# Patient Record
Sex: Male | Born: 1975
Health system: Southern US, Community
[De-identification: ages and names within clinical notes are randomized; demographics above are authoritative.]

## PROBLEM LIST (undated history)

## (undated) DIAGNOSIS — F1910 Other psychoactive substance abuse, uncomplicated: Secondary | ICD-10-CM

## (undated) DIAGNOSIS — K649 Unspecified hemorrhoids: Secondary | ICD-10-CM

## (undated) DIAGNOSIS — M1612 Unilateral primary osteoarthritis, left hip: Secondary | ICD-10-CM

## (undated) DIAGNOSIS — F4310 Post-traumatic stress disorder, unspecified: Secondary | ICD-10-CM

## (undated) DIAGNOSIS — F419 Anxiety disorder, unspecified: Secondary | ICD-10-CM

## (undated) DIAGNOSIS — F1110 Opioid abuse, uncomplicated: Secondary | ICD-10-CM

## (undated) DIAGNOSIS — G8929 Other chronic pain: Secondary | ICD-10-CM

## (undated) DIAGNOSIS — Z87898 Personal history of other specified conditions: Secondary | ICD-10-CM

## (undated) DIAGNOSIS — L749 Eccrine sweat disorder, unspecified: Secondary | ICD-10-CM

## (undated) DIAGNOSIS — M25559 Pain in unspecified hip: Secondary | ICD-10-CM

## (undated) DIAGNOSIS — M1990 Unspecified osteoarthritis, unspecified site: Secondary | ICD-10-CM

## (undated) DIAGNOSIS — I10 Essential (primary) hypertension: Secondary | ICD-10-CM

---

## 2004-08-29 ENCOUNTER — Emergency Department (HOSPITAL_COMMUNITY): Admission: AC | Admit: 2004-08-29 | Discharge: 2004-08-29 | Payer: Self-pay | Admitting: *Deleted

## 2009-12-26 ENCOUNTER — Emergency Department (HOSPITAL_BASED_OUTPATIENT_CLINIC_OR_DEPARTMENT_OTHER): Admission: EM | Admit: 2009-12-26 | Discharge: 2009-12-26 | Payer: Self-pay | Admitting: Emergency Medicine

## 2013-12-19 ENCOUNTER — Encounter (HOSPITAL_BASED_OUTPATIENT_CLINIC_OR_DEPARTMENT_OTHER): Payer: Self-pay | Admitting: Emergency Medicine

## 2013-12-19 ENCOUNTER — Emergency Department (HOSPITAL_BASED_OUTPATIENT_CLINIC_OR_DEPARTMENT_OTHER)
Admission: EM | Admit: 2013-12-19 | Discharge: 2013-12-19 | Disposition: A | Discharge status: Home / Self Care | Payer: Self-pay | Attending: Emergency Medicine | Admitting: Emergency Medicine

## 2013-12-19 DIAGNOSIS — K645 Perianal venous thrombosis: Secondary | ICD-10-CM | POA: Insufficient documentation

## 2013-12-19 DIAGNOSIS — F17200 Nicotine dependence, unspecified, uncomplicated: Secondary | ICD-10-CM | POA: Insufficient documentation

## 2013-12-19 DIAGNOSIS — Z880 Allergy status to penicillin: Secondary | ICD-10-CM | POA: Insufficient documentation

## 2013-12-19 MED ORDER — LIDOCAINE-EPINEPHRINE (PF) 2 %-1:200000 IJ SOLN
10.0000 mL | Freq: Once | INTRAMUSCULAR | Status: AC
  Administered 2013-12-19: 10 mL
  Filled 2013-12-19: qty 10

## 2013-12-19 MED ORDER — HYDROCODONE-ACETAMINOPHEN 5-325 MG PO TABS: 1.0000 | ORAL_TABLET | Freq: Four times a day (QID) | ORAL | Status: DC | PRN

## 2013-12-19 MED ORDER — LIDOCAINE (ANORECTAL) 5 % EX CREA: TOPICAL_CREAM | CUTANEOUS | Status: DC

## 2013-12-19 MED ORDER — LIDOCAINE HCL 4 % EX SOLN
CUTANEOUS | Status: DC | PRN
  Administered 2013-12-19: 04:00:00 via TOPICAL
  Filled 2013-12-19: qty 50

## 2013-12-19 NOTE — ED Notes (Signed)
Abscess on rectum x 4 days,  Half dollar side

## 2013-12-19 NOTE — ED Provider Notes (Signed)
CSN: [id number]     Arrival date & time 12/19/13  0225 History   First MD Initiated Contact with Patient 12/19/13 0256     Chief Complaint  Patient presents with  . Abscess   (Consider location/radiation/quality/duration/timing/severity/associated sxs/prior Treatment) HPI This is a 38 year old male with a four-day history of a tender perianal mass. It is worsening in size and is very tender. The pain is moderate to severe, worse with sitting or moving his bowels. The pain is severe enough this morning that made him vomit. He denies history of hemorrhoids in the past. He has had some chills but no fever.  History reviewed. No pertinent past medical history. History reviewed. No pertinent past surgical history. History reviewed. No pertinent family history. History  Substance Use Topics  . Smoking status: Current Every Day Smoker  . Smokeless tobacco: Not on file  . Alcohol Use: No    Review of Systems  All other systems reviewed and are negative.    Allergies  Penicillins  Home Medications  No current outpatient prescriptions on file. BP 183/79  Pulse 112  Temp(Src) 98.2 F (36.8 C) (Oral)  Resp 18  Ht 5\' 6"  (1.676 m)  Wt 280 lb (127.007 kg)  BMI 45.21 kg/m2  SpO2 100%  Physical Exam General: Well-developed, well-nourished male in no acute distress; appearance consistent with age of record HENT: normocephalic; atraumatic Eyes: pupils equal, round and reactive to light; extraocular muscles intact Neck: supple Heart: regular rate and rhythm Lungs: clear to auscultation bilaterally Abdomen: soft; nondistended; nontender; bowel sounds present Rectal: Thrombosed hemorrhoid at the 9:00 position, about 1.5 x 2 cm Extremities: No deformity; full range of motion Neurologic: Awake, alert and oriented; motor function intact in all extremities and symmetric; no facial droop Skin: Warm and dry Psychiatric: Anxious    ED Course  Procedures (including critical care  time)  INCISION AND DRAINAGE Performed by: Hanley SeamenMOLPUS,[employer] Null L Consent: Verbal consent obtained. Risks and benefits: risks, benefits and alternatives were discussed Type: Thrombosed hemorrhoid   Body area: Anus  Anesthesia: local infiltration  Local anesthetic: lidocaine 2 % with epinephrine  Anesthetic total: 3 ml  Incision was made with a scalpel, followed by removal of ovoid section of hemorrhoids roof with scissors  Complexity: simple Blunt dissection to break up loculations  Drainage: Clot   Drainage amount: Moderate   Packing material: None   Patient tolerance: Patient tolerated the procedure well with no immediate complications.     MDM      Hanley SeamenJohn L Rachael Ferrie, MD 12/19/13 [id number]

## 2013-12-19 NOTE — ED Notes (Signed)
Abscess on rectum x 4 days

## 2014-09-17 ENCOUNTER — Emergency Department (HOSPITAL_BASED_OUTPATIENT_CLINIC_OR_DEPARTMENT_OTHER): Payer: Self-pay

## 2014-09-17 ENCOUNTER — Encounter (HOSPITAL_BASED_OUTPATIENT_CLINIC_OR_DEPARTMENT_OTHER): Payer: Self-pay

## 2014-09-17 ENCOUNTER — Emergency Department (HOSPITAL_BASED_OUTPATIENT_CLINIC_OR_DEPARTMENT_OTHER)
Admission: EM | Admit: 2014-09-17 | Discharge: 2014-09-17 | Disposition: A | Discharge status: Home / Self Care | Payer: Self-pay | Attending: Emergency Medicine | Admitting: Emergency Medicine

## 2014-09-17 DIAGNOSIS — S99911A Unspecified injury of right ankle, initial encounter: Secondary | ICD-10-CM

## 2014-09-17 DIAGNOSIS — Y990 Civilian activity done for income or pay: Secondary | ICD-10-CM | POA: Insufficient documentation

## 2014-09-17 DIAGNOSIS — X58XXXA Exposure to other specified factors, initial encounter: Secondary | ICD-10-CM | POA: Insufficient documentation

## 2014-09-17 DIAGNOSIS — S93601A Unspecified sprain of right foot, initial encounter: Secondary | ICD-10-CM | POA: Insufficient documentation

## 2014-09-17 DIAGNOSIS — Z880 Allergy status to penicillin: Secondary | ICD-10-CM | POA: Insufficient documentation

## 2014-09-17 DIAGNOSIS — Y92321 Football field as the place of occurrence of the external cause: Secondary | ICD-10-CM | POA: Insufficient documentation

## 2014-09-17 DIAGNOSIS — Z720 Tobacco use: Secondary | ICD-10-CM | POA: Insufficient documentation

## 2014-09-17 DIAGNOSIS — Y9361 Activity, american tackle football: Secondary | ICD-10-CM | POA: Insufficient documentation

## 2014-09-17 DIAGNOSIS — S93401A Sprain of unspecified ligament of right ankle, initial encounter: Secondary | ICD-10-CM | POA: Insufficient documentation

## 2014-09-17 MED ORDER — HYDROCODONE-ACETAMINOPHEN 5-325 MG PO TABS: 1.0000 | ORAL_TABLET | ORAL | Status: DC | PRN

## 2014-09-17 NOTE — ED Notes (Signed)
Pt presented to the ED with a right ankle injury. Pt stated that he works for [employer] and was playing football with the kids, he went to catch a ball and came down on his foot wrong and stated that he heard a pop about a 3 times in his right ankle.

## 2014-09-17 NOTE — Discharge Instructions (Signed)
Rest.  Ice for 20 minutes every 2 hours while awake for the next 2 days.  Wear Ace bandage for comfort and support.  Follow-up with your primary Dr. If not improving in the next week.   Ankle Sprain An ankle sprain is an injury to the strong, fibrous tissues (ligaments) that hold the bones of your ankle joint together.  CAUSES An ankle sprain is usually caused by a fall or by twisting your ankle. Ankle sprains most commonly occur when you step on the outer edge of your foot, and your ankle turns inward. People who participate in sports are more prone to these types of injuries.  SYMPTOMS   Pain in your ankle. The pain may be present at rest or only when you are trying to stand or walk.  Swelling.  Bruising. Bruising may develop immediately or within 1 to 2 days after your injury.  Difficulty standing or walking, particularly when turning corners or changing directions. DIAGNOSIS  Your caregiver will ask you details about your injury and perform a physical exam of your ankle to determine if you have an ankle sprain. During the physical exam, your caregiver will press on and apply pressure to specific areas of your foot and ankle. Your caregiver will try to move your ankle in certain ways. An X-ray exam may be done to be sure a bone was not broken or a ligament did not separate from one of the bones in your ankle (avulsion fracture).  TREATMENT  Certain types of braces can help stabilize your ankle. Your caregiver can make a recommendation for this. Your caregiver may recommend the use of medicine for pain. If your sprain is severe, your caregiver may refer you to a surgeon who helps to restore function to parts of your skeletal system (orthopedist) or a physical therapist. HOME CARE INSTRUCTIONS   Apply ice to your injury for 1-2 days or as directed by your caregiver. Applying ice helps to reduce inflammation and pain.  Put ice in a plastic bag.  Place a towel between your skin and the  bag.  Leave the ice on for 15-20 minutes at a time, every 2 hours while you are awake.  Only take over-the-counter or prescription medicines for pain, discomfort, or fever as directed by your caregiver.  Elevate your injured ankle above the level of your heart as much as possible for 2-3 days.  If your caregiver recommends crutches, use them as instructed. Gradually put weight on the affected ankle. Continue to use crutches or a cane until you can walk without feeling pain in your ankle.  If you have a plaster splint, wear the splint as directed by your caregiver. Do not rest it on anything harder than a pillow for the first 24 hours. Do not put weight on it. Do not get it wet. You may take it off to take a shower or bath.  You may have been given an elastic bandage to wear around your ankle to provide support. If the elastic bandage is too tight (you have numbness or tingling in your foot or your foot becomes cold and blue), adjust the bandage to make it comfortable.  If you have an air splint, you may blow more air into it or let air out to make it more comfortable. You may take your splint off at night and before taking a shower or bath. Wiggle your toes in the splint several times per day to decrease swelling. SEEK MEDICAL CARE IF:   You  have rapidly increasing bruising or swelling.  Your toes feel extremely cold or you lose feeling in your foot.  Your pain is not relieved with medicine. SEEK IMMEDIATE MEDICAL CARE IF:  Your toes are numb or blue.  You have severe pain that is increasing. MAKE SURE YOU:   Understand these instructions.  Will watch your condition.  Will get help right away if you are not doing well or get worse. Document Released: 11/01/2005 Document Revised: 07/26/2012 Document Reviewed: 11/13/2011 [facility] Patient Information 2015 [place], [place]. This information is not intended to replace advice given to you by your health care provider. Make sure you  discuss any questions you have with your health care provider.  Foot Sprain The muscles and cord like structures which attach muscle to bone (tendons) that surround the feet are made up of units. A foot sprain can occur at the weakest spot in any of these units. This condition is most often caused by injury to or overuse of the foot, as from playing contact sports, or aggravating a previous injury, or from poor conditioning, or obesity. SYMPTOMS  Pain with movement of the foot.  Tenderness and swelling at the injury site.  Loss of strength is present in moderate or severe sprains. THE THREE GRADES OR SEVERITY OF FOOT SPRAIN ARE:  Mild (Grade I): Slightly pulled muscle without tearing of muscle or tendon fibers or loss of strength.  Moderate (Grade II): Tearing of fibers in a muscle, tendon, or at the attachment to bone, with small decrease in strength.  Severe (Grade III): Rupture of the muscle-tendon-bone attachment, with separation of fibers. Severe sprain requires surgical repair. Often repeating (chronic) sprains are caused by overuse. Sudden (acute) sprains are caused by direct injury or over-use. DIAGNOSIS  Diagnosis of this condition is usually by your own observation. If problems continue, a caregiver may be required for further evaluation and treatment. X-rays may be required to make sure there are not breaks in the bones (fractures) present. Continued problems may require physical therapy for treatment. PREVENTION  Use strength and conditioning exercises appropriate for your sport.  Warm up properly prior to working out.  Use athletic shoes that are made for the sport you are participating in.  Allow adequate time for healing. Early return to activities makes repeat injury more likely, and can lead to an unstable arthritic foot that can result in prolonged disability. Mild sprains generally heal in 3 to 10 days, with moderate and severe sprains taking 2 to 10 weeks. Your  caregiver can help you determine the proper time required for healing. HOME CARE INSTRUCTIONS   Apply ice to the injury for 15-20 minutes, 03-04 times per day. Put the ice in a plastic bag and place a towel between the bag of ice and your skin.  An elastic wrap (like an Ace bandage) may be used to keep swelling down.  Keep foot above the level of the heart, or at least raised on a footstool, when swelling and pain are present.  Try to avoid use other than gentle range of motion while the foot is painful. Do not resume use until instructed by your caregiver. Then begin use gradually, not increasing use to the point of pain. If pain does develop, decrease use and continue the above measures, gradually increasing activities that do not cause discomfort, until you gradually achieve normal use.  Use crutches if and as instructed, and for the length of time instructed.  Keep injured foot and ankle wrapped between  treatments.  Massage foot and ankle for comfort and to keep swelling down. Massage from the toes up towards the knee.  Only take over-the-counter or prescription medicines for pain, discomfort, or fever as directed by your caregiver. SEEK IMMEDIATE MEDICAL CARE IF:   Your pain and swelling increase, or pain is not controlled with medications.  You have loss of feeling in your foot or your foot turns cold or blue.  You develop new, unexplained symptoms, or an increase of the symptoms that brought you to your caregiver. MAKE SURE YOU:   Understand these instructions.  Will watch your condition.  Will get help right away if you are not doing well or get worse. Document Released: 04/23/2002 Document Revised: 01/24/2012 Document Reviewed: 06/20/2008 [facility] Patient Information 2015 [place], [place]. This information is not intended to replace advice given to you by your health care provider. Make sure you discuss any questions you have with your health care provider.

## 2014-09-17 NOTE — ED Provider Notes (Signed)
CSN: [id number]     Arrival date & time 09/17/14  1024 History   First MD Initiated Contact with Patient 09/17/14 1147     Chief Complaint  Patient presents with  . Ankle Injury     (Consider location/radiation/quality/duration/timing/severity/associated sxs/prior Treatment) HPI Comments: Patient is a 38 year old male who presents with complaints of right foot pain. He was playing football with children when he twisted his foot awkwardly. He states he heard a pop and has had difficulty ambulating since this time.  Patient is a 38 y.o. male presenting with lower extremity injury. The history is provided by the patient.  Ankle Injury This is a new problem. The current episode started 1 to 2 hours ago. The problem occurs constantly. The problem has not changed since onset.The symptoms are aggravated by walking. The symptoms are relieved by rest. He has tried nothing for the symptoms. The treatment provided no relief.    History reviewed. No pertinent past medical history. History reviewed. No pertinent past surgical history. History reviewed. No pertinent family history. History  Substance Use Topics  . Smoking status: Current Every Day Smoker -- 0.50 packs/day  . Smokeless tobacco: Never Used  . Alcohol Use: No    Review of Systems  All other systems reviewed and are negative.     Allergies  Penicillins  Home Medications   Prior to Admission medications   Not on File   BP 151/95 mmHg  Pulse 80  Temp(Src) 97.9 F (36.6 C) (Oral)  Resp 20  Ht 5\' 6"  (1.676 m)  Wt 280 lb (127.007 kg)  BMI 45.21 kg/m2  SpO2 97% Physical Exam  Constitutional: He is oriented to person, place, and time. He appears well-developed and well-nourished. No distress.  HENT:  Head: Normocephalic and atraumatic.  Mouth/Throat: Oropharynx is clear and moist.  Neck: Normal range of motion. Neck supple.  Musculoskeletal: Normal range of motion.  The right ankle and foot appear grossly normal.  There is no significant swelling or ecchymosis. He is tender to palpation on the soft tissues of the bottom of the foot. There is no tenderness to palpation of the medial or lateral malleolus and the ankle joint has good range of motion and stability.  Neurological: He is alert and oriented to person, place, and time.  Skin: Skin is warm and dry. He is not diaphoretic.  Nursing note and vitals reviewed.   ED Course  Procedures (including critical care time) Labs Review Labs Reviewed - No data to display  Imaging Review Dg Ankle Complete Right  09/17/2014   CLINICAL DATA:  Right ankle pain after playing football. Initial encounter  EXAM: RIGHT ANKLE - COMPLETE 3+ VIEW  COMPARISON:  None.  FINDINGS: Bone fragment at the medial malleolus appears corticated and chronic. No acute fracture suspected. No malalignment of the ankle. Negative hindfoot.  IMPRESSION: 1. No acute osseous findings. 2. Remote appearing medial malleolus avulsion fracture.   Electronically Signed   By: Tiburcio PeaJonathan  Watts M.D.   On: 09/17/2014 11:04     EKG Interpretation None      MDM   Final diagnoses:  Right ankle injury    X-rays are negative for fracture. Will treat with rest, ice, Ace, and when necessary follow-up.    Geoffery Lyonsouglas Vale Mousseau, MD 09/17/14 1236

## 2014-12-31 ENCOUNTER — Encounter (HOSPITAL_BASED_OUTPATIENT_CLINIC_OR_DEPARTMENT_OTHER): Payer: Self-pay | Admitting: Emergency Medicine

## 2014-12-31 ENCOUNTER — Emergency Department (HOSPITAL_BASED_OUTPATIENT_CLINIC_OR_DEPARTMENT_OTHER)
Admission: EM | Admit: 2014-12-31 | Discharge: 2014-12-31 | Discharge status: Against Medical Advice | Payer: 59 | Attending: Emergency Medicine | Admitting: Emergency Medicine

## 2014-12-31 DIAGNOSIS — Z720 Tobacco use: Secondary | ICD-10-CM | POA: Diagnosis not present

## 2014-12-31 DIAGNOSIS — K649 Unspecified hemorrhoids: Secondary | ICD-10-CM | POA: Diagnosis present

## 2014-12-31 HISTORY — DX: Unspecified hemorrhoids: K64.9
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
## 2014-12-31 NOTE — ED Notes (Signed)
Patient leaving, service recovery attempted.  Charge RN made aware.

## 2015-01-02 ENCOUNTER — Emergency Department (HOSPITAL_BASED_OUTPATIENT_CLINIC_OR_DEPARTMENT_OTHER)
Admission: EM | Admit: 2015-01-02 | Discharge: 2015-01-02 | Disposition: A | Discharge status: Home / Self Care | Payer: 59 | Attending: Emergency Medicine | Admitting: Emergency Medicine

## 2015-01-02 ENCOUNTER — Encounter (HOSPITAL_BASED_OUTPATIENT_CLINIC_OR_DEPARTMENT_OTHER): Payer: Self-pay

## 2015-01-02 DIAGNOSIS — Z880 Allergy status to penicillin: Secondary | ICD-10-CM | POA: Diagnosis not present

## 2015-01-02 DIAGNOSIS — K645 Perianal venous thrombosis: Secondary | ICD-10-CM | POA: Diagnosis not present

## 2015-01-02 DIAGNOSIS — Z720 Tobacco use: Secondary | ICD-10-CM | POA: Diagnosis not present

## 2015-01-02 DIAGNOSIS — K625 Hemorrhage of anus and rectum: Secondary | ICD-10-CM | POA: Diagnosis present

## 2015-01-02 DIAGNOSIS — R000 Tachycardia, unspecified: Secondary | ICD-10-CM | POA: Diagnosis not present

## 2015-01-02 LAB — CBC WITH DIFFERENTIAL/PLATELET
Basophils Absolute: 0 10*3/uL (ref 0.0–0.1)
Basophils Relative: 1 % (ref 0–1)
Eosinophils Absolute: 0.3 10*3/uL (ref 0.0–0.7)
Eosinophils Relative: 4 % (ref 0–5)
HCT: 40.4 % (ref 39.0–52.0)
Hemoglobin: 13.8 g/dL (ref 13.0–17.0)
Lymphocytes Relative: 42 % (ref 12–46)
Lymphs Abs: 3.4 10*3/uL (ref 0.7–4.0)
MCH: 29.2 pg (ref 26.0–34.0)
MCHC: 34.2 g/dL (ref 30.0–36.0)
MCV: 85.6 fL (ref 78.0–100.0)
Monocytes Absolute: 0.5 10*3/uL (ref 0.1–1.0)
Monocytes Relative: 6 % (ref 3–12)
Neutro Abs: 3.9 10*3/uL (ref 1.7–7.7)
Neutrophils Relative %: 47 % (ref 43–77)
Platelets: 272 10*3/uL (ref 150–400)
RBC: 4.72 MIL/uL (ref 4.22–5.81)
RDW: 12.9 % (ref 11.5–15.5)
WBC: 8.2 10*3/uL (ref 4.0–10.5)

## 2015-01-02 MED ORDER — HYDROCORTISONE 2.5 % RE CREA: TOPICAL_CREAM | RECTAL | Status: DC

## 2015-01-02 MED ORDER — LIDOCAINE-EPINEPHRINE 2 %-1:100000 IJ SOLN
20.0000 mL | Freq: Once | INTRAMUSCULAR | Status: DC
  Filled 2015-01-02: qty 1

## 2015-01-02 MED ORDER — DOCUSATE SODIUM 100 MG PO CAPS: 100.0000 mg | ORAL_CAPSULE | Freq: Two times a day (BID) | ORAL | Status: DC

## 2015-01-02 MED ORDER — OXYCODONE-ACETAMINOPHEN 5-325 MG PO TABS: 2.0000 | ORAL_TABLET | ORAL | Status: DC | PRN

## 2015-01-02 MED ORDER — HYDROMORPHONE HCL 1 MG/ML IJ SOLN
1.0000 mg | Freq: Once | INTRAMUSCULAR | Status: AC
  Administered 2015-01-02: 1 mg via INTRAMUSCULAR
  Filled 2015-01-02: qty 1

## 2015-01-02 NOTE — ED Notes (Signed)
Pt has returned to ED for treatment.

## 2015-01-02 NOTE — ED Provider Notes (Signed)
CSN: [id number]     Arrival date & time 01/02/15  1747 History  This chart was scribe for No att. providers found by Angelene GiovanniEmmanuella Mensah, ED Scribe. The patient was seen in room MH01/MH01 and the patient's care was started at 6:22PM.    Chief Complaint  Patient presents with  . Rectal Bleeding   The history is provided by the patient. No language interpreter was used.   HPI Comments: Joshua Jacobs is a 39 y.o. male with a hx of hemorrhoids who presents to the Emergency Department complaining of rectal bleeding onset 2 days ago. He reports associated hemorrhoids and states that he was using salt to soak it and it burst. He reports a gradually worsening pain and bleeding as well as blood clots from the area. He denies constipation and straining. He denies dizziness and lightheadeness. He reports putting on a hemorrhoid cream and Lidocaine to numb it. He denies going to see a surgeon about his hemorrhoids. He reports an allergy to Penicillin.    Past Medical History  Diagnosis Date  . Hemorrhoids    History reviewed. No pertinent past surgical history. No family history on file. History  Substance Use Topics  . Smoking status: Current Every Day Smoker -- 0.50 packs/day  . Smokeless tobacco: Never Used  . Alcohol Use: No    Review of Systems  Gastrointestinal: Positive for anal bleeding. Negative for constipation.  Neurological: Negative for light-headedness.  All other systems reviewed and are negative.     Allergies  Penicillins  Home Medications   Prior to Admission medications   Medication Sig Start Date End Date Taking? Authorizing Provider  docusate sodium (COLACE) 100 MG capsule Take 1 capsule (100 mg total) by mouth every 12 (twelve) hours. 01/02/15   Glynn OctaveStephen Tomasita Beevers, MD  hydrocortisone (ANUSOL-HC) 2.5 % rectal cream Apply rectally 2 times daily 01/02/15   Glynn OctaveStephen Kielee Care, MD  oxyCODONE-acetaminophen (PERCOCET/ROXICET) 5-325 MG per tablet Take 2 tablets by mouth every 4  (four) hours as needed for severe pain. 01/02/15   Glynn OctaveStephen Shakeia Krus, MD   BP 129/90 mmHg  Pulse 85  Temp(Src) 98.4 F (36.9 C) (Oral)  Resp 18  Ht 5\' 6"  (1.676 m)  Wt 275 lb (124.739 kg)  BMI 44.41 kg/m2  SpO2 100% Physical Exam  Constitutional: He is oriented to person, place, and time. He appears well-developed and well-nourished. No distress.  HENT:  Head: Normocephalic and atraumatic.  Mouth/Throat: Oropharynx is clear and moist. No oropharyngeal exudate.  Eyes: Conjunctivae and EOM are normal. Pupils are equal, round, and reactive to light.  Neck: Normal range of motion. Neck supple.  No meningismus.  Cardiovascular: Intact distal pulses.   No murmur heard. Tachycardiac  Pulmonary/Chest: Effort normal and breath sounds normal. No respiratory distress.  Abdominal: Soft. There is no tenderness. There is no rebound and no guarding.  Genitourinary:  Thrombosis hemorrhopids about 1'oclock, 3'oclock about 1.5 cm each. The one at [facility]'oclock has dark blood from pt's manipulation of it.   Musculoskeletal: Normal range of motion. He exhibits no edema or tenderness.  Neurological: He is alert and oriented to person, place, and time. No cranial nerve deficit. He exhibits normal muscle tone. Coordination normal.  No ataxia on finger to nose bilaterally. No pronator drift. 5/5 strength throughout. CN 2-12 intact. Negative Romberg. Equal grip strength. Sensation intact. Gait is normal.   Skin: Skin is warm.  Psychiatric: He has a normal mood and affect. His behavior is normal.  Nursing note and vitals reviewed.  ED Course  INCISION AND DRAINAGE Date/Time: 01/02/2015 7:30 PM Performed by: Glynn Octave Authorized by: Glynn Octave Consent: Verbal consent obtained. Risks and benefits: risks, benefits and alternatives were discussed Consent given by: patient Patient understanding: patient states understanding of the procedure being performed Patient consent: the patient's  understanding of the procedure matches consent given Procedure consent: procedure consent matches procedure scheduled Relevant documents: relevant documents present and verified Test results: test results available and properly labeled Site marked: the operative site was marked Imaging studies: imaging studies available Patient identity confirmed: verbally with patient and provided demographic data Type: hematoma Body area: anogenital Location details: perianal Anesthesia: local infiltration Local anesthetic: lidocaine 1% with epinephrine Anesthetic total: 4 ml Patient sedated: no Scalpel size: 11 Needle gauge: 18 Incision type: elliptical Complexity: complex Drainage: bloody Drainage amount: moderate Wound treatment: wound left open Patient tolerance: Patient tolerated the procedure well with no immediate complications  INCISION AND DRAINAGE Date/Time: 01/02/2015 7:31 PM Performed by: Glynn Octave Authorized by: Glynn Octave Consent: Verbal consent obtained. Risks and benefits: risks, benefits and alternatives were discussed Consent given by: patient Patient understanding: patient states understanding of the procedure being performed Patient consent: the patient's understanding of the procedure matches consent given Procedure consent: procedure consent matches procedure scheduled Relevant documents: relevant documents present and verified Test results: test results available and properly labeled Site marked: the operative site was marked Imaging studies: imaging studies available Patient identity confirmed: verbally with patient and provided demographic data Time out: Immediately prior to procedure a "time out" was called to verify the correct patient, procedure, equipment, support staff and site/side marked as required. Type: hematoma Body area: anogenital Location details: perianal Anesthesia: local infiltration Local anesthetic: lidocaine 1% with  epinephrine Anesthetic total: 4 ml Patient sedated: no Scalpel size: 11 Incision type: elliptical Complexity: complex Drainage: bloody Drainage amount: moderate Wound treatment: wound left open Patient tolerance: Patient tolerated the procedure well with no immediate complications Comments: Incision and drainage of 2 thrombosed external hemorrhoids with the deroofing of skin   (including critical care time) DIAGNOSTIC STUDIES: Oxygen Saturation is 96% on RA, adequate by my interpretation.    COORDINATION OF CARE: 6:29 PM- Pt advised of plan for treatment and pt agrees.    Labs Review Labs Reviewed  CBC WITH DIFFERENTIAL/PLATELET    Imaging Review No results found.   EKG Interpretation None      MDM   Final diagnoses:  Thrombosed external hemorrhoids   Thrombosed external hemorrhoids. Abdomen soft and nontender.  Hemorrhoids in size as above. Patient given pain medication, stool softeners, steroid cream. Referral to surgery given.   I personally performed the services described in this documentation, which was scribed in my presence. The recorded information has been reviewed and is accurate.     Glynn Octave, MD 01/03/15 0010
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
## 2015-01-02 NOTE — ED Notes (Signed)
Pt states he had hemorrhoid bleeding x 2 days-presented to the ED 2 days ago and left due to wait-pt NAD

## 2015-01-02 NOTE — Discharge Instructions (Signed)
Hemorrhoids Use the stool softeners and pain medication as prescribed. Follow up with the surgery clinic. Return to the ED if you develop new or worsening symptoms. Hemorrhoids are swollen veins around the rectum or anus. There are two types of hemorrhoids:   Internal hemorrhoids. These occur in the veins just inside the rectum. They may poke through to the outside and become irritated and painful.  External hemorrhoids. These occur in the veins outside the anus and can be felt as a painful swelling or hard lump near the anus. CAUSES  Pregnancy.   Obesity.   Constipation or diarrhea.   Straining to have a bowel movement.   Sitting for long periods on the toilet.  Heavy lifting or other activity that caused you to strain.  Anal intercourse. SYMPTOMS   Pain.   Anal itching or irritation.   Rectal bleeding.   Fecal leakage.   Anal swelling.   One or more lumps around the anus.  DIAGNOSIS  Your caregiver may be able to diagnose hemorrhoids by visual examination. Other examinations or tests that may be performed include:   Examination of the rectal area with a gloved hand (digital rectal exam).   Examination of anal canal using a small tube (scope).   A blood test if you have lost a significant amount of blood.  A test to look inside the colon (sigmoidoscopy or colonoscopy). TREATMENT Most hemorrhoids can be treated at home. However, if symptoms do not seem to be getting better or if you have a lot of rectal bleeding, your caregiver may perform a procedure to help make the hemorrhoids get smaller or remove them completely. Possible treatments include:   Placing a rubber band at the base of the hemorrhoid to cut off the circulation (rubber band ligation).   Injecting a chemical to shrink the hemorrhoid (sclerotherapy).   Using a tool to burn the hemorrhoid (infrared light therapy).   Surgically removing the hemorrhoid (hemorrhoidectomy).   Stapling  the hemorrhoid to block blood flow to the tissue (hemorrhoid stapling).  HOME CARE INSTRUCTIONS   Eat foods with fiber, such as whole grains, beans, nuts, fruits, and vegetables. Ask your doctor about taking products with added fiber in them (fibersupplements).  Increase fluid intake. Drink enough water and fluids to keep your urine clear or pale yellow.   Exercise regularly.   Go to the bathroom when you have the urge to have a bowel movement. Do not wait.   Avoid straining to have bowel movements.   Keep the anal area dry and clean. Use wet toilet paper or moist towelettes after a bowel movement.   Medicated creams and suppositories may be used or applied as directed.   Only take over-the-counter or prescription medicines as directed by your caregiver.   Take warm sitz baths for 15-20 minutes, 3-4 times a day to ease pain and discomfort.   Place ice packs on the hemorrhoids if they are tender and swollen. Using ice packs between sitz baths may be helpful.   Put ice in a plastic bag.   Place a towel between your skin and the bag.   Leave the ice on for 15-20 minutes, 3-4 times a day.   Do not use a donut-shaped pillow or sit on the toilet for long periods. This increases blood pooling and pain.  SEEK MEDICAL CARE IF:  You have increasing pain and swelling that is not controlled by treatment or medicine.  You have uncontrolled bleeding.  You have difficulty or you  are unable to have a bowel movement.  You have pain or inflammation outside the area of the hemorrhoids. MAKE SURE YOU:  Understand these instructions.  Will watch your condition.  Will get help right away if you are not doing well or get worse. Document Released: 10/29/2000 Document Revised: 10/18/2012 Document Reviewed: 09/05/2012 [facility] Patient Information 2015 [place], [place]. This information is not intended to replace advice given to you by your health care provider. Make sure you  discuss any questions you have with your health care provider.

## 2015-01-04 ENCOUNTER — Encounter (HOSPITAL_BASED_OUTPATIENT_CLINIC_OR_DEPARTMENT_OTHER): Payer: Self-pay | Admitting: *Deleted

## 2015-01-04 ENCOUNTER — Emergency Department (HOSPITAL_BASED_OUTPATIENT_CLINIC_OR_DEPARTMENT_OTHER)
Admission: EM | Admit: 2015-01-04 | Discharge: 2015-01-04 | Disposition: A | Discharge status: Home / Self Care | Payer: 59 | Attending: Emergency Medicine | Admitting: Emergency Medicine

## 2015-01-04 DIAGNOSIS — K6289 Other specified diseases of anus and rectum: Secondary | ICD-10-CM

## 2015-01-04 DIAGNOSIS — Z880 Allergy status to penicillin: Secondary | ICD-10-CM | POA: Diagnosis not present

## 2015-01-04 DIAGNOSIS — Z7952 Long term (current) use of systemic steroids: Secondary | ICD-10-CM | POA: Diagnosis not present

## 2015-01-04 DIAGNOSIS — Z720 Tobacco use: Secondary | ICD-10-CM | POA: Insufficient documentation

## 2015-01-04 DIAGNOSIS — K644 Residual hemorrhoidal skin tags: Secondary | ICD-10-CM | POA: Diagnosis not present

## 2015-01-04 DIAGNOSIS — Z760 Encounter for issue of repeat prescription: Secondary | ICD-10-CM | POA: Diagnosis present

## 2015-01-04 MED ORDER — OXYCODONE-ACETAMINOPHEN 5-325 MG PO TABS: 1.0000 | ORAL_TABLET | Freq: Four times a day (QID) | ORAL | Status: DC | PRN

## 2015-01-04 NOTE — Discharge Instructions (Signed)

## 2015-01-04 NOTE — ED Provider Notes (Signed)
CSN: [id number]     Arrival date & time 01/04/15  4098 History  This chart was scribed for Toy Cookey, MD by Jarvis Morgan, ED Scribe. This patient was seen in room MHT13/MHT13 and the patient's care was started at 10:20 PM.    Chief Complaint  Patient presents with  . Medication Refill    The history is provided by the patient. No language interpreter was used.    HPI Comments: Joshua Jacobs is a 39 y.o. male who presents to the Emergency Department for a refill of his oxycodone pain medication. Pt was in the ED on 01/02/15 for a thrombosed hemorrhoid and had it incised and drained. He was given pain medication but states he has ran out. Besides the pain medication he has also been using stool softener, Epson salt baths and hydrocortisone rectal cream. He states he is still having pain despite other treatments. Pt has an upcoming appt next week with general surgery for f/u of the hemorrhoid and would like some pain meds to hold him over until he can see his doctor. He denies any fevers or chills.    Past Medical History  Diagnosis Date  . Hemorrhoids    History reviewed. No pertinent past surgical history. No family history on file. History  Substance Use Topics  . Smoking status: Current Every Day Smoker -- 0.50 packs/day  . Smokeless tobacco: Never Used  . Alcohol Use: No    Review of Systems  Constitutional: Negative for fever, chills, activity change, appetite change and fatigue.  HENT: Negative for congestion, facial swelling, rhinorrhea and trouble swallowing.   Eyes: Negative for photophobia and pain.  Respiratory: Negative for cough, chest tightness and shortness of breath.   Cardiovascular: Negative for chest pain and leg swelling.  Gastrointestinal: Positive for rectal pain (due to hemorrhoids). Negative for nausea, vomiting, abdominal pain, diarrhea and constipation.  Endocrine: Negative for polydipsia and polyuria.  Genitourinary: Negative for dysuria, urgency,  decreased urine volume and difficulty urinating.  Musculoskeletal: Negative for back pain and gait problem.  Skin: Negative for color change, rash and wound.  Allergic/Immunologic: Negative for immunocompromised state.  Neurological: Negative for dizziness, facial asymmetry, speech difficulty, weakness, numbness and headaches.  Psychiatric/Behavioral: Negative for confusion, decreased concentration and agitation.      Allergies  Penicillins  Home Medications   Prior to Admission medications   Medication Sig Start Date End Date Taking? Authorizing Provider  docusate sodium (COLACE) 100 MG capsule Take 1 capsule (100 mg total) by mouth every 12 (twelve) hours. 01/02/15   Glynn Octave, MD  hydrocortisone (ANUSOL-HC) 2.5 % rectal cream Apply rectally 2 times daily 01/02/15   Glynn Octave, MD  oxyCODONE-acetaminophen (PERCOCET) 5-325 MG per tablet Take 1-2 tablets by mouth every 6 (six) hours as needed. 01/04/15   Toy Cookey, MD   Triage Vitals: BP 140/88 mmHg  Pulse 83  Temp(Src) 98.4 F (36.9 C) (Oral)  Resp 20  Ht  (1.676 m)  Wt 275 lb (124.739 kg)  BMI 44.41 kg/m2  SpO2 95%  Physical Exam  Constitutional: He is oriented to person, place, and time. He appears well-developed and well-nourished. No distress.  HENT:  Head: Normocephalic and atraumatic.  Mouth/Throat: No oropharyngeal exudate.  Eyes: Pupils are equal, round, and reactive to light.  Neck: Normal range of motion. Neck supple.  Cardiovascular: Normal rate, regular rhythm and normal heart sounds.  Exam reveals no gallop and no friction rub.   No murmur heard. Pulmonary/Chest: Effort normal and breath  sounds normal. No respiratory distress. He has no wheezes. He has no rales.  Abdominal: Soft. Bowel sounds are normal. He exhibits no distension and no mass. There is no tenderness. There is no rebound and no guarding.  Genitourinary:  Incised thrombosed hemorrhoid is well appearing. He has 2nd small non  thrombosed hemorrhoid.   Musculoskeletal: Normal range of motion. He exhibits no edema or tenderness.  Neurological: He is alert and oriented to person, place, and time.  Skin: Skin is warm and dry.  Psychiatric: He has a normal mood and affect.    ED Course  Procedures (including critical care time)  DIAGNOSTIC STUDIES: Oxygen Saturation is 95% on RA, normal by my interpretation.    COORDINATION OF CARE:    Labs Review Labs Reviewed - No data to display  Imaging Review No results found.   EKG Interpretation None      MDM   Final diagnoses:  Rectal pain  External hemorrhoids    Pt is a 39 y.o. male with Pmhx as above who presents with continued rectal pain after I&D of thrombosed hemorrhoid.  Several days ago.  Patient has follow-up appointment on this coming Thursday with general surgery in [facility] of his run out of his pain meds.  He has continued to use his hemorrhoid cream and sits baths.  On physical exam he has a new external hemorrhoid that is not thrombosed.  Thrombosed hemorrhoid appears to be healing well.  We'll refill Norco.     Janetta HoraSteven Levels evaluation in the Emergency Department is complete. It has been determined that no acute conditions requiring further emergency intervention are present at this time. The patient/guardian have been advised of the diagnosis and plan. We have discussed signs and symptoms that warrant return to the ED, such as changes or worsening in symptoms, fevers, worsening pain, redness or swelling   I personally performed the services described in this documentation, which was scribed in my presence. The recorded information has been reviewed and is accurate.      Toy CookeyMegan Docherty, MD 01/06/15 [phone]

## 2015-01-04 NOTE — ED Notes (Signed)
Pt had a thrombosed hemorrhoid cut on 2/18 and has ran out of pain medication.  Pt continues to have pain despite epsom salt soaks.  Pt would like refill of pain medication

## 2015-06-10 ENCOUNTER — Encounter (HOSPITAL_BASED_OUTPATIENT_CLINIC_OR_DEPARTMENT_OTHER): Payer: Self-pay

## 2015-06-10 ENCOUNTER — Emergency Department (HOSPITAL_BASED_OUTPATIENT_CLINIC_OR_DEPARTMENT_OTHER)
Admission: EM | Admit: 2015-06-10 | Discharge: 2015-06-10 | Disposition: A | Discharge status: Home / Self Care | Payer: 59 | Attending: Emergency Medicine | Admitting: Emergency Medicine

## 2015-06-10 DIAGNOSIS — Z720 Tobacco use: Secondary | ICD-10-CM | POA: Insufficient documentation

## 2015-06-10 DIAGNOSIS — Z880 Allergy status to penicillin: Secondary | ICD-10-CM | POA: Insufficient documentation

## 2015-06-10 DIAGNOSIS — L02413 Cutaneous abscess of right upper limb: Secondary | ICD-10-CM | POA: Insufficient documentation

## 2015-06-10 DIAGNOSIS — Z8709 Personal history of other diseases of the respiratory system: Secondary | ICD-10-CM | POA: Insufficient documentation

## 2015-06-10 MED ORDER — LIDOCAINE-EPINEPHRINE 2 %-1:100000 IJ SOLN
1.7000 mL | Freq: Once | INTRAMUSCULAR | Status: AC
  Administered 2015-06-10: 1.7 mL via INTRADERMAL
  Filled 2015-06-10: qty 1

## 2015-06-10 MED ORDER — SULFAMETHOXAZOLE-TRIMETHOPRIM 800-160 MG PO TABS: 1.0000 | ORAL_TABLET | Freq: Two times a day (BID) | ORAL | Status: AC

## 2015-06-10 NOTE — Discharge Instructions (Signed)
Abscess °Care After °An abscess (also called a boil or furuncle) is an infected area that contains a collection of pus. Signs and symptoms of an abscess include pain, tenderness, redness, or hardness, or you may feel a moveable soft area under your skin. An abscess can occur anywhere in the body. The infection may spread to surrounding tissues causing cellulitis. A cut (incision) by the surgeon was made over your abscess and the pus was drained out. Gauze may have been packed into the space to provide a drain that will allow the cavity to heal from the inside outwards. The boil may be painful for 5 to 7 days. Most people with a boil do not have high fevers. Your abscess, if seen early, may not have localized, and may not have been lanced. If not, another appointment may be required for this if it does not get better on its own or with medications. °HOME CARE INSTRUCTIONS  °· Only take over-the-counter or prescription medicines for pain, discomfort, or fever as directed by your caregiver. °· When you bathe, soak and then remove gauze or iodoform packs at least daily or as directed by your caregiver. You may then wash the wound gently with mild soapy water. Repack with gauze or do as your caregiver directs. °SEEK IMMEDIATE MEDICAL CARE IF:  °· You develop increased pain, swelling, redness, drainage, or bleeding in the wound site. °· You develop signs of generalized infection including muscle aches, chills, fever, or a general ill feeling. °· An oral temperature above 102° F (38.9° C) develops, not controlled by medication. °See your caregiver for a recheck if you develop any of the symptoms described above. If medications (antibiotics) were prescribed, take them as directed. °Document Released: 05/20/2005 Document Revised: 01/24/2012 Document Reviewed: 01/15/2008 °Exit[employer]® Patient Information ©2015 [employer]. This information is not intended to replace advice given to you by your health care provider. Make sure  you discuss any questions you have with your health care provider. ° °Abscess °An abscess is an infected area that contains a collection of pus and debris. It can occur in almost any part of the body. An abscess is also known as a furuncle or boil. °CAUSES  °An abscess occurs when tissue gets infected. This can occur from blockage of oil or sweat glands, infection of hair follicles, or a minor injury to the skin. As the body tries to fight the infection, pus collects in the area and creates pressure under the skin. This pressure causes pain. People with weakened immune systems have difficulty fighting infections and get certain abscesses more often.  °SYMPTOMS °Usually an abscess develops on the skin and becomes a painful mass that is red, warm, and tender. If the abscess forms under the skin, you may feel a moveable soft area under the skin. Some abscesses break open (rupture) on their own, but most will continue to get worse without care. The infection can spread deeper into the body and eventually into the bloodstream, causing you to feel ill.  °DIAGNOSIS  °Your caregiver will take your medical history and perform a physical exam. A sample of fluid may also be taken from the abscess to determine what is causing your infection. °TREATMENT  °Your caregiver may prescribe antibiotic medicines to fight the infection. However, taking antibiotics alone usually does not cure an abscess. Your caregiver may need to make a small cut (incision) in the abscess to drain the pus. In some cases, gauze is packed into the abscess to reduce pain and to   continue draining the area. °HOME CARE INSTRUCTIONS  °· Only take over-the-counter or prescription medicines for pain, discomfort, or fever as directed by your caregiver. °· If you were prescribed antibiotics, take them as directed. Finish them even if you start to feel better. °· If gauze is used, follow your caregiver's directions for changing the gauze. °· To avoid spreading the  infection: °¨ Keep your draining abscess covered with a bandage. °¨ Wash your hands well. °¨ Do not share personal care items, towels, or whirlpools with others. °¨ Avoid skin contact with others. °· Keep your skin and clothes clean around the abscess. °· Keep all follow-up appointments as directed by your caregiver. °SEEK MEDICAL CARE IF:  °· You have increased pain, swelling, redness, fluid drainage, or bleeding. °· You have muscle aches, chills, or a general ill feeling. °· You have a fever. °MAKE SURE YOU:  °· Understand these instructions. °· Will watch your condition. °· Will get help right away if you are not doing well or get worse. °Document Released: 08/11/2005 Document Revised: 05/02/2012 Document Reviewed: 01/14/2012 °ExitCare® Patient Information ©2015 [employer]. This information is not intended to replace advice given to you by your health care provider. Make sure you discuss any questions you have with your health care provider. ° °

## 2015-06-10 NOTE — ED Notes (Signed)
Pt reports he gave plasma 7/22-left AC site was painful-was d/c'ed and IV restarted right forearm-c/o pain/swelling to both sites-pt states he attempted to drain right forearm by sticking a needle at swelling site

## 2015-06-10 NOTE — ED Notes (Signed)
MD at bedside. 

## 2015-06-10 NOTE — ED Provider Notes (Signed)
CSN: [id number]     Arrival date & time 06/10/15  1332 History   First MD Initiated Contact with Patient 06/10/15 1339     Chief Complaint  Patient presents with  . IV site issues      (Consider location/radiation/quality/duration/timing/severity/associated sxs/prior Treatment) HPI Comments: Patient 4 days ago was getting plasma when his IV stopped working in the left arm. They remove the IV and started an IV in his right forearm and gave him saline. When he got home he noticed the site on the right arm started to swell and become tender. 2 days ago it started to become red. He attempted to drain it with putting a needle in at home but only got some blood. He has had some chills but denies fever, nausea, vomiting. Also noticed a hard bump in the left arm where they remove the IV but no redness or swelling.  The history is provided by the patient.    Past Medical History  Diagnosis Date  . Hemorrhoids    History reviewed. No pertinent past surgical history. No family history on file. History  Substance Use Topics  . Smoking status: Current Every Day Smoker -- 0.50 packs/day  . Smokeless tobacco: Never Used  . Alcohol Use: No    Review of Systems  Constitutional: Positive for chills.  All other systems reviewed and are negative.     Allergies  Penicillins  Home Medications   Prior to Admission medications   Medication Sig Start Date End Date Taking? Authorizing Provider  sulfamethoxazole-trimethoprim (BACTRIM DS,SEPTRA DS) 800-160 MG per tablet Take 1 tablet by mouth 2 (two) times daily. 06/10/15 06/17/15  Gwyneth Sprout, MD   BP 146/81 mmHg  Pulse 94  Temp(Src) 98.2 F (36.8 C) (Oral)  Resp 16  Ht  (1.676 m)  Wt 260 lb (117.935 kg)  BMI 41.99 kg/m2  SpO2 99% Physical Exam  Constitutional: He is oriented to person, place, and time. He appears well-developed and well-nourished. No distress.  HENT:  Head: Normocephalic and atraumatic.  Cardiovascular: Normal  rate.   Pulmonary/Chest: Effort normal.  Neurological: He is alert and oriented to person, place, and time.  Skin: Skin is warm and dry.     Psychiatric: He has a normal mood and affect. His behavior is normal.  Nursing note and vitals reviewed.   ED Course  Procedures (including critical care time) Labs Review Labs Reviewed - No data to display  Imaging Review No results found.   EKG Interpretation None      INCISION AND DRAINAGE Performed by: Gwyneth Sprout Consent: Verbal consent obtained. Risks and benefits: risks, benefits and alternatives were discussed Type: abscess  Body area: Right ventral forearm  Anesthesia: local infiltration  Incision was made with a scalpel.  Local anesthetic: lidocaine 2 % with epinephrine  Anesthetic total: 3 ml  Complexity: complex Blunt dissection to break up loculations  Drainage: purulent  Drainage amount: 5 mL   Packing material: none  Patient tolerance: Patient tolerated the procedure well with no immediate complications.     MDM   Final diagnoses:  Abscess of forearm, right   patient with an abscess of the right forearm after getting an IV stick at the plasma center and saline infusion. Appears to be an infected hematoma. Placed on Bactrim for surrounding cellulitis.    Gwyneth Sprout, MD 06/10/15 [phone]

## 2016-05-31 DIAGNOSIS — M25551 Pain in right hip: Secondary | ICD-10-CM | POA: Insufficient documentation

## 2016-05-31 DIAGNOSIS — M25552 Pain in left hip: Secondary | ICD-10-CM | POA: Insufficient documentation

## 2016-09-17 ENCOUNTER — Encounter (HOSPITAL_COMMUNITY): Payer: Self-pay | Admitting: Emergency Medicine

## 2016-09-17 ENCOUNTER — Emergency Department (HOSPITAL_COMMUNITY)
Admission: EM | Admit: 2016-09-17 | Discharge: 2016-09-17 | Discharge status: Against Medical Advice | Payer: 59 | Attending: Emergency Medicine | Admitting: Emergency Medicine

## 2016-09-17 DIAGNOSIS — F17200 Nicotine dependence, unspecified, uncomplicated: Secondary | ICD-10-CM | POA: Insufficient documentation

## 2016-09-17 DIAGNOSIS — M545 Low back pain, unspecified: Secondary | ICD-10-CM

## 2016-09-17 LAB — CBC WITH DIFFERENTIAL/PLATELET
Basophils Absolute: 0 10*3/uL (ref 0.0–0.1)
Basophils Relative: 0 %
EOS ABS: 0.2 10*3/uL (ref 0.0–0.7)
EOS PCT: 3 %
HCT: 39.5 % (ref 39.0–52.0)
Hemoglobin: 13.2 g/dL (ref 13.0–17.0)
LYMPHS ABS: 2.5 10*3/uL (ref 0.7–4.0)
LYMPHS PCT: 33 %
MCH: 27.6 pg (ref 26.0–34.0)
MCHC: 33.4 g/dL (ref 30.0–36.0)
MCV: 82.6 fL (ref 78.0–100.0)
MONOS PCT: 7 %
Monocytes Absolute: 0.5 10*3/uL (ref 0.1–1.0)
Neutro Abs: 4.2 10*3/uL (ref 1.7–7.7)
Neutrophils Relative %: 57 %
PLATELETS: 288 10*3/uL (ref 150–400)
RBC: 4.78 MIL/uL (ref 4.22–5.81)
RDW: 13.4 % (ref 11.5–15.5)
WBC: 7.5 10*3/uL (ref 4.0–10.5)

## 2016-09-17 LAB — BASIC METABOLIC PANEL
Anion gap: 6 (ref 5–15)
BUN: 8 mg/dL (ref 6–20)
CO2: 26 mmol/L (ref 22–32)
Calcium: 9.3 mg/dL (ref 8.9–10.3)
Chloride: 108 mmol/L (ref 101–111)
Creatinine, Ser: 0.89 mg/dL (ref 0.61–1.24)
GFR calc Af Amer: >60 mL/min (ref >60)
GFR calc non Af Amer: >60 mL/min (ref >60)
Glucose, Bld: 106 mg/dL — ABNORMAL HIGH (ref 65–99)
Potassium: 3.8 mmol/L (ref 3.5–5.1)
Sodium: 140 mmol/L (ref 135–145)

## 2016-09-17 MED ORDER — OXYCODONE-ACETAMINOPHEN 5-325 MG PO TABS
1.0000 | ORAL_TABLET | Freq: Once | ORAL | Status: AC
  Administered 2016-09-17: 1 via ORAL
  Filled 2016-09-17: qty 1

## 2016-09-17 MED ORDER — METHOCARBAMOL 500 MG PO TABS
1000.0000 mg | ORAL_TABLET | Freq: Once | ORAL | Status: AC
  Administered 2016-09-17: 1000 mg via ORAL
  Filled 2016-09-17: qty 2

## 2016-09-17 NOTE — ED Triage Notes (Signed)
Pt states that he has had chronic rt hip pain x several months.  States that 3 days ago, pt started having low back pain, more on the right side.  Pt states that he has not had any injuries and pain is worse with movement.  No difficulty urinating but states that the pain has been so bad that he has vomited.  Also reports rt shoulder pain that started at the same time.

## 2016-09-17 NOTE — Progress Notes (Addendum)
Pt confirms with ED CM that he does not have a pcp Pt listed with united health care insurance that was rejected therefore indicating no coverage  Pt confirms he has no coverage and confirms no longer with  [place] CM spoke with [place] who [place].  CM discussed and provided written information to assist pt with determining choice for uninsured accepting pcps, discussed the importance of pcp vs EDP services for f/u care, www.needymeds.org, www.goodrx.com, discounted pharmacies and other [place] resources such as [facility] , [facility], affordable care act, financial assistance, uninsured dental services, [place] med assist, DSS and  health department  Reviewed resources for [place] uninsured accepting pcps like [facility], [facility] [address], [facility], [facility], local urgent care centers, [facility], [facility], general medical clinics, [facility] services of the [place], [facility] plus others, medication resources, [facility] out patient pharmacies and housing Pt voiced understanding and appreciation of resources provided   Provided [facility] contact information Pt agreed to a referral Cm completed referral Pt to be contact by [facility] clinical liaison Left resources in pt belonging bag on bedside chair in ED rm #[phone]

## 2016-09-17 NOTE — ED Notes (Signed)
Unable to get MRI done till around 2000-PA notified

## 2016-09-17 NOTE — ED Notes (Signed)
Pt reports he needed to pick up his child immediately. Pt left AMA.  PA informed.

## 2016-09-17 NOTE — ED Provider Notes (Signed)
[facility]-EMERGENCY DEPT Provider Note   CSN: [id number] Arrival date & time: 09/17/16  1400     History   Chief Complaint Chief Complaint  Patient presents with  . Back Pain  . Hip Pain  . Shoulder Pain    HPI Joshua Jacobs is a 40 y.o. male.  The history is provided by the patient and medical records. No language interpreter was used.   Joshua Jacobs is a 40 y.o. male  who presents to the Emergency Department complaining of acute onset of midline and right-sided low back pain x 3 days. No injury or increase in activity. He has tried heating pad and Epson salt  with little relief. He takes Meloxicam daily as well. He tried one of his friend's Percocet which provided relief for approximately 10 minutes but then pain returned. He endorses associated subjective fever and night sweats for the last 2-3 days. He states that when he walks, it is so painful that he becomes nauseous and will throw up. He denies abdominal pain, bowel or bladder incontinence, saddle anesthesia, numbness or tingling. Legs. He has a history of IV drug use, but states that he has not had any injection for the last 5 months. He states he was working very hard with a counselor to continue to stay clean from IV drug use. He has tried to seen an orthopedic physician for his chronic left hip pain but has been unable to do so secondary to financial reasons. He was diagnosed by his primary care provider for arthritis of the left hip. He has had no recent change to his chronic left hip pain.  Past Medical History:  Diagnosis Date  . Hemorrhoids     There are no active problems to display for this patient.   History reviewed. No pertinent surgical history.     Home Medications    Prior to Admission medications   Medication Sig Start Date End Date Taking? Authorizing Provider  acetaminophen (TYLENOL) 500 MG tablet Take 2,000 mg by mouth every 6 (six) hours as needed for mild pain, moderate pain, fever or headache.    Yes Historical Provider, MD  Cyanocobalamin (VITAMIN B-12 PO) Take 1 tablet by mouth daily.   Yes Historical Provider, MD  LORazepam (ATIVAN) 1 MG tablet Take 1 mg by mouth 3 (three) times daily.   Yes Historical Provider, MD  meloxicam (MOBIC) 7.5 MG tablet Take 7.5 mg by mouth daily.   Yes Historical Provider, MD  Multiple Vitamin (MULTIVITAMIN WITH MINERALS) TABS tablet Take 1 tablet by mouth daily.   Yes Historical Provider, MD    Family History History reviewed. No pertinent family history.  Social History Social History  Substance Use Topics  . Smoking status: Current Every Day Smoker    Packs/day: 0.50  . Smokeless tobacco: Never Used  . Alcohol use No     Allergies   Penicillins   Review of Systems Review of Systems  Constitutional: Positive for chills and fever.  HENT: Negative for congestion.   Eyes: Negative for visual disturbance.  Respiratory: Negative for cough and shortness of breath.   Cardiovascular: Negative.   Gastrointestinal: Positive for nausea and vomiting. Negative for abdominal pain.  Genitourinary: Negative for dysuria.  Musculoskeletal: Positive for back pain. Negative for neck pain.  Skin: Negative for wound.  Neurological: Negative for headaches.     Physical Exam Updated Vital Signs BP 147/95 (BP Location: Left Arm)   Pulse 61   Temp 97.8 F (36.6 C) (Oral)  Resp 18   Wt 118.8 kg   SpO2 99%   BMI 42.29 kg/m   Physical Exam  Constitutional: He is oriented to person, place, and time. He appears well-developed and well-nourished.  Appears in pain but NAD  Neck:   Full ROM without pain  No midline tenderness No tenderness of paraspinal musculature  Cardiovascular: Normal rate, regular rhythm, normal heart sounds and intact distal pulses.  Exam reveals no gallop and no friction rub.   No murmur heard. Pulmonary/Chest: Effort normal and breath sounds normal. No respiratory distress. He has no wheezes. He has no rales.  Abdominal:  Soft. Bowel sounds are normal. He exhibits no distension. There is no tenderness.  Musculoskeletal:       Arms: Tenderness to palpation as depicted in image. No noted deformities or signs of inflammation. No overlying skin changes. Pain with straight leg raises bilaterally, but no radicular symptoms. 5/5 muscle strength of bilateral lower extremities. Difficulty ambulating, unable to fully straighten L spine into extension.   Neurological: He is alert and oriented to person, place, and time. He has normal reflexes.  Bilateral lower extremities neurovascularly intact.  Skin: Skin is warm and dry. No rash noted. No erythema.  Nursing note and vitals reviewed.    ED Treatments / Results  Labs (all labs ordered are listed, but only abnormal results are displayed) Labs Reviewed  BASIC METABOLIC PANEL - Abnormal; Notable for the following:       Result Value   Glucose, Bld 106 (*)    All other components within normal limits  CBC WITH DIFFERENTIAL/PLATELET    EKG  EKG Interpretation None       Radiology No results found.  Procedures Procedures (including critical care time)  Medications Ordered in ED Medications  methocarbamol (ROBAXIN) tablet 1,000 mg (1,000 mg Oral Given 09/17/16 1538)  oxyCODONE-acetaminophen (PERCOCET/ROXICET) 5-325 MG per tablet 1 tablet (1 tablet Oral Given 09/17/16 1538)     Initial Impression / Assessment and Plan / ED Course  I have reviewed the triage vital signs and the nursing notes.  Pertinent labs & imaging results that were available during my care of the patient were reviewed by me and considered in my medical decision making (see chart for details).  Clinical Course   Joshua Jacobs is a 40 y.o. male who presents to ED for acute onset of midline and right lower back pain x 2-3 days. On exam, patient is afebrile and hemodynamically stable. He does exhibit midline L-spine tenderness. He has a hx of IVDU and endorses associated night sweats and  subjective fever since back pain has started. He has no saddle anesthesia, incontinence, lower extremity weakness or numbness. However, patient is unable to stand straight with decreased range of motion secondary to pain. Given his history of IV drug use and associated subjective fever/sweats, will order MR lumbar spine. Labs reviewed and reassuring. Normal white count.  6:51 PM - Nurse informed me patient has left AMA. He told nursing staff something came up at home and if he did not leave immediately CPS would be called. He stated he will try to come back to ER and nurse encouraged him to come back as well.  Final Clinical Impressions(s) / ED Diagnoses   Final diagnoses:  Midline low back pain    New Prescriptions Discharge Medication List as of 09/17/2016  6:57 PM       Chase PicketJaime Pilcher Race Latour, PA-C 09/17/16 1902    Arby BarretteMarcy Pfeiffer, MD 09/28/16 1431

## 2016-09-17 NOTE — Progress Notes (Signed)
Entered in d/c instructions Please use the resources provided to you in emergency room by case manager to assist you're your choice of doctor for follow up  These [place] uninsured resources provide possible primary care providers, resources for discounted medications, housing, dental resources, affordable care act information, plus other resources for [place]   A referral for you has been sent to [facility] if you have not received a call in 3 days you may contact them Call Karen Andrianos at [phone] Tuesday-Friday [email or web address] 

## 2016-09-18 ENCOUNTER — Emergency Department (HOSPITAL_BASED_OUTPATIENT_CLINIC_OR_DEPARTMENT_OTHER)
Admission: EM | Admit: 2016-09-18 | Discharge: 2016-09-18 | Disposition: A | Discharge status: Home / Self Care | Payer: 59 | Attending: Dermatology | Admitting: Dermatology

## 2016-09-18 ENCOUNTER — Encounter (HOSPITAL_BASED_OUTPATIENT_CLINIC_OR_DEPARTMENT_OTHER): Payer: Self-pay | Admitting: Emergency Medicine

## 2016-09-18 DIAGNOSIS — F17200 Nicotine dependence, unspecified, uncomplicated: Secondary | ICD-10-CM | POA: Insufficient documentation

## 2016-09-18 DIAGNOSIS — Z5321 Procedure and treatment not carried out due to patient leaving prior to being seen by health care provider: Secondary | ICD-10-CM | POA: Insufficient documentation

## 2016-09-18 DIAGNOSIS — M545 Low back pain: Secondary | ICD-10-CM | POA: Insufficient documentation

## 2016-09-18 NOTE — ED Triage Notes (Signed)
Pt in c/o lower back pain onset 3-4 days ago without injury. Tried home meds with no relief. Denies chronic pain. Pt alert, interactive, in NAD.

## 2016-10-02 ENCOUNTER — Other Ambulatory Visit: Payer: Self-pay | Admitting: Oncology

## 2017-08-09 ENCOUNTER — Emergency Department (HOSPITAL_BASED_OUTPATIENT_CLINIC_OR_DEPARTMENT_OTHER)
Admission: EM | Admit: 2017-08-09 | Discharge: 2017-08-10 | Disposition: A | Discharge status: Home / Self Care | Payer: Self-pay | Source: Home / Self Care | Attending: Emergency Medicine | Admitting: Emergency Medicine

## 2017-08-09 ENCOUNTER — Encounter (HOSPITAL_BASED_OUTPATIENT_CLINIC_OR_DEPARTMENT_OTHER): Payer: Self-pay

## 2017-08-09 DIAGNOSIS — S301XXA Contusion of abdominal wall, initial encounter: Secondary | ICD-10-CM | POA: Insufficient documentation

## 2017-08-09 DIAGNOSIS — S3991XA Unspecified injury of abdomen, initial encounter: Secondary | ICD-10-CM | POA: Insufficient documentation

## 2017-08-09 DIAGNOSIS — R0602 Shortness of breath: Secondary | ICD-10-CM | POA: Insufficient documentation

## 2017-08-09 DIAGNOSIS — Y939 Activity, unspecified: Secondary | ICD-10-CM | POA: Insufficient documentation

## 2017-08-09 DIAGNOSIS — S2231XA Fracture of one rib, right side, initial encounter for closed fracture: Secondary | ICD-10-CM | POA: Insufficient documentation

## 2017-08-09 DIAGNOSIS — Y9289 Other specified places as the place of occurrence of the external cause: Secondary | ICD-10-CM | POA: Insufficient documentation

## 2017-08-09 DIAGNOSIS — R042 Hemoptysis: Secondary | ICD-10-CM

## 2017-08-09 DIAGNOSIS — Z79899 Other long term (current) drug therapy: Secondary | ICD-10-CM | POA: Insufficient documentation

## 2017-08-09 DIAGNOSIS — Y999 Unspecified external cause status: Secondary | ICD-10-CM | POA: Insufficient documentation

## 2017-08-09 DIAGNOSIS — T148XXA Other injury of unspecified body region, initial encounter: Secondary | ICD-10-CM

## 2017-08-09 DIAGNOSIS — F17210 Nicotine dependence, cigarettes, uncomplicated: Secondary | ICD-10-CM

## 2017-08-09 DIAGNOSIS — T07XXXA Unspecified multiple injuries, initial encounter: Secondary | ICD-10-CM

## 2017-08-09 HISTORY — DX: Anxiety disorder, unspecified: F41.9

## 2017-08-09 HISTORY — DX: Unilateral primary osteoarthritis, left hip: M16.12

## 2017-08-09 HISTORY — DX: Opioid abuse, uncomplicated: F11.10

## 2017-08-09 HISTORY — DX: Eccrine sweat disorder, unspecified: L74.9

## 2017-08-09 HISTORY — DX: Post-traumatic stress disorder, unspecified: F43.10
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
## 2017-08-10 ENCOUNTER — Emergency Department (HOSPITAL_BASED_OUTPATIENT_CLINIC_OR_DEPARTMENT_OTHER): Payer: Self-pay

## 2017-08-10 ENCOUNTER — Encounter (HOSPITAL_BASED_OUTPATIENT_CLINIC_OR_DEPARTMENT_OTHER): Payer: Self-pay | Admitting: Emergency Medicine

## 2017-08-10 LAB — CBC WITH DIFFERENTIAL/PLATELET
BASOS ABS: 0 10*3/uL (ref 0.0–0.1)
BASOS PCT: 0 %
EOS ABS: 0.2 10*3/uL (ref 0.0–0.7)
Eosinophils Relative: 1 %
HEMATOCRIT: 43 % (ref 39.0–52.0)
HEMOGLOBIN: 14.3 g/dL (ref 13.0–17.0)
Lymphocytes Relative: 30 %
Lymphs Abs: 3.7 10*3/uL (ref 0.7–4.0)
MCH: 26.1 pg (ref 26.0–34.0)
MCHC: 33.3 g/dL (ref 30.0–36.0)
MCV: 78.5 fL (ref 78.0–100.0)
Monocytes Absolute: 1.2 10*3/uL — ABNORMAL HIGH (ref 0.1–1.0)
Monocytes Relative: 10 %
NEUTROS ABS: 7 10*3/uL (ref 1.7–7.7)
NEUTROS PCT: 59 %
Platelets: 323 10*3/uL (ref 150–400)
RBC: 5.48 MIL/uL (ref 4.22–5.81)
RDW: 15.7 % — ABNORMAL HIGH (ref 11.5–15.5)
WBC: 12.1 10*3/uL — AB (ref 4.0–10.5)

## 2017-08-10 LAB — BASIC METABOLIC PANEL
ANION GAP: 11 (ref 5–15)
BUN: 15 mg/dL (ref 6–20)
CHLORIDE: 101 mmol/L (ref 101–111)
CO2: 24 mmol/L (ref 22–32)
Calcium: 9.6 mg/dL (ref 8.9–10.3)
Creatinine, Ser: 2 mg/dL — ABNORMAL HIGH (ref 0.61–1.24)
GFR calc non Af Amer: 41 mL/min — ABNORMAL LOW (ref >60)
GFR, EST AFRICAN AMERICAN: 47 mL/min — AB (ref >60)
Glucose, Bld: 88 mg/dL (ref 65–99)
Potassium: 3.2 mmol/L — ABNORMAL LOW (ref 3.5–5.1)
SODIUM: 136 mmol/L (ref 135–145)

## 2017-08-10 MED ORDER — IOPAMIDOL (ISOVUE-300) INJECTION 61%
100.0000 mL | Freq: Once | INTRAVENOUS | Status: AC | PRN
  Administered 2017-08-10: 100 mL via INTRAVENOUS
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
## 2017-08-12 ENCOUNTER — Encounter (HOSPITAL_BASED_OUTPATIENT_CLINIC_OR_DEPARTMENT_OTHER): Payer: Self-pay | Admitting: *Deleted

## 2017-08-12 ENCOUNTER — Other Ambulatory Visit: Payer: Self-pay

## 2017-08-12 ENCOUNTER — Emergency Department (HOSPITAL_BASED_OUTPATIENT_CLINIC_OR_DEPARTMENT_OTHER): Payer: Self-pay

## 2017-08-12 ENCOUNTER — Inpatient Hospital Stay (HOSPITAL_BASED_OUTPATIENT_CLINIC_OR_DEPARTMENT_OTHER)
Admission: EM | Admit: 2017-08-12 | Discharge: 2017-08-19 | DRG: 872 | Disposition: A | Discharge status: Home / Self Care | Payer: Self-pay | Attending: Internal Medicine | Admitting: Internal Medicine

## 2017-08-12 DIAGNOSIS — F17210 Nicotine dependence, cigarettes, uncomplicated: Secondary | ICD-10-CM | POA: Diagnosis present

## 2017-08-12 DIAGNOSIS — L0390 Cellulitis, unspecified: Secondary | ICD-10-CM

## 2017-08-12 DIAGNOSIS — G894 Chronic pain syndrome: Secondary | ICD-10-CM | POA: Diagnosis present

## 2017-08-12 DIAGNOSIS — Z23 Encounter for immunization: Secondary | ICD-10-CM

## 2017-08-12 DIAGNOSIS — F17200 Nicotine dependence, unspecified, uncomplicated: Secondary | ICD-10-CM | POA: Diagnosis present

## 2017-08-12 DIAGNOSIS — E876 Hypokalemia: Secondary | ICD-10-CM | POA: Diagnosis present

## 2017-08-12 DIAGNOSIS — S50851A Superficial foreign body of right forearm, initial encounter: Secondary | ICD-10-CM

## 2017-08-12 DIAGNOSIS — Y92410 Unspecified street and highway as the place of occurrence of the external cause: Secondary | ICD-10-CM

## 2017-08-12 DIAGNOSIS — B9561 Methicillin susceptible Staphylococcus aureus infection as the cause of diseases classified elsewhere: Secondary | ICD-10-CM

## 2017-08-12 DIAGNOSIS — Z880 Allergy status to penicillin: Secondary | ICD-10-CM

## 2017-08-12 DIAGNOSIS — F4310 Post-traumatic stress disorder, unspecified: Secondary | ICD-10-CM | POA: Diagnosis present

## 2017-08-12 DIAGNOSIS — Z66 Do not resuscitate: Secondary | ICD-10-CM | POA: Diagnosis present

## 2017-08-12 DIAGNOSIS — L02413 Cutaneous abscess of right upper limb: Secondary | ICD-10-CM | POA: Diagnosis present

## 2017-08-12 DIAGNOSIS — A419 Sepsis, unspecified organism: Secondary | ICD-10-CM | POA: Diagnosis present

## 2017-08-12 DIAGNOSIS — M1612 Unilateral primary osteoarthritis, left hip: Secondary | ICD-10-CM | POA: Diagnosis present

## 2017-08-12 DIAGNOSIS — N179 Acute kidney failure, unspecified: Secondary | ICD-10-CM | POA: Diagnosis present

## 2017-08-12 DIAGNOSIS — S2231XA Fracture of one rib, right side, initial encounter for closed fracture: Secondary | ICD-10-CM

## 2017-08-12 DIAGNOSIS — F1910 Other psychoactive substance abuse, uncomplicated: Secondary | ICD-10-CM | POA: Diagnosis present

## 2017-08-12 DIAGNOSIS — F419 Anxiety disorder, unspecified: Secondary | ICD-10-CM | POA: Diagnosis present

## 2017-08-12 DIAGNOSIS — E669 Obesity, unspecified: Secondary | ICD-10-CM | POA: Diagnosis present

## 2017-08-12 DIAGNOSIS — S301XXA Contusion of abdominal wall, initial encounter: Secondary | ICD-10-CM

## 2017-08-12 DIAGNOSIS — R7881 Bacteremia: Secondary | ICD-10-CM

## 2017-08-12 DIAGNOSIS — A4101 Sepsis due to Methicillin susceptible Staphylococcus aureus: Principal | ICD-10-CM | POA: Diagnosis present

## 2017-08-12 DIAGNOSIS — L03113 Cellulitis of right upper limb: Secondary | ICD-10-CM | POA: Diagnosis present

## 2017-08-12 DIAGNOSIS — Z6838 Body mass index (BMI) 38.0-38.9, adult: Secondary | ICD-10-CM

## 2017-08-12 LAB — I-STAT CG4 LACTIC ACID, ED: Lactic Acid, Venous: 0.85 mmol/L (ref 0.5–1.9)

## 2017-08-12 MED ORDER — AZTREONAM 1 G IJ SOLR
INTRAMUSCULAR | Status: AC
  Filled 2017-08-12: qty 2

## 2017-08-12 MED ORDER — DEXTROSE 5 % IV SOLN
2.0000 g | Freq: Once | INTRAVENOUS | Status: AC
  Administered 2017-08-13: 2 g via INTRAVENOUS
  Filled 2017-08-12: qty 2

## 2017-08-12 MED ORDER — ONDANSETRON 8 MG PO TBDP
8.0000 mg | ORAL_TABLET | Freq: Once | ORAL | Status: AC
  Administered 2017-08-12: 8 mg via ORAL
  Filled 2017-08-12: qty 1

## 2017-08-12 MED ORDER — SODIUM CHLORIDE 0.9 % IV BOLUS (SEPSIS)
500.0000 mL | Freq: Once | INTRAVENOUS | Status: AC
  Administered 2017-08-13: 500 mL via INTRAVENOUS

## 2017-08-12 MED ORDER — LEVOFLOXACIN IN D5W 750 MG/150ML IV SOLN
750.0000 mg | Freq: Once | INTRAVENOUS | Status: AC
  Administered 2017-08-13: 750 mg via INTRAVENOUS
  Filled 2017-08-12: qty 150

## 2017-08-12 MED ORDER — SODIUM CHLORIDE 0.9 % IV BOLUS (SEPSIS)
1000.0000 mL | Freq: Once | INTRAVENOUS | Status: AC
  Administered 2017-08-13: 1000 mL via INTRAVENOUS

## 2017-08-12 MED ORDER — IBUPROFEN 800 MG PO TABS
800.0000 mg | ORAL_TABLET | Freq: Once | ORAL | Status: AC
  Administered 2017-08-12: 800 mg via ORAL
  Filled 2017-08-12: qty 1

## 2017-08-12 MED ORDER — VANCOMYCIN HCL IN DEXTROSE 1-5 GM/200ML-% IV SOLN
1000.0000 mg | Freq: Once | INTRAVENOUS | Status: AC
  Administered 2017-08-13: 1000 mg via INTRAVENOUS
  Filled 2017-08-12: qty 200
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
## 2017-08-13 ENCOUNTER — Inpatient Hospital Stay (HOSPITAL_BASED_OUTPATIENT_CLINIC_OR_DEPARTMENT_OTHER): Payer: Self-pay

## 2017-08-13 ENCOUNTER — Emergency Department (HOSPITAL_BASED_OUTPATIENT_CLINIC_OR_DEPARTMENT_OTHER): Payer: Self-pay

## 2017-08-13 ENCOUNTER — Encounter (HOSPITAL_COMMUNITY): Payer: Self-pay | Admitting: Internal Medicine

## 2017-08-13 DIAGNOSIS — N179 Acute kidney failure, unspecified: Secondary | ICD-10-CM | POA: Diagnosis present

## 2017-08-13 DIAGNOSIS — F419 Anxiety disorder, unspecified: Secondary | ICD-10-CM | POA: Diagnosis present

## 2017-08-13 DIAGNOSIS — L0390 Cellulitis, unspecified: Secondary | ICD-10-CM

## 2017-08-13 DIAGNOSIS — A419 Sepsis, unspecified organism: Secondary | ICD-10-CM | POA: Diagnosis present

## 2017-08-13 DIAGNOSIS — G894 Chronic pain syndrome: Secondary | ICD-10-CM | POA: Diagnosis present

## 2017-08-13 DIAGNOSIS — F17200 Nicotine dependence, unspecified, uncomplicated: Secondary | ICD-10-CM | POA: Diagnosis present

## 2017-08-13 DIAGNOSIS — F1910 Other psychoactive substance abuse, uncomplicated: Secondary | ICD-10-CM | POA: Diagnosis present

## 2017-08-13 LAB — URINALYSIS, ROUTINE W REFLEX MICROSCOPIC
Bilirubin Urine: NEGATIVE
Glucose, UA: NEGATIVE mg/dL
Hgb urine dipstick: NEGATIVE
Ketones, ur: NEGATIVE mg/dL
LEUKOCYTES UA: NEGATIVE
NITRITE: NEGATIVE
PH: 6 (ref 5.0–8.0)
Protein, ur: NEGATIVE mg/dL
SPECIFIC GRAVITY, URINE: 1.01 (ref 1.005–1.030)

## 2017-08-13 LAB — COMPREHENSIVE METABOLIC PANEL
ALT: 15 U/L — ABNORMAL LOW (ref 17–63)
AST: 26 U/L (ref 15–41)
Albumin: 4.3 g/dL (ref 3.5–5.0)
Alkaline Phosphatase: 88 U/L (ref 38–126)
Anion gap: 9 (ref 5–15)
BILIRUBIN TOTAL: 1 mg/dL (ref 0.3–1.2)
BUN: 20 mg/dL (ref 6–20)
CO2: 25 mmol/L (ref 22–32)
Calcium: 9 mg/dL (ref 8.9–10.3)
Chloride: 97 mmol/L — ABNORMAL LOW (ref 101–111)
Creatinine, Ser: 1.25 mg/dL — ABNORMAL HIGH (ref 0.61–1.24)
Glucose, Bld: 103 mg/dL — ABNORMAL HIGH (ref 65–99)
POTASSIUM: 3.9 mmol/L (ref 3.5–5.1)
Sodium: 131 mmol/L — ABNORMAL LOW (ref 135–145)
TOTAL PROTEIN: 8.4 g/dL — AB (ref 6.5–8.1)

## 2017-08-13 LAB — RAPID URINE DRUG SCREEN, HOSP PERFORMED
AMPHETAMINES: POSITIVE — AB
BARBITURATES: NOT DETECTED
BENZODIAZEPINES: NOT DETECTED
COCAINE: POSITIVE — AB
OPIATES: POSITIVE — AB
Tetrahydrocannabinol: NOT DETECTED

## 2017-08-13 LAB — CBC WITH DIFFERENTIAL/PLATELET
BASOS ABS: 0 10*3/uL (ref 0.0–0.1)
Basophils Relative: 0 %
Eosinophils Absolute: 0.2 10*3/uL (ref 0.0–0.7)
Eosinophils Relative: 1 %
HCT: 39.5 % (ref 39.0–52.0)
Hemoglobin: 13 g/dL (ref 13.0–17.0)
LYMPHS ABS: 3.2 10*3/uL (ref 0.7–4.0)
Lymphocytes Relative: 19 %
MCH: 26.2 pg (ref 26.0–34.0)
MCHC: 32.9 g/dL (ref 30.0–36.0)
MCV: 79.6 fL (ref 78.0–100.0)
MONO ABS: 2 10*3/uL — AB (ref 0.1–1.0)
MONOS PCT: 12 %
NEUTROS PCT: 68 %
Neutro Abs: 11.2 10*3/uL — ABNORMAL HIGH (ref 1.7–7.7)
PLATELETS: 329 10*3/uL (ref 150–400)
RBC: 4.96 MIL/uL (ref 4.22–5.81)
RDW: 15.6 % — AB (ref 11.5–15.5)
WBC MORPHOLOGY: INCREASED
WBC: 16.6 10*3/uL — AB (ref 4.0–10.5)

## 2017-08-13 LAB — APTT: APTT: 35 s (ref 24–36)

## 2017-08-13 LAB — ACETAMINOPHEN LEVEL: Acetaminophen (Tylenol), Serum: <10 ug/mL — ABNORMAL LOW (ref 10–30)

## 2017-08-13 LAB — PROTIME-INR
INR: 1.2
PROTHROMBIN TIME: 15.1 s (ref 11.4–15.2)

## 2017-08-13 LAB — LACTIC ACID, PLASMA: Lactic Acid, Venous: 1 mmol/L (ref 0.5–1.9)

## 2017-08-13 LAB — SALICYLATE LEVEL

## 2017-08-13 LAB — PROCALCITONIN: PROCALCITONIN: 0.1 ng/mL

## 2017-08-13 MED ORDER — IOPAMIDOL (ISOVUE-300) INJECTION 61%
100.0000 mL | Freq: Once | INTRAVENOUS | Status: AC | PRN
  Administered 2017-08-13: 100 mL via INTRAVENOUS

## 2017-08-13 MED ORDER — DOCUSATE SODIUM 100 MG PO CAPS
100.0000 mg | ORAL_CAPSULE | Freq: Two times a day (BID) | ORAL | Status: DC
  Administered 2017-08-13 – 2017-08-19 (×11): 100 mg via ORAL
  Filled 2017-08-13 (×12): qty 1

## 2017-08-13 MED ORDER — OXYCODONE HCL 5 MG PO TABS
15.0000 mg | ORAL_TABLET | Freq: Four times a day (QID) | ORAL | Status: DC | PRN
  Administered 2017-08-13 – 2017-08-14 (×3): 15 mg via ORAL
  Filled 2017-08-13 (×3): qty 3

## 2017-08-13 MED ORDER — KETOROLAC TROMETHAMINE 30 MG/ML IJ SOLN
15.0000 mg | Freq: Once | INTRAMUSCULAR | Status: AC
  Administered 2017-08-13: 15 mg via INTRAVENOUS
  Filled 2017-08-13: qty 1

## 2017-08-13 MED ORDER — AZTREONAM 1 G IJ SOLR
INTRAMUSCULAR | Status: AC
  Filled 2017-08-13: qty 1

## 2017-08-13 MED ORDER — ACETAMINOPHEN 500 MG PO TABS
1000.0000 mg | ORAL_TABLET | Freq: Once | ORAL | Status: AC
  Administered 2017-08-13: 1000 mg via ORAL
  Filled 2017-08-13: qty 2

## 2017-08-13 MED ORDER — ACETAMINOPHEN 325 MG PO TABS: 650.0000 mg | ORAL_TABLET | Freq: Four times a day (QID) | ORAL | Status: DC | PRN

## 2017-08-13 MED ORDER — AZTREONAM IN DEXTROSE 1 GM/50ML IV SOLN
1.0000 g | Freq: Three times a day (TID) | INTRAVENOUS | Status: DC
  Administered 2017-08-13 – 2017-08-14 (×2): 1 g via INTRAVENOUS
  Filled 2017-08-13 (×3): qty 50

## 2017-08-13 MED ORDER — ONDANSETRON HCL 4 MG PO TABS: 4.0000 mg | ORAL_TABLET | Freq: Four times a day (QID) | ORAL | Status: DC | PRN

## 2017-08-13 MED ORDER — ENOXAPARIN SODIUM 40 MG/0.4ML ~~LOC~~ SOLN
40.0000 mg | SUBCUTANEOUS | Status: DC
  Administered 2017-08-13 – 2017-08-18 (×6): 40 mg via SUBCUTANEOUS
  Filled 2017-08-13 (×6): qty 0.4

## 2017-08-13 MED ORDER — METHOCARBAMOL 500 MG PO TABS
500.0000 mg | ORAL_TABLET | Freq: Four times a day (QID) | ORAL | Status: DC | PRN
Start: 2017-08-13 — End: 2017-08-14
  Administered 2017-08-13 – 2017-08-14 (×3): 500 mg via ORAL
  Filled 2017-08-13 (×3): qty 1

## 2017-08-13 MED ORDER — KETOROLAC TROMETHAMINE 30 MG/ML IJ SOLN
30.0000 mg | Freq: Once | INTRAMUSCULAR | Status: AC
  Administered 2017-08-13: 30 mg via INTRAVENOUS
  Filled 2017-08-13: qty 1

## 2017-08-13 MED ORDER — VANCOMYCIN HCL IN DEXTROSE 1-5 GM/200ML-% IV SOLN
1000.0000 mg | Freq: Two times a day (BID) | INTRAVENOUS | Status: DC
  Administered 2017-08-13 (×2): 1000 mg via INTRAVENOUS
  Filled 2017-08-13 (×2): qty 200

## 2017-08-13 MED ORDER — LACTATED RINGERS IV SOLN
INTRAVENOUS | Status: DC
  Administered 2017-08-13: 23:00:00 via INTRAVENOUS

## 2017-08-13 MED ORDER — LORAZEPAM 2 MG/ML IJ SOLN
1.0000 mg | Freq: Once | INTRAMUSCULAR | Status: AC
  Administered 2017-08-13: 1 mg via INTRAVENOUS
  Filled 2017-08-13: qty 1

## 2017-08-13 MED ORDER — ACETAMINOPHEN 650 MG RE SUPP: 650.0000 mg | Freq: Four times a day (QID) | RECTAL | Status: DC | PRN

## 2017-08-13 MED ORDER — LORAZEPAM 1 MG PO TABS
1.0000 mg | ORAL_TABLET | Freq: Three times a day (TID) | ORAL | Status: DC
  Administered 2017-08-13 – 2017-08-19 (×17): 1 mg via ORAL
  Filled 2017-08-13 (×17): qty 1

## 2017-08-13 MED ORDER — DEXTROSE 5 % IV SOLN
1.0000 g | Freq: Three times a day (TID) | INTRAVENOUS | Status: DC
  Administered 2017-08-13 (×2): 1 g via INTRAVENOUS
  Filled 2017-08-13 (×2): qty 1

## 2017-08-13 MED ORDER — LEVOFLOXACIN IN D5W 750 MG/150ML IV SOLN
750.0000 mg | INTRAVENOUS | Status: DC
  Administered 2017-08-13: 750 mg via INTRAVENOUS
  Filled 2017-08-13: qty 150

## 2017-08-13 MED ORDER — ONDANSETRON HCL 4 MG/2ML IJ SOLN: 4.0000 mg | Freq: Four times a day (QID) | INTRAMUSCULAR | Status: DC | PRN
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
## 2017-08-13 NOTE — ED Notes (Signed)
Pt offered food and drink. Only requested water.

## 2017-08-13 NOTE — ED Notes (Signed)
Pt asking for muscle relaxer. Pt states he doesn't know if he can sit here any longer.

## 2017-08-13 NOTE — ED Notes (Signed)
Pt requesting home dose of ativan. EDP made aware.
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
## 2017-08-13 NOTE — Progress Notes (Signed)
Pt refused to lay flat on CT table.  He stated that it hurt too bad and he needed me to get him something to help with the pain in order to lay flat.
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
## 2017-08-13 NOTE — ED Notes (Signed)
Pt unable to lie flat for ct of chest and abd due to pain

## 2017-08-13 NOTE — ED Notes (Signed)
ED Provider at bedside. 
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
## 2017-08-13 NOTE — ED Notes (Signed)
Patient transported to CT 

## 2017-08-14 ENCOUNTER — Inpatient Hospital Stay (HOSPITAL_COMMUNITY): Payer: Self-pay

## 2017-08-14 DIAGNOSIS — R7881 Bacteremia: Secondary | ICD-10-CM

## 2017-08-14 DIAGNOSIS — S2231XD Fracture of one rib, right side, subsequent encounter for fracture with routine healing: Secondary | ICD-10-CM

## 2017-08-14 DIAGNOSIS — F1910 Other psychoactive substance abuse, uncomplicated: Secondary | ICD-10-CM

## 2017-08-14 DIAGNOSIS — K5900 Constipation, unspecified: Secondary | ICD-10-CM

## 2017-08-14 DIAGNOSIS — S301XXD Contusion of abdominal wall, subsequent encounter: Secondary | ICD-10-CM

## 2017-08-14 DIAGNOSIS — F17210 Nicotine dependence, cigarettes, uncomplicated: Secondary | ICD-10-CM

## 2017-08-14 DIAGNOSIS — L03113 Cellulitis of right upper limb: Secondary | ICD-10-CM

## 2017-08-14 DIAGNOSIS — S50851A Superficial foreign body of right forearm, initial encounter: Secondary | ICD-10-CM

## 2017-08-14 DIAGNOSIS — F1110 Opioid abuse, uncomplicated: Secondary | ICD-10-CM

## 2017-08-14 DIAGNOSIS — R51 Headache: Secondary | ICD-10-CM

## 2017-08-14 DIAGNOSIS — S301XXA Contusion of abdominal wall, initial encounter: Secondary | ICD-10-CM

## 2017-08-14 DIAGNOSIS — S2231XA Fracture of one rib, right side, initial encounter for closed fracture: Secondary | ICD-10-CM

## 2017-08-14 DIAGNOSIS — S3011XA Contusion of abdominal wall, initial encounter: Secondary | ICD-10-CM

## 2017-08-14 LAB — BASIC METABOLIC PANEL
Anion gap: 10 (ref 5–15)
BUN: 8 mg/dL (ref 6–20)
CALCIUM: 8.8 mg/dL — AB (ref 8.9–10.3)
CHLORIDE: 106 mmol/L (ref 101–111)
CO2: 21 mmol/L — ABNORMAL LOW (ref 22–32)
CREATININE: 0.77 mg/dL (ref 0.61–1.24)
Glucose, Bld: 110 mg/dL — ABNORMAL HIGH (ref 65–99)
POTASSIUM: 3.4 mmol/L — AB (ref 3.5–5.1)
Sodium: 137 mmol/L (ref 135–145)

## 2017-08-14 LAB — CBC
HCT: 34.5 % — ABNORMAL LOW (ref 39.0–52.0)
Hemoglobin: 11.4 g/dL — ABNORMAL LOW (ref 13.0–17.0)
MCH: 26 pg (ref 26.0–34.0)
MCHC: 33 g/dL (ref 30.0–36.0)
MCV: 78.6 fL (ref 78.0–100.0)
PLATELETS: 251 10*3/uL (ref 150–400)
RBC: 4.39 MIL/uL (ref 4.22–5.81)
RDW: 15.2 % (ref 11.5–15.5)
WBC: 11.4 10*3/uL — AB (ref 4.0–10.5)

## 2017-08-14 LAB — BLOOD CULTURE ID PANEL (REFLEXED)
ACINETOBACTER BAUMANNII: NOT DETECTED
CANDIDA GLABRATA: NOT DETECTED
CANDIDA KRUSEI: NOT DETECTED
Candida albicans: NOT DETECTED
Candida parapsilosis: NOT DETECTED
Candida tropicalis: NOT DETECTED
ENTEROBACTERIACEAE SPECIES: NOT DETECTED
ENTEROCOCCUS SPECIES: NOT DETECTED
Enterobacter cloacae complex: NOT DETECTED
Escherichia coli: NOT DETECTED
Haemophilus influenzae: NOT DETECTED
Klebsiella oxytoca: NOT DETECTED
Klebsiella pneumoniae: NOT DETECTED
LISTERIA MONOCYTOGENES: NOT DETECTED
Methicillin resistance: NOT DETECTED
NEISSERIA MENINGITIDIS: NOT DETECTED
PSEUDOMONAS AERUGINOSA: NOT DETECTED
Proteus species: NOT DETECTED
SERRATIA MARCESCENS: NOT DETECTED
STAPHYLOCOCCUS AUREUS BCID: DETECTED — AB
STREPTOCOCCUS AGALACTIAE: NOT DETECTED
STREPTOCOCCUS PNEUMONIAE: NOT DETECTED
STREPTOCOCCUS SPECIES: NOT DETECTED
Staphylococcus species: DETECTED — AB
Streptococcus pyogenes: NOT DETECTED

## 2017-08-14 LAB — ECHOCARDIOGRAM COMPLETE
Height: 66 in
WEIGHTICAEL: 3911.84 [oz_av]

## 2017-08-14 LAB — HIV ANTIBODY (ROUTINE TESTING W REFLEX): HIV Screen 4th Generation wRfx: NONREACTIVE

## 2017-08-14 LAB — LACTIC ACID, PLASMA: LACTIC ACID, VENOUS: 0.8 mmol/L (ref 0.5–1.9)

## 2017-08-14 MED ORDER — TETANUS-DIPHTH-ACELL PERTUSSIS 5-2.5-18.5 LF-MCG/0.5 IM SUSP
0.5000 mL | Freq: Once | INTRAMUSCULAR | Status: AC
  Administered 2017-08-14: 0.5 mL via INTRAMUSCULAR
  Filled 2017-08-14: qty 0.5

## 2017-08-14 MED ORDER — IOPAMIDOL (ISOVUE-300) INJECTION 61%
INTRAVENOUS | Status: AC
  Filled 2017-08-14: qty 100

## 2017-08-14 MED ORDER — METHOCARBAMOL 500 MG PO TABS: 1000.0000 mg | ORAL_TABLET | Freq: Four times a day (QID) | ORAL | Status: DC | PRN

## 2017-08-14 MED ORDER — OXYCODONE HCL 5 MG PO TABS
15.0000 mg | ORAL_TABLET | ORAL | Status: DC | PRN
  Administered 2017-08-14 – 2017-08-16 (×11): 15 mg via ORAL
  Filled 2017-08-14 (×11): qty 3

## 2017-08-14 MED ORDER — ACETAMINOPHEN 325 MG PO TABS
650.0000 mg | ORAL_TABLET | Freq: Four times a day (QID) | ORAL | Status: DC
  Administered 2017-08-14 – 2017-08-19 (×19): 650 mg via ORAL
  Filled 2017-08-14 (×19): qty 2

## 2017-08-14 MED ORDER — SODIUM CHLORIDE 0.9 % IV SOLN
700.0000 mg | INTRAVENOUS | Status: DC
  Administered 2017-08-14 – 2017-08-19 (×6): 700 mg via INTRAVENOUS
  Filled 2017-08-14 (×6): qty 14

## 2017-08-14 MED ORDER — KETOROLAC TROMETHAMINE 30 MG/ML IJ SOLN
INTRAMUSCULAR | Status: AC
  Filled 2017-08-14: qty 1

## 2017-08-14 MED ORDER — KETOROLAC TROMETHAMINE 30 MG/ML IJ SOLN
30.0000 mg | Freq: Four times a day (QID) | INTRAMUSCULAR | Status: AC
  Administered 2017-08-14 – 2017-08-15 (×5): 30 mg via INTRAVENOUS
  Filled 2017-08-14 (×5): qty 1

## 2017-08-14 MED ORDER — NAPROXEN 250 MG PO TABS
500.0000 mg | ORAL_TABLET | Freq: Two times a day (BID) | ORAL | Status: DC
  Filled 2017-08-14: qty 2

## 2017-08-14 MED ORDER — POTASSIUM CHLORIDE CRYS ER 20 MEQ PO TBCR
40.0000 meq | EXTENDED_RELEASE_TABLET | Freq: Once | ORAL | Status: AC
  Administered 2017-08-14: 40 meq via ORAL
  Filled 2017-08-14: qty 2

## 2017-08-14 MED ORDER — KETOROLAC TROMETHAMINE 30 MG/ML IJ SOLN
30.0000 mg | Freq: Four times a day (QID) | INTRAMUSCULAR | Status: DC | PRN
  Administered 2017-08-14: 30 mg via INTRAVENOUS

## 2017-08-14 MED ORDER — METHOCARBAMOL 1000 MG/10ML IJ SOLN
1000.0000 mg | Freq: Four times a day (QID) | INTRAVENOUS | Status: DC | PRN
  Administered 2017-08-15: 1000 mg via INTRAVENOUS
  Filled 2017-08-14 (×3): qty 10

## 2017-08-14 MED ORDER — IOPAMIDOL (ISOVUE-300) INJECTION 61%
100.0000 mL | Freq: Once | INTRAVENOUS | Status: AC | PRN
  Administered 2017-08-14: 100 mL via INTRAVENOUS
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
## 2017-08-14 NOTE — Progress Notes (Signed)
PHARMACY - PHYSICIAN COMMUNICATION CRITICAL VALUE ALERT - BLOOD CULTURE IDENTIFICATION (BCID)  Results for orders placed or performed during the hospital encounter of 08/12/17  Blood Culture ID Panel (Reflexed) (Collected: 08/13/2017 12:20 AM)  Result Value Ref Range   Enterococcus species NOT DETECTED NOT DETECTED   Listeria monocytogenes NOT DETECTED NOT DETECTED   Staphylococcus species DETECTED (A) NOT DETECTED   Staphylococcus aureus DETECTED (A) NOT DETECTED   Methicillin resistance NOT DETECTED NOT DETECTED   Streptococcus species NOT DETECTED NOT DETECTED   Streptococcus agalactiae NOT DETECTED NOT DETECTED   Streptococcus pneumoniae NOT DETECTED NOT DETECTED   Streptococcus pyogenes NOT DETECTED NOT DETECTED   Acinetobacter baumannii NOT DETECTED NOT DETECTED   Enterobacteriaceae species NOT DETECTED NOT DETECTED   Enterobacter cloacae complex NOT DETECTED NOT DETECTED   Escherichia coli NOT DETECTED NOT DETECTED   Klebsiella oxytoca NOT DETECTED NOT DETECTED   Klebsiella pneumoniae NOT DETECTED NOT DETECTED   Proteus species NOT DETECTED NOT DETECTED   Serratia marcescens NOT DETECTED NOT DETECTED   Haemophilus influenzae NOT DETECTED NOT DETECTED   Neisseria meningitidis NOT DETECTED NOT DETECTED   Pseudomonas aeruginosa NOT DETECTED NOT DETECTED   Candida albicans NOT DETECTED NOT DETECTED   Candida glabrata NOT DETECTED NOT DETECTED   Candida krusei NOT DETECTED NOT DETECTED   Candida parapsilosis NOT DETECTED NOT DETECTED   Candida tropicalis NOT DETECTED NOT DETECTED    Name of physician (or Provider) Contacted: Dr. Ninetta Lights  Changes to prescribed antibiotics required: adjust vanco/levofloxacin/aztreonam to daptomycin  Dannielle Huh 08/14/2017  8:41 AM
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
## 2017-08-14 NOTE — Consult Note (Addendum)
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 AT 0720 08/14/17 BY L BENFIELD    Staphylococcus aureus DETECTED (A) NOT DETECTED Final    Comment: Methicillin (oxacillin) susceptible Staphylococcus aureus (MSSA). Preferred therapy is anti staphylococcal beta lactam antibiotic (Cefazolin or Nafcillin), unless clinically contraindicated. CRITICAL RESULT CALLED TO, READ BACK BY AND VERIFIED WITH: J LEGGE,PHARMD AT 0720 08/14/17 BY L BENFIELD    Methicillin resistance NOT DETECTED NOT DETECTED Final   Streptococcus species NOT DETECTED NOT DETECTED Final   Streptococcus agalactiae NOT DETECTED NOT DETECTED Final   Streptococcus pneumoniae NOT DETECTED NOT DETECTED Final   Streptococcus pyogenes NOT DETECTED  NOT DETECTED Final   Acinetobacter baumannii NOT DETECTED NOT DETECTED Final   Enterobacteriaceae species NOT DETECTED NOT DETECTED Final   Enterobacter cloacae complex NOT DETECTED NOT DETECTED Final   Escherichia coli NOT DETECTED NOT DETECTED Final   Klebsiella oxytoca NOT DETECTED NOT DETECTED Final   Klebsiella pneumoniae NOT DETECTED NOT DETECTED Final   Proteus species NOT DETECTED NOT DETECTED Final   Serratia marcescens NOT DETECTED NOT DETECTED Final   Haemophilus influenzae NOT DETECTED NOT DETECTED Final   Neisseria meningitidis NOT DETECTED NOT DETECTED Final   Pseudomonas aeruginosa NOT DETECTED NOT DETECTED Final   Candida albicans NOT DETECTED NOT DETECTED Final   Candida glabrata NOT DETECTED NOT DETECTED Final   Candida krusei NOT DETECTED NOT DETECTED Final   Candida parapsilosis NOT DETECTED NOT DETECTED Final   Candida tropicalis NOT DETECTED NOT DETECTED Final    Comment: Performed at [facility], [place]. [address], [place], [place] [address]         [facility] Staphylococcus aureus bacteremia   Staphylococcus aureus bacteremia (SAB) is associated with a high rate of complications and mortality.  Specific aspects of clinical management are critical to optimizing the outcome of patients with SAB.  Therefore, the [facility] Antimicrobial Management Team [facility]) has initiated an intervention aimed at improving the management of SAB at [facility].  To do so, Infectious Diseases physicians are providing an evidence-based consult for the management of all patients with SAB.     Yes No Comments  Perform follow-up blood cultures (even if the patient is afebrile) to ensure clearance of bacteremia '[x]'$  '[]'$    Remove vascular catheter and obtain follow-up blood cultures after the removal of the catheter '[]'$  '[]'$    Perform echocardiography to evaluate for endocarditis (transthoracic ECHO is 40-50% sensitive, TEE is > 90% sensitive) '[x]'$  '[]'$  Please  keep in mind, that neither test can definitively EXCLUDE endocarditis, and that should clinical suspicion remain high for endocarditis the patient should then still be treated  with an "endocarditis" duration of therapy = 6 weeks  Consult electrophysiologist to evaluate implanted cardiac device (pacemaker, ICD) '[]'$  '[]'$    Ensure source control '[x]'$  '[]'$  Have all abscesses been drained effectively? Have deep seeded infections (septic joints or osteomyelitis) had appropriate surgical debridement?  Investigate for "metastatic" sites of infection '[]'$  '[]'$  Does the patient have ANY symptom or physical exam finding that would suggest a deeper infection (back or neck pain that may be suggestive of vertebral osteomyelitis or epidural abscess, muscle pain that could be a symptom of pyomyositis)?  Keep in mind that for deep seeded infections MRI imaging with contrast is preferred rather than other often insensitive tests such as plain x-rays, especially early in a patient's presentation.  Change antibiotic therapy to __________________ '[x]'$  '[]'$  Beta-lactam antibiotics are preferred for MSSA due to higher cure rates.   If on Vancomycin, goal trough should be 15 - 20 mcg/mL  Estimated duration of IV antibiotic therapy:   '[]'$  '[]'$  Consult case management for probably prolonged outpatient IV antibiotic therapy       Bobby Rumpf, MD [facility] [phone] 08/14/2017, 4:30 PM

## 2017-08-14 NOTE — Progress Notes (Signed)
  Echocardiogram 2D Echocardiogram has been performed.   Leta Jungling M 08/14/2017, 10:36 AM

## 2017-08-15 DIAGNOSIS — L0291 Cutaneous abscess, unspecified: Secondary | ICD-10-CM

## 2017-08-15 DIAGNOSIS — Z87898 Personal history of other specified conditions: Secondary | ICD-10-CM

## 2017-08-15 DIAGNOSIS — F1190 Opioid use, unspecified, uncomplicated: Secondary | ICD-10-CM

## 2017-08-15 DIAGNOSIS — Z880 Allergy status to penicillin: Secondary | ICD-10-CM

## 2017-08-15 HISTORY — PX: TEE WITHOUT CARDIOVERSION: SHX5443

## 2017-08-15 HISTORY — DX: Personal history of other specified conditions: Z87.898

## 2017-08-15 LAB — CBC WITH DIFFERENTIAL/PLATELET
BASOS ABS: 0 10*3/uL (ref 0.0–0.1)
Basophils Relative: 0 %
EOS PCT: 2 %
Eosinophils Absolute: 0.2 10*3/uL (ref 0.0–0.7)
HEMATOCRIT: 32.2 % — AB (ref 39.0–52.0)
Hemoglobin: 10.8 g/dL — ABNORMAL LOW (ref 13.0–17.0)
LYMPHS ABS: 2.1 10*3/uL (ref 0.7–4.0)
Lymphocytes Relative: 27 %
MCH: 25.8 pg — AB (ref 26.0–34.0)
MCHC: 33.5 g/dL (ref 30.0–36.0)
MCV: 77 fL — AB (ref 78.0–100.0)
MONO ABS: 0.7 10*3/uL (ref 0.1–1.0)
Monocytes Relative: 10 %
NEUTROS ABS: 4.6 10*3/uL (ref 1.7–7.7)
Neutrophils Relative %: 61 %
Platelets: 255 10*3/uL (ref 150–400)
RBC: 4.18 MIL/uL — ABNORMAL LOW (ref 4.22–5.81)
RDW: 15.4 % (ref 11.5–15.5)
WBC: 7.5 10*3/uL (ref 4.0–10.5)

## 2017-08-15 LAB — BASIC METABOLIC PANEL
ANION GAP: 9 (ref 5–15)
BUN: 14 mg/dL (ref 6–20)
CALCIUM: 8.9 mg/dL (ref 8.9–10.3)
CO2: 24 mmol/L (ref 22–32)
Chloride: 108 mmol/L (ref 101–111)
Creatinine, Ser: 0.73 mg/dL (ref 0.61–1.24)
GFR calc Af Amer: >60 mL/min (ref >60)
Glucose, Bld: 102 mg/dL — ABNORMAL HIGH (ref 65–99)
POTASSIUM: 3.9 mmol/L (ref 3.5–5.1)
Sodium: 141 mmol/L (ref 135–145)

## 2017-08-15 LAB — URINE CULTURE: CULTURE: NO GROWTH

## 2017-08-15 LAB — CK: CK TOTAL: 50 U/L (ref 49–397)

## 2017-08-15 LAB — MAGNESIUM: Magnesium: 2 mg/dL (ref 1.7–2.4)

## 2017-08-15 MED ORDER — CHLORHEXIDINE GLUCONATE 0.12 % MT SOLN
15.0000 mL | Freq: Two times a day (BID) | OROMUCOSAL | Status: DC
  Administered 2017-08-15 – 2017-08-17 (×5): 15 mL via OROMUCOSAL
  Filled 2017-08-15 (×6): qty 15

## 2017-08-15 MED ORDER — ORAL CARE MOUTH RINSE
15.0000 mL | Freq: Two times a day (BID) | OROMUCOSAL | Status: DC
  Administered 2017-08-17 (×2): 15 mL via OROMUCOSAL

## 2017-08-15 MED ORDER — ZOLPIDEM TARTRATE 5 MG PO TABS
5.0000 mg | ORAL_TABLET | Freq: Every evening | ORAL | Status: DC | PRN
Start: 2017-08-15 — End: 2017-08-19
  Administered 2017-08-16 – 2017-08-19 (×3): 5 mg via ORAL
  Filled 2017-08-15 (×3): qty 1
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
## 2017-08-15 NOTE — Progress Notes (Signed)
Date:  August 15, 2017 Chart reviewed for concurrent status and case management needs.  Will continue to follow patient progress.  Discharge Planning: following for needs  Expected discharge date: [id number]  Rhonda Davis, BSN, RN3, CCM   [phone]  

## 2017-08-15 NOTE — Final Consult Note (Signed)
Consultant Final Sign-Off Note    Assessment/Final recommendations  Joshua Jacobs is a 41 y.o. male followed by me for right forearm cellulitis   Wound care (if applicable): warm compresses and dry gauze    Diet at discharge: per primary team   Activity at discharge: per primary team   Follow-up appointment:  2 weeks in our office for wound check   Pending results:  Unresulted Labs    Start     Ordered   08/20/17 0500  Creatinine, serum  (enoxaparin (LOVENOX)    CrCl >/= 30 ml/min)  Weekly,   R    Comments:  while on enoxaparin therapy    08/13/17 1938   08/15/17 0800  Culture, blood (Routine X 2) w Reflex to ID Panel  BLOOD CULTURE X 2,   R    Question:  Patient immune status  Answer:  Normal   08/14/17 1657   08/15/17 0500  CK  Weekly,   R     08/14/17 0859   08/15/17 0500  CBC with Differential/Platelet  Daily,   R     08/14/17 1402   08/15/17 0500  Basic metabolic panel  Daily,   R     08/14/17 1402   08/14/17 1656  Hepatitis panel, acute  Once,   R     08/14/17 1657       Medication recommendations: Per ID team   Other recommendations: No indication for I&D at this time as wound is actively draining and cellulitis is improving    Thank you for allowing [place] to participate in the care of your patient!  Please consult [place] again if you have further needs for your patient.  Jerre Simon 08/15/2017 8:50 AM    Subjective   Pt states his arm is feeling better and he is having less pain. No fever or chills overnight. Tolerating diet. No new complaints. Pt states he has had abscess in the past when he was using IV drugs and he would I&D them himself.   Objective  Vital signs in last 24 hours: Temp:  [97.6 F (36.4 C)-98.4 F (36.9 C)] 97.7 F (36.5 C) (10/01 0601) Pulse Rate:  [61-73] 61 (10/01 0601) Resp:  [18] 18 (10/01 0601) BP: (117-128)/(75-80) 117/80 (10/01 0601) SpO2:  [99 %-100 %] 99 % (10/01 0601)  PE: General: very pleasant white male, NAD, well  appearing Cardio: RRR, no murmur appreciated  Lungs: CTA b/l, no wheezes noted Extremities: right forearm with area of induration and small open wound, pt able to express scant purulent drainage with pressure, minimal TTP, very minimal surrounding erythema (see photo below)      Pertinent labs and Studies:  Recent Labs  08/13/17 0028 08/14/17 0030 08/15/17 0641  WBC 16.6* 11.4* 7.5  HGB 13.0 11.4* 10.8*  HCT 39.5 34.5* 32.2*   BMET  Recent Labs  08/14/17 0030 08/15/17 0641  NA 137 141  K 3.4* 3.9  CL 106 108  CO2 21* 24  GLUCOSE 110* 102*  BUN 8 14  CREATININE 0.77 0.73  CALCIUM 8.8* 8.9   No results for input(s): LABURIN in the last 72 hours. Results for orders placed or performed during the hospital encounter of 08/12/17  Blood Culture (routine x 2)     Status: None (Preliminary result)   Collection Time: 08/13/17 12:10 AM  Result Value Ref Range Status   Specimen Description BLOOD LEFT ARM  Final   Special Requests   Final    BOTTLES DRAWN  AEROBIC ONLY Blood Culture adequate volume   Culture  Setup Time   Final    GRAM POSITIVE COCCI IN CLUSTERS AEROBIC BOTTLE ONLY CRITICAL RESULT CALLED TO, READ BACK BY AND VERIFIED WITH: J LEGGE,PHARMD AT 0720 08/14/17 BY L BENFIELD Performed at [facility], [address], [place], [place] [address]    Culture GRAM POSITIVE COCCI  Final   Report Status PENDING  Incomplete  Blood Culture (routine x 2)     Status: None (Preliminary result)   Collection Time: 08/13/17 12:20 AM  Result Value Ref Range Status   Specimen Description BLOOD RIGHT ARM  Final   Special Requests   Final    BOTTLES DRAWN AEROBIC AND ANAEROBIC Blood Culture results may not be optimal due to an excessive volume of blood received in culture bottles   Culture  Setup Time   Final    IN BOTH AEROBIC AND ANAEROBIC BOTTLES GRAM POSITIVE COCCI IN CLUSTERS CRITICAL RESULT CALLED TO, READ BACK BY AND VERIFIED WITH: J LEGGE,PHARMD AT 0720 08/14/17 BY L  BENFIELD Performed at [facility], [address], [place], [place] [address]    Culture GRAM POSITIVE COCCI  Final   Report Status PENDING  Incomplete  Blood Culture ID Panel (Reflexed)     Status: Abnormal   Collection Time: 08/13/17 12:20 AM  Result Value Ref Range Status   Enterococcus species NOT DETECTED NOT DETECTED Final   Listeria monocytogenes NOT DETECTED NOT DETECTED Final   Staphylococcus species DETECTED (A) NOT DETECTED Final    Comment: CRITICAL RESULT CALLED TO, READ BACK BY AND VERIFIED WITH: J LEGGE,PHARMD AT 0720 08/14/17 BY L BENFIELD    Staphylococcus aureus DETECTED (A) NOT DETECTED Final    Comment: Methicillin (oxacillin) susceptible Staphylococcus aureus (MSSA). Preferred therapy is anti staphylococcal beta lactam antibiotic (Cefazolin or Nafcillin), unless clinically contraindicated. CRITICAL RESULT CALLED TO, READ BACK BY AND VERIFIED WITH: J LEGGE,PHARMD AT 0720 08/14/17 BY L BENFIELD    Methicillin resistance NOT DETECTED NOT DETECTED Final   Streptococcus species NOT DETECTED NOT DETECTED Final   Streptococcus agalactiae NOT DETECTED NOT DETECTED Final   Streptococcus pneumoniae NOT DETECTED NOT DETECTED Final   Streptococcus pyogenes NOT DETECTED NOT DETECTED Final   Acinetobacter baumannii NOT DETECTED NOT DETECTED Final   Enterobacteriaceae species NOT DETECTED NOT DETECTED Final   Enterobacter cloacae complex NOT DETECTED NOT DETECTED Final   Escherichia coli NOT DETECTED NOT DETECTED Final   Klebsiella oxytoca NOT DETECTED NOT DETECTED Final   Klebsiella pneumoniae NOT DETECTED NOT DETECTED Final   Proteus species NOT DETECTED NOT DETECTED Final   Serratia marcescens NOT DETECTED NOT DETECTED Final   Haemophilus influenzae NOT DETECTED NOT DETECTED Final   Neisseria meningitidis NOT DETECTED NOT DETECTED Final   Pseudomonas aeruginosa NOT DETECTED NOT DETECTED Final   Candida albicans NOT DETECTED NOT DETECTED Final   Candida glabrata NOT  DETECTED NOT DETECTED Final   Candida krusei NOT DETECTED NOT DETECTED Final   Candida parapsilosis NOT DETECTED NOT DETECTED Final   Candida tropicalis NOT DETECTED NOT DETECTED Final    Comment: Performed at [facility], [address] [place], [place] [address]  Culture, Urine     Status: None   Collection Time: 08/13/17  9:22 PM  Result Value Ref Range Status   Specimen Description URINE, RANDOM  Final   Special Requests NONE  Final   Culture   Final    NO GROWTH Performed at [facility], [address], [place], [place] [address]    Report Status 08/15/2017 FINAL  Final    Imaging: Ct Forearm Right W Contrast  Result Date: 08/14/2017 CLINICAL DATA:  Forearm pain and swelling. Clinical concern of infection. History of intravenous drug injections and attempted foreign body removal. EXAM: CT OF THE UPPER RIGHT EXTREMITY WITH CONTRAST TECHNIQUE: Multidetector CT imaging of the upper right extremity was performed according to the standard protocol following intravenous contrast administration. COMPARISON:  Radiographs 08/13/2017. CONTRAST:  ISOVUE-300 IOPAMIDOL (ISOVUE-300) INJECTION 61% FINDINGS: Bones/Joint/Cartilage No evidence of acute fracture or dislocation. There is mild fragmented spurring of the coronoid process. There is no bone destruction. There is no significant elbow or wrist joint effusion. Ligaments Suboptimally assessed by CT. Muscles and Tendons The forearm muscles and tendons appear normal. No intramuscular fluid collections are identified. Soft tissues There is skin thickening and subcutaneous edema diffusely in the proximal to mid forearm. This extends into the distal aspect of the upper arm as well. There is a focal subcutaneous fluid collection within the volar aspect of the proximal forearm, measuring 14 x 9 x 14 mm. This is best seen on axial image 78. No other focal fluid collections are demonstrated. There is no evidence of foreign body or soft  tissue emphysema. No superficial thrombophlebitis identified. IMPRESSION: 1. Subcutaneous edema in the proximal to mid forearm consistent with cellulitis. There is a small superficial fluid collection in the volar aspect of the proximal forearm which may reflect a small abscess. 2. No evidence of deep fluid collection, joint effusion or osteomyelitis. Electronically Signed   By: Carey Bullocks M.D.   On: 08/14/2017 13:23

## 2017-08-15 NOTE — Progress Notes (Signed)
[facility]   Reason for visit: Follow up on bacteremia  Interval History: repeat blood culture sent, WBC improved.  No associated n/v/d.  No rashes.  TTE without obvious vegetation.     Physical Exam: Constitutional:  Vitals:   08/14/17 2044 08/15/17 0601  BP: 128/76 117/80  Pulse: 70 61  Resp: 18 18  Temp: 98.4 F (36.9 C) 97.7 F (36.5 C)  SpO2: 100% 99%   patient appears in NAD Eyes: anicteric HENT: no thrush Respiratory: Normal respiratory effort; CTA B Cardiovascular: RRR GI: soft, nt, nd  Review of Systems: Constitutional: negative for fevers, chills and malaise Gastrointestinal: negative for diarrhea  Lab Results  Component Value Date   WBC 7.5 08/15/2017   HGB 10.8 (L) 08/15/2017   HCT 32.2 (L) 08/15/2017   MCV 77.0 (L) 08/15/2017   PLT 255 08/15/2017    Lab Results  Component Value Date   CREATININE 0.73 08/15/2017   BUN 14 08/15/2017   NA 141 08/15/2017   K 3.9 08/15/2017   CL 108 08/15/2017   CO2 24 08/15/2017    Lab Results  Component Value Date   ALT 15 (L) 08/13/2017   AST 26 08/13/2017   ALKPHOS 88 08/13/2017     Microbiology: Recent Results (from the past 240 hour(s))  Blood Culture (routine x 2)     Status: Abnormal (Preliminary result)   Collection Time: 08/13/17 12:10 AM  Result Value Ref Range Status   Specimen Description BLOOD LEFT ARM  Final   Special Requests   Final    BOTTLES DRAWN AEROBIC ONLY Blood Culture adequate volume   Culture  Setup Time   Final    GRAM POSITIVE COCCI IN CLUSTERS AEROBIC BOTTLE ONLY CRITICAL RESULT CALLED TO, READ BACK BY AND VERIFIED WITH: J LEGGE,PHARMD AT 0720 08/14/17 BY L BENFIELD Performed at [facility], [address], [place], [place] [address]    Culture STAPHYLOCOCCUS AUREUS (A)  Final   Report Status PENDING  Incomplete  Blood Culture (routine x 2)     Status: Abnormal (Preliminary result)   Collection Time: 08/13/17 12:20 AM  Result Value Ref  Range Status   Specimen Description BLOOD RIGHT ARM  Final   Special Requests   Final    BOTTLES DRAWN AEROBIC AND ANAEROBIC Blood Culture results may not be optimal due to an excessive volume of blood received in culture bottles   Culture  Setup Time   Final    IN BOTH AEROBIC AND ANAEROBIC BOTTLES GRAM POSITIVE COCCI IN CLUSTERS CRITICAL RESULT CALLED TO, READ BACK BY AND VERIFIED WITH: J LEGGE,PHARMD AT 0720 08/14/17 BY L BENFIELD    Culture (A)  Final    STAPHYLOCOCCUS AUREUS SUSCEPTIBILITIES TO FOLLOW Performed at [facility], [address] [place], [place] [address]    Report Status PENDING  Incomplete  Blood Culture ID Panel (Reflexed)     Status: Abnormal   Collection Time: 08/13/17 12:20 AM  Result Value Ref Range Status   Enterococcus species NOT DETECTED NOT DETECTED Final   Listeria monocytogenes NOT DETECTED NOT DETECTED Final   Staphylococcus species DETECTED (A) NOT DETECTED Final    Comment: CRITICAL RESULT CALLED TO, READ BACK BY AND VERIFIED WITH: J LEGGE,PHARMD AT 0720 08/14/17 BY L BENFIELD    Staphylococcus aureus DETECTED (A) NOT DETECTED Final    Comment: Methicillin (oxacillin) susceptible Staphylococcus aureus (MSSA). Preferred therapy is anti staphylococcal beta lactam antibiotic (Cefazolin or Nafcillin),  unless clinically contraindicated. CRITICAL RESULT CALLED TO, READ BACK BY AND VERIFIED WITH: J LEGGE,PHARMD AT 0720 08/14/17 BY L BENFIELD    Methicillin resistance NOT DETECTED NOT DETECTED Final   Streptococcus species NOT DETECTED NOT DETECTED Final   Streptococcus agalactiae NOT DETECTED NOT DETECTED Final   Streptococcus pneumoniae NOT DETECTED NOT DETECTED Final   Streptococcus pyogenes NOT DETECTED NOT DETECTED Final   Acinetobacter baumannii NOT DETECTED NOT DETECTED Final   Enterobacteriaceae species NOT DETECTED NOT DETECTED Final   Enterobacter cloacae complex NOT DETECTED NOT DETECTED Final   Escherichia coli NOT DETECTED NOT DETECTED  Final   Klebsiella oxytoca NOT DETECTED NOT DETECTED Final   Klebsiella pneumoniae NOT DETECTED NOT DETECTED Final   Proteus species NOT DETECTED NOT DETECTED Final   Serratia marcescens NOT DETECTED NOT DETECTED Final   Haemophilus influenzae NOT DETECTED NOT DETECTED Final   Neisseria meningitidis NOT DETECTED NOT DETECTED Final   Pseudomonas aeruginosa NOT DETECTED NOT DETECTED Final   Candida albicans NOT DETECTED NOT DETECTED Final   Candida glabrata NOT DETECTED NOT DETECTED Final   Candida krusei NOT DETECTED NOT DETECTED Final   Candida parapsilosis NOT DETECTED NOT DETECTED Final   Candida tropicalis NOT DETECTED NOT DETECTED Final    Comment: Performed at [facility], [address], [place], [place] [address]  Culture, Urine     Status: None   Collection Time: 08/13/17  9:22 PM  Result Value Ref Range Status   Specimen Description URINE, RANDOM  Final   Special Requests NONE  Final   Culture   Final    NO GROWTH Performed at [facility], [address], [place], [place] [address]    Report Status 08/15/2017 FINAL  Final    Impression/Plan:  1. MSSA bacteremia from cellulitis/abscess - on daptomycin due to penicillin allergy.   With bacteremia, need TEE If no vegetation, will use oral therapy, likely linezolid  2.  Allergy - anaphylaxis.  On daptomycin as above.   3.  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

## 2017-08-16 ENCOUNTER — Encounter (HOSPITAL_COMMUNITY): Payer: Self-pay

## 2017-08-16 LAB — BASIC METABOLIC PANEL
Anion gap: 8 (ref 5–15)
BUN: 15 mg/dL (ref 6–20)
CALCIUM: 8.9 mg/dL (ref 8.9–10.3)
CO2: 24 mmol/L (ref 22–32)
CREATININE: 0.84 mg/dL (ref 0.61–1.24)
Chloride: 109 mmol/L (ref 101–111)
Glucose, Bld: 99 mg/dL (ref 65–99)
Potassium: 3.9 mmol/L (ref 3.5–5.1)
SODIUM: 141 mmol/L (ref 135–145)

## 2017-08-16 LAB — CBC WITH DIFFERENTIAL/PLATELET
Basophils Absolute: 0 10*3/uL (ref 0.0–0.1)
Basophils Relative: 0 %
Eosinophils Absolute: 0.2 10*3/uL (ref 0.0–0.7)
Eosinophils Relative: 3 %
HCT: 33.2 % — ABNORMAL LOW (ref 39.0–52.0)
Hemoglobin: 10.8 g/dL — ABNORMAL LOW (ref 13.0–17.0)
Lymphocytes Relative: 36 %
Lymphs Abs: 2.8 10*3/uL (ref 0.7–4.0)
MCH: 25.8 pg — ABNORMAL LOW (ref 26.0–34.0)
MCHC: 32.5 g/dL (ref 30.0–36.0)
MCV: 79.2 fL (ref 78.0–100.0)
Monocytes Absolute: 0.6 10*3/uL (ref 0.1–1.0)
Monocytes Relative: 8 %
Neutro Abs: 4 10*3/uL (ref 1.7–7.7)
Neutrophils Relative %: 53 %
Platelets: 296 10*3/uL (ref 150–400)
RBC: 4.19 MIL/uL — ABNORMAL LOW (ref 4.22–5.81)
RDW: 15.5 % (ref 11.5–15.5)
WBC: 7.6 10*3/uL (ref 4.0–10.5)

## 2017-08-16 LAB — CULTURE, BLOOD (ROUTINE X 2): SPECIAL REQUESTS: ADEQUATE

## 2017-08-16 LAB — HEPATITIS PANEL, ACUTE
HCV Ab: >11 s/co ratio — ABNORMAL HIGH (ref 0.0–0.9)
Hep A IgM: NEGATIVE
Hep B C IgM: NEGATIVE
Hepatitis B Surface Ag: NEGATIVE

## 2017-08-16 MED ORDER — KETOROLAC TROMETHAMINE 30 MG/ML IJ SOLN
30.0000 mg | Freq: Four times a day (QID) | INTRAMUSCULAR | Status: AC
  Administered 2017-08-16 – 2017-08-17 (×5): 30 mg via INTRAVENOUS
  Filled 2017-08-16 (×5): qty 1

## 2017-08-16 MED ORDER — METHOCARBAMOL 500 MG PO TABS
1000.0000 mg | ORAL_TABLET | Freq: Four times a day (QID) | ORAL | Status: DC | PRN
  Administered 2017-08-16 – 2017-08-19 (×6): 1000 mg via ORAL
  Filled 2017-08-16 (×6): qty 2

## 2017-08-16 MED ORDER — OXYCODONE HCL 5 MG PO TABS
15.0000 mg | ORAL_TABLET | ORAL | Status: DC | PRN
  Administered 2017-08-16 – 2017-08-19 (×20): 15 mg via ORAL
  Filled 2017-08-16 (×20): qty 3
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
## 2017-08-17 LAB — BASIC METABOLIC PANEL
Anion gap: 11 (ref 5–15)
BUN: 12 mg/dL (ref 6–20)
CALCIUM: 9.2 mg/dL (ref 8.9–10.3)
CHLORIDE: 107 mmol/L (ref 101–111)
CO2: 22 mmol/L (ref 22–32)
CREATININE: 0.75 mg/dL (ref 0.61–1.24)
GFR calc Af Amer: >60 mL/min (ref >60)
GFR calc non Af Amer: >60 mL/min (ref >60)
Glucose, Bld: 94 mg/dL (ref 65–99)
Potassium: 4.1 mmol/L (ref 3.5–5.1)
Sodium: 140 mmol/L (ref 135–145)

## 2017-08-17 LAB — CBC WITH DIFFERENTIAL/PLATELET
BASOS PCT: 0 %
Basophils Absolute: 0 10*3/uL (ref 0.0–0.1)
EOS ABS: 0.2 10*3/uL (ref 0.0–0.7)
Eosinophils Relative: 3 %
HEMATOCRIT: 35 % — AB (ref 39.0–52.0)
HEMOGLOBIN: 11.4 g/dL — AB (ref 13.0–17.0)
LYMPHS ABS: 2.8 10*3/uL (ref 0.7–4.0)
Lymphocytes Relative: 37 %
MCH: 25.3 pg — AB (ref 26.0–34.0)
MCHC: 32.6 g/dL (ref 30.0–36.0)
MCV: 77.8 fL — ABNORMAL LOW (ref 78.0–100.0)
MONO ABS: 0.5 10*3/uL (ref 0.1–1.0)
MONOS PCT: 7 %
NEUTROS PCT: 53 %
Neutro Abs: 4.1 10*3/uL (ref 1.7–7.7)
Platelets: 317 10*3/uL (ref 150–400)
RBC: 4.5 MIL/uL (ref 4.22–5.81)
RDW: 15.2 % (ref 11.5–15.5)
WBC: 7.7 10*3/uL (ref 4.0–10.5)

## 2017-08-17 MED ORDER — KETOROLAC TROMETHAMINE 30 MG/ML IJ SOLN
30.0000 mg | Freq: Four times a day (QID) | INTRAMUSCULAR | Status: AC | PRN
  Filled 2017-08-17: qty 1

## 2017-08-17 MED ORDER — KETOROLAC TROMETHAMINE 30 MG/ML IJ SOLN
30.0000 mg | Freq: Four times a day (QID) | INTRAMUSCULAR | Status: AC | PRN
  Administered 2017-08-18: 30 mg via INTRAVENOUS
  Filled 2017-08-17: qty 1
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
## 2017-08-17 NOTE — Progress Notes (Signed)
[facility]   Reason for visit: Follow up on bacteremia  Interval History: repeat blood culture ngtd, WBC wnl.  No associated n/v/d.  No rashes.  TTE without obvious vegetation.  TEE scheduled for tomorrow   Physical Exam: Constitutional:  Vitals:   08/16/17 2016 08/17/17 0552  BP: 140/88 121/69  Pulse: 64 62  Resp: 18 18  Temp: 97.8 F (36.6 C) 97.8 F (36.6 C)  SpO2: 100% 100%   patient appears in NAD Respiratory: Normal respiratory effort; CTA B Cardiovascular: RRR GI: soft, nt, nd  Review of Systems: Constitutional: negative for fevers, chills and malaise Gastrointestinal: negative for diarrhea  Lab Results  Component Value Date   WBC 7.7 08/17/2017   HGB 11.4 (L) 08/17/2017   HCT 35.0 (L) 08/17/2017   MCV 77.8 (L) 08/17/2017   PLT 317 08/17/2017    Lab Results  Component Value Date   CREATININE 0.75 08/17/2017   BUN 12 08/17/2017   NA 140 08/17/2017   K 4.1 08/17/2017   CL 107 08/17/2017   CO2 22 08/17/2017    Lab Results  Component Value Date   ALT 15 (L) 08/13/2017   AST 26 08/13/2017   ALKPHOS 88 08/13/2017     Microbiology: Recent Results (from the past 240 hour(s))  Blood Culture (routine x 2)     Status: Abnormal   Collection Time: 08/13/17 12:10 AM  Result Value Ref Range Status   Specimen Description BLOOD LEFT ARM  Final   Special Requests   Final    BOTTLES DRAWN AEROBIC ONLY Blood Culture adequate volume   Culture  Setup Time   Final    GRAM POSITIVE COCCI IN CLUSTERS AEROBIC BOTTLE ONLY CRITICAL RESULT CALLED TO, READ BACK BY AND VERIFIED WITH: J LEGGE,PHARMD AT 0720 08/14/17 BY L BENFIELD    Culture (A)  Final    STAPHYLOCOCCUS AUREUS SUSCEPTIBILITIES PERFORMED ON PREVIOUS CULTURE WITHIN THE LAST 5 DAYS. Performed at [facility], [address], [place], [place] [address]    Report Status 08/16/2017 FINAL  Final  Blood Culture (routine x 2)     Status: Abnormal   Collection Time: 08/13/17  12:20 AM  Result Value Ref Range Status   Specimen Description BLOOD RIGHT ARM  Final   Special Requests   Final    BOTTLES DRAWN AEROBIC AND ANAEROBIC Blood Culture results may not be optimal due to an excessive volume of blood received in culture bottles   Culture  Setup Time   Final    IN BOTH AEROBIC AND ANAEROBIC BOTTLES GRAM POSITIVE COCCI IN CLUSTERS CRITICAL RESULT CALLED TO, READ BACK BY AND VERIFIED WITH: J LEGGE,PHARMD AT 0720 08/14/17 BY L BENFIELD Performed at [facility], [address] [place], [place] [address]    Culture STAPHYLOCOCCUS AUREUS (A)  Final   Report Status 08/16/2017 FINAL  Final   Organism ID, Bacteria STAPHYLOCOCCUS AUREUS  Final      Susceptibility   Staphylococcus aureus - MIC*    CIPROFLOXACIN <=0.5 SENSITIVE Sensitive     ERYTHROMYCIN <=0.25 SENSITIVE Sensitive     GENTAMICIN <=0.5 SENSITIVE Sensitive     OXACILLIN 0.5 SENSITIVE Sensitive     TETRACYCLINE <=1 SENSITIVE Sensitive     VANCOMYCIN <=0.5 SENSITIVE Sensitive     TRIMETH/SULFA <=10 SENSITIVE Sensitive     CLINDAMYCIN <=0.25 SENSITIVE Sensitive     RIFAMPIN <=0.5 SENSITIVE Sensitive     Inducible Clindamycin NEGATIVE Sensitive     *  STAPHYLOCOCCUS AUREUS  Blood Culture ID Panel (Reflexed)     Status: Abnormal   Collection Time: 08/13/17 12:20 AM  Result Value Ref Range Status   Enterococcus species NOT DETECTED NOT DETECTED Final   Listeria monocytogenes NOT DETECTED NOT DETECTED Final   Staphylococcus species DETECTED (A) NOT DETECTED Final    Comment: CRITICAL RESULT CALLED TO, READ BACK BY AND VERIFIED WITH: J LEGGE,PHARMD AT 0720 08/14/17 BY L BENFIELD    Staphylococcus aureus DETECTED (A) NOT DETECTED Final    Comment: Methicillin (oxacillin) susceptible Staphylococcus aureus (MSSA). Preferred therapy is anti staphylococcal beta lactam antibiotic (Cefazolin or Nafcillin), unless clinically contraindicated. CRITICAL RESULT CALLED TO, READ BACK BY AND VERIFIED WITH: J  LEGGE,PHARMD AT 0720 08/14/17 BY L BENFIELD    Methicillin resistance NOT DETECTED NOT DETECTED Final   Streptococcus species NOT DETECTED NOT DETECTED Final   Streptococcus agalactiae NOT DETECTED NOT DETECTED Final   Streptococcus pneumoniae NOT DETECTED NOT DETECTED Final   Streptococcus pyogenes NOT DETECTED NOT DETECTED Final   Acinetobacter baumannii NOT DETECTED NOT DETECTED Final   Enterobacteriaceae species NOT DETECTED NOT DETECTED Final   Enterobacter cloacae complex NOT DETECTED NOT DETECTED Final   Escherichia coli NOT DETECTED NOT DETECTED Final   Klebsiella oxytoca NOT DETECTED NOT DETECTED Final   Klebsiella pneumoniae NOT DETECTED NOT DETECTED Final   Proteus species NOT DETECTED NOT DETECTED Final   Serratia marcescens NOT DETECTED NOT DETECTED Final   Haemophilus influenzae NOT DETECTED NOT DETECTED Final   Neisseria meningitidis NOT DETECTED NOT DETECTED Final   Pseudomonas aeruginosa NOT DETECTED NOT DETECTED Final   Candida albicans NOT DETECTED NOT DETECTED Final   Candida glabrata NOT DETECTED NOT DETECTED Final   Candida krusei NOT DETECTED NOT DETECTED Final   Candida parapsilosis NOT DETECTED NOT DETECTED Final   Candida tropicalis NOT DETECTED NOT DETECTED Final    Comment: Performed at [facility], [address] [place], [place] [address]  Culture, Urine     Status: None   Collection Time: 08/13/17  9:22 PM  Result Value Ref Range Status   Specimen Description URINE, RANDOM  Final   Special Requests NONE  Final   Culture   Final    NO GROWTH Performed at [facility], [address] [place], [place] [address]    Report Status 08/15/2017 FINAL  Final  Culture, blood (Routine X 2) w Reflex to ID Panel     Status: None (Preliminary result)   Collection Time: 08/15/17  9:03 AM  Result Value Ref Range Status   Specimen Description BLOOD LEFT ARM  Final   Special Requests IN PEDIATRIC BOTTLE Blood Culture adequate volume  Final   Culture    Final    NO GROWTH 1 DAY Performed at [facility], [address] [place], [place] [address]    Report Status PENDING  Incomplete    Impression/Plan:  1. MSSA bacteremia from cellulitis/abscess - on daptomycin due to penicillin allergy.   TEE tomorrow.  If no vegetation, will use oral therapy, likely linezolid  2.  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     

## 2017-08-18 ENCOUNTER — Encounter (HOSPITAL_COMMUNITY): Payer: Self-pay | Admitting: *Deleted

## 2017-08-18 ENCOUNTER — Inpatient Hospital Stay (HOSPITAL_COMMUNITY): Payer: Self-pay | Admitting: Anesthesiology

## 2017-08-18 ENCOUNTER — Inpatient Hospital Stay (HOSPITAL_COMMUNITY): Payer: Self-pay

## 2017-08-18 ENCOUNTER — Encounter (HOSPITAL_COMMUNITY)
Admission: EM | Disposition: A | Discharge status: Home / Self Care | Payer: Self-pay | Source: Home / Self Care | Attending: Internal Medicine

## 2017-08-18 DIAGNOSIS — R7881 Bacteremia: Secondary | ICD-10-CM

## 2017-08-18 DIAGNOSIS — G894 Chronic pain syndrome: Secondary | ICD-10-CM

## 2017-08-18 DIAGNOSIS — F419 Anxiety disorder, unspecified: Secondary | ICD-10-CM

## 2017-08-18 DIAGNOSIS — F1910 Other psychoactive substance abuse, uncomplicated: Secondary | ICD-10-CM

## 2017-08-18 DIAGNOSIS — F17200 Nicotine dependence, unspecified, uncomplicated: Secondary | ICD-10-CM

## 2017-08-18 HISTORY — PX: TEE WITHOUT CARDIOVERSION: SHX5443

## 2017-08-18 LAB — CBC WITH DIFFERENTIAL/PLATELET
BASOS PCT: 0 %
Basophils Absolute: 0 10*3/uL (ref 0.0–0.1)
EOS ABS: 0.3 10*3/uL (ref 0.0–0.7)
Eosinophils Relative: 4 %
HCT: 35.1 % — ABNORMAL LOW (ref 39.0–52.0)
HEMOGLOBIN: 11.4 g/dL — AB (ref 13.0–17.0)
LYMPHS PCT: 45 %
Lymphs Abs: 3.7 10*3/uL (ref 0.7–4.0)
MCH: 26 pg (ref 26.0–34.0)
MCHC: 32.5 g/dL (ref 30.0–36.0)
MCV: 80 fL (ref 78.0–100.0)
MONO ABS: 0.6 10*3/uL (ref 0.1–1.0)
Monocytes Relative: 7 %
Neutro Abs: 3.6 10*3/uL (ref 1.7–7.7)
Neutrophils Relative %: 44 %
PLATELETS: 335 10*3/uL (ref 150–400)
RBC: 4.39 MIL/uL (ref 4.22–5.81)
RDW: 15.3 % (ref 11.5–15.5)
WBC: 8.3 10*3/uL (ref 4.0–10.5)

## 2017-08-18 LAB — BASIC METABOLIC PANEL
Anion gap: 7 (ref 5–15)
BUN: 17 mg/dL (ref 6–20)
CHLORIDE: 105 mmol/L (ref 101–111)
CO2: 25 mmol/L (ref 22–32)
CREATININE: 0.81 mg/dL (ref 0.61–1.24)
Calcium: 8.8 mg/dL — ABNORMAL LOW (ref 8.9–10.3)
Glucose, Bld: 93 mg/dL (ref 65–99)
Potassium: 3.9 mmol/L (ref 3.5–5.1)
SODIUM: 137 mmol/L (ref 135–145)

## 2017-08-18 SURGERY — ECHOCARDIOGRAM, TRANSESOPHAGEAL
Anesthesia: Monitor Anesthesia Care

## 2017-08-18 MED ORDER — ONDANSETRON HCL 4 MG/2ML IJ SOLN
INTRAMUSCULAR | Status: DC | PRN
  Administered 2017-08-18: 4 mg via INTRAVENOUS

## 2017-08-18 MED ORDER — KETOROLAC TROMETHAMINE 30 MG/ML IJ SOLN: 30.0000 mg | Freq: Four times a day (QID) | INTRAMUSCULAR | Status: AC | PRN

## 2017-08-18 MED ORDER — FENTANYL CITRATE (PF) 100 MCG/2ML IJ SOLN
INTRAMUSCULAR | Status: DC | PRN
  Administered 2017-08-18 (×2): 50 ug via INTRAVENOUS

## 2017-08-18 MED ORDER — GLYCOPYRROLATE 0.2 MG/ML IV SOSY
PREFILLED_SYRINGE | INTRAVENOUS | Status: DC | PRN
  Administered 2017-08-18: .2 mg via INTRAVENOUS

## 2017-08-18 MED ORDER — MIDAZOLAM HCL 5 MG/5ML IJ SOLN
INTRAMUSCULAR | Status: DC | PRN
  Administered 2017-08-18 (×2): 1 mg via INTRAVENOUS

## 2017-08-18 MED ORDER — NICOTINE 21 MG/24HR TD PT24
21.0000 mg | MEDICATED_PATCH | Freq: Every day | TRANSDERMAL | Status: DC
  Administered 2017-08-19 (×2): 21 mg via TRANSDERMAL
  Filled 2017-08-18 (×2): qty 1

## 2017-08-18 MED ORDER — SODIUM CHLORIDE 0.9 % IV SOLN
INTRAVENOUS | Status: DC | PRN
  Administered 2017-08-18: 10:00:00 via INTRAVENOUS

## 2017-08-18 MED ORDER — BUTAMBEN-TETRACAINE-BENZOCAINE 2-2-14 % EX AERO
INHALATION_SPRAY | CUTANEOUS | Status: DC | PRN
Start: 2017-08-18 — End: 2017-08-18
  Administered 2017-08-18: 2 via TOPICAL

## 2017-08-18 MED ORDER — KETOROLAC TROMETHAMINE 30 MG/ML IJ SOLN
30.0000 mg | Freq: Four times a day (QID) | INTRAMUSCULAR | Status: AC | PRN
  Administered 2017-08-18: 30 mg via INTRAVENOUS

## 2017-08-18 MED ORDER — SALINE SPRAY 0.65 % NA SOLN
1.0000 | NASAL | Status: DC | PRN
  Filled 2017-08-18: qty 44

## 2017-08-18 MED ORDER — PROPOFOL 10 MG/ML IV BOLUS
INTRAVENOUS | Status: DC | PRN
  Administered 2017-08-18 (×2): 50 mg via INTRAVENOUS
  Administered 2017-08-18: 100 mg via INTRAVENOUS
  Administered 2017-08-18: 50 mg via INTRAVENOUS

## 2017-08-18 MED ORDER — LIDOCAINE 2% (20 MG/ML) 5 ML SYRINGE
INTRAMUSCULAR | Status: DC | PRN
  Administered 2017-08-18: 50 mg via INTRAVENOUS

## 2017-08-18 NOTE — Progress Notes (Signed)
    [facility]   Reason for visit: Follow up on Staph bacteremia  Interval History: TEE done and is negative for vegetation.  Repeat blood culture remains negative.   Physical Exam: Constitutional:  Vitals:   08/18/17 1035 08/18/17 1045  BP: 106/61 (!) 137/92  Pulse: (!) 57 61  Resp: 11 16  Temp:    SpO2: 98% 98%   patient appears in NAD  Impression: improved bacteremia.  No endocarditis noted and patient clinically improved.  Source arm.  At this point, adequate treatment can be achieved by oral treatment.   Plan: 1. linezolid po 600 mg bid for 10 days -will need to assure coverage prior to d/c -we will arrange follow up and repeat surveillance blood cultures after completing treatment
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
## 2017-08-18 NOTE — Anesthesia Procedure Notes (Signed)
Procedure Name: MAC Date/Time: 08/18/2017 10:12 AM Performed by: Orlie Dakin Pre-anesthesia Checklist: Patient identified, Emergency Drugs available, Suction available, Patient being monitored and Timeout performed Patient Re-evaluated:Patient Re-evaluated prior to induction Oxygen Delivery Method: Nasal cannula Preoxygenation: Pre-oxygenation with 100% oxygen

## 2017-08-18 NOTE — Anesthesia Postprocedure Evaluation (Signed)
Anesthesia Post Note  Patient: Joshua Jacobs  Procedure(s) Performed: TRANSESOPHAGEAL ECHOCARDIOGRAM (TEE) (N/A )     Patient location during evaluation: PACU Anesthesia Type: MAC Level of consciousness: awake and alert Pain management: pain level controlled Vital Signs Assessment: post-procedure vital signs reviewed and stable Respiratory status: spontaneous breathing, nonlabored ventilation and respiratory function stable Cardiovascular status: stable and blood pressure returned to baseline Postop Assessment: no apparent nausea or vomiting Anesthetic complications: no    Last Vitals:  Vitals:   08/18/17 0941 08/18/17 1026  BP: (!) 149/106 120/61  Pulse: (!) 57 70  Resp: 14 17  Temp: 36.8 C   SpO2: 99% 99%    Last Pain:  Vitals:   08/18/17 0941  TempSrc: Oral  PainSc: [place]

## 2017-08-18 NOTE — Anesthesia Preprocedure Evaluation (Signed)
Anesthesia Evaluation  Patient identified by MRN, date of birth, ID band Patient awake    Reviewed: Allergy & Precautions, NPO status , Patient's Chart, lab work & pertinent test results  Airway Mallampati: II  TM Distance: >3 FB Neck ROM: Full    Dental no notable dental hx.    Pulmonary neg pulmonary ROS, Current Smoker,    Pulmonary exam normal breath sounds clear to auscultation       Cardiovascular negative cardio ROS Normal cardiovascular exam Rhythm:Regular Rate:Normal     Neuro/Psych Anxiety negative neurological ROS  negative psych ROS   GI/Hepatic negative GI ROS, Neg liver ROS,   Endo/Other  negative endocrine ROS  Renal/GU negative Renal ROS  negative genitourinary   Musculoskeletal negative musculoskeletal ROS (+)   Abdominal   Peds negative pediatric ROS (+)  Hematology negative hematology ROS (+)   Anesthesia Other Findings Bacteremia  Reproductive/Obstetrics negative OB ROS                             Anesthesia Physical Anesthesia Plan  ASA: II  Anesthesia Plan: MAC   Post-op Pain Management:    Induction: Intravenous  PONV Risk Score and Plan: Treatment may vary due to age or medical condition  Airway Management Planned: Nasal Cannula  Additional Equipment:   Intra-op Plan:   Post-operative Plan:   Informed Consent: I have reviewed the patients History and Physical, chart, labs and discussed the procedure including the risks, benefits and alternatives for the proposed anesthesia with the patient or authorized representative who has indicated his/her understanding and acceptance.   Dental advisory given  Plan Discussed with: CRNA  Anesthesia Plan Comments:         Anesthesia Quick Evaluation

## 2017-08-18 NOTE — Transfer of Care (Signed)
Immediate Anesthesia Transfer of Care Note  Patient: Joshua Jacobs  Procedure(s) Performed: TRANSESOPHAGEAL ECHOCARDIOGRAM (TEE) (N/A )  Patient Location: Endoscopy Unit  Anesthesia Type:MAC  Level of Consciousness: awake, oriented and patient cooperative  Airway & Oxygen Therapy: Patient Spontanous Breathing and Patient connected to nasal cannula oxygen  Post-op Assessment: Report given to RN and Post -op Vital signs reviewed and stable  Post vital signs: Reviewed and stable  Last Vitals:  Vitals:   08/18/17 0612 08/18/17 0941  BP: 107/64 (!) 149/106  Pulse: (!) 50 (!) 57  Resp:  14  Temp:  36.8 C  SpO2:  99%    Last Pain:  Vitals:   08/18/17 0941  TempSrc: Oral  PainSc: 6       Patients Stated Pain Goal: 1 (33/74/45 1460)  Complications: No apparent anesthesia complications

## 2017-08-18 NOTE — Progress Notes (Signed)
Pt requesting Nicotine patch - states he has been waiting for it for 2-3 days now. Smokes 1&1/2 ppd. On call notified of request

## 2017-08-18 NOTE — Interval H&P Note (Signed)
History and Physical Interval Note:  08/18/2017 9:38 AM  Joshua Jacobs  has presented today for surgery, with the diagnosis of BACTEREMIA  The various methods of treatment have been discussed with the patient and family. After consideration of risks, benefits and other options for treatment, the patient has consented to  Procedure(s): TRANSESOPHAGEAL ECHOCARDIOGRAM (TEE) (N/A) as a surgical intervention .  The patient's history has been reviewed, patient examined, no change in status, stable for surgery.  I have reviewed the patient's chart and labs.  Questions were answered to the patient's satisfaction.     Olga Millers
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
## 2017-08-18 NOTE — Progress Notes (Signed)
Echocardiogram Echocardiogram Transesophageal has been performed.  Pieter Partridge 08/18/2017, 10:43 AM

## 2017-08-18 NOTE — Addendum Note (Signed)
Addendum  created 08/18/17 1037 by Julian Reil, CRNA   Anesthesia Review and Sign - Signed

## 2017-08-18 NOTE — CV Procedure (Signed)
    Transesophageal Echocardiogram Note  Sherman Donaldson [id number] January 26, 1976  Procedure: Transesophageal Echocardiogram Indications: Bacteremia  Procedure Details Consent: Obtained Time Out: Verified patient identification, verified procedure, site/side was marked, verified correct patient position, special equipment/implants available, Radiology Safety Procedures followed,  medications/allergies/relevent history reviewed, required imaging and test results available.  Performed  Medications:  Pt sedated by anesthesia with versed 2 mg, fentanyl 100 micrograms and diprovan 240 mg IV total.  Normal LV function; no vegetations.   Complications: No apparent complications Patient did tolerate procedure well.  Olga Millers, MD

## 2017-08-18 NOTE — H&P (View-Only) (Signed)
[facility]   Reason for visit: Follow up on bacteremia  Interval History: repeat blood culture ngtd, WBC wnl.  No associated n/v/d.  No rashes.  TTE without obvious vegetation.  TEE scheduled for tomorrow   Physical Exam: Constitutional:  Vitals:   08/16/17 2016 08/17/17 0552  BP: 140/88 121/69  Pulse: 64 62  Resp: 18 18  Temp: 97.8 F (36.6 C) 97.8 F (36.6 C)  SpO2: 100% 100%   patient appears in NAD Respiratory: Normal respiratory effort; CTA B Cardiovascular: RRR GI: soft, nt, nd  Review of Systems: Constitutional: negative for fevers, chills and malaise Gastrointestinal: negative for diarrhea  Lab Results  Component Value Date   WBC 7.7 08/17/2017   HGB 11.4 (L) 08/17/2017   HCT 35.0 (L) 08/17/2017   MCV 77.8 (L) 08/17/2017   PLT 317 08/17/2017    Lab Results  Component Value Date   CREATININE 0.75 08/17/2017   BUN 12 08/17/2017   NA 140 08/17/2017   K 4.1 08/17/2017   CL 107 08/17/2017   CO2 22 08/17/2017    Lab Results  Component Value Date   ALT 15 (L) 08/13/2017   AST 26 08/13/2017   ALKPHOS 88 08/13/2017     Microbiology: Recent Results (from the past 240 hour(s))  Blood Culture (routine x 2)     Status: Abnormal   Collection Time: 08/13/17 12:10 AM  Result Value Ref Range Status   Specimen Description BLOOD LEFT ARM  Final   Special Requests   Final    BOTTLES DRAWN AEROBIC ONLY Blood Culture adequate volume   Culture  Setup Time   Final    GRAM POSITIVE COCCI IN CLUSTERS AEROBIC BOTTLE ONLY CRITICAL RESULT CALLED TO, READ BACK BY AND VERIFIED WITH: J LEGGE,PHARMD AT 0720 08/14/17 BY L BENFIELD    Culture (A)  Final    STAPHYLOCOCCUS AUREUS SUSCEPTIBILITIES PERFORMED ON PREVIOUS CULTURE WITHIN THE LAST 5 DAYS. Performed at [facility], [address], [place], [place] [address]    Report Status 08/16/2017 FINAL  Final  Blood Culture (routine x 2)     Status: Abnormal   Collection Time: 08/13/17  12:20 AM  Result Value Ref Range Status   Specimen Description BLOOD RIGHT ARM  Final   Special Requests   Final    BOTTLES DRAWN AEROBIC AND ANAEROBIC Blood Culture results may not be optimal due to an excessive volume of blood received in culture bottles   Culture  Setup Time   Final    IN BOTH AEROBIC AND ANAEROBIC BOTTLES GRAM POSITIVE COCCI IN CLUSTERS CRITICAL RESULT CALLED TO, READ BACK BY AND VERIFIED WITH: J LEGGE,PHARMD AT 0720 08/14/17 BY L BENFIELD Performed at [facility], [address] [place], [place] [address]    Culture STAPHYLOCOCCUS AUREUS (A)  Final   Report Status 08/16/2017 FINAL  Final   Organism ID, Bacteria STAPHYLOCOCCUS AUREUS  Final      Susceptibility   Staphylococcus aureus - MIC*    CIPROFLOXACIN <=0.5 SENSITIVE Sensitive     ERYTHROMYCIN <=0.25 SENSITIVE Sensitive     GENTAMICIN <=0.5 SENSITIVE Sensitive     OXACILLIN 0.5 SENSITIVE Sensitive     TETRACYCLINE <=1 SENSITIVE Sensitive     VANCOMYCIN <=0.5 SENSITIVE Sensitive     TRIMETH/SULFA <=10 SENSITIVE Sensitive     CLINDAMYCIN <=0.25 SENSITIVE Sensitive     RIFAMPIN <=0.5 SENSITIVE Sensitive     Inducible Clindamycin NEGATIVE Sensitive     *  STAPHYLOCOCCUS AUREUS  Blood Culture ID Panel (Reflexed)     Status: Abnormal   Collection Time: 08/13/17 12:20 AM  Result Value Ref Range Status   Enterococcus species NOT DETECTED NOT DETECTED Final   Listeria monocytogenes NOT DETECTED NOT DETECTED Final   Staphylococcus species DETECTED (A) NOT DETECTED Final    Comment: CRITICAL RESULT CALLED TO, READ BACK BY AND VERIFIED WITH: J LEGGE,PHARMD AT 0720 08/14/17 BY L BENFIELD    Staphylococcus aureus DETECTED (A) NOT DETECTED Final    Comment: Methicillin (oxacillin) susceptible Staphylococcus aureus (MSSA). Preferred therapy is anti staphylococcal beta lactam antibiotic (Cefazolin or Nafcillin), unless clinically contraindicated. CRITICAL RESULT CALLED TO, READ BACK BY AND VERIFIED WITH: J  LEGGE,PHARMD AT 0720 08/14/17 BY L BENFIELD    Methicillin resistance NOT DETECTED NOT DETECTED Final   Streptococcus species NOT DETECTED NOT DETECTED Final   Streptococcus agalactiae NOT DETECTED NOT DETECTED Final   Streptococcus pneumoniae NOT DETECTED NOT DETECTED Final   Streptococcus pyogenes NOT DETECTED NOT DETECTED Final   Acinetobacter baumannii NOT DETECTED NOT DETECTED Final   Enterobacteriaceae species NOT DETECTED NOT DETECTED Final   Enterobacter cloacae complex NOT DETECTED NOT DETECTED Final   Escherichia coli NOT DETECTED NOT DETECTED Final   Klebsiella oxytoca NOT DETECTED NOT DETECTED Final   Klebsiella pneumoniae NOT DETECTED NOT DETECTED Final   Proteus species NOT DETECTED NOT DETECTED Final   Serratia marcescens NOT DETECTED NOT DETECTED Final   Haemophilus influenzae NOT DETECTED NOT DETECTED Final   Neisseria meningitidis NOT DETECTED NOT DETECTED Final   Pseudomonas aeruginosa NOT DETECTED NOT DETECTED Final   Candida albicans NOT DETECTED NOT DETECTED Final   Candida glabrata NOT DETECTED NOT DETECTED Final   Candida krusei NOT DETECTED NOT DETECTED Final   Candida parapsilosis NOT DETECTED NOT DETECTED Final   Candida tropicalis NOT DETECTED NOT DETECTED Final    Comment: Performed at [facility], [address] [place], [place] [address]  Culture, Urine     Status: None   Collection Time: 08/13/17  9:22 PM  Result Value Ref Range Status   Specimen Description URINE, RANDOM  Final   Special Requests NONE  Final   Culture   Final    NO GROWTH Performed at [facility], [address], [place], [place] [address]    Report Status 08/15/2017 FINAL  Final  Culture, blood (Routine X 2) w Reflex to ID Panel     Status: None (Preliminary result)   Collection Time: 08/15/17  9:03 AM  Result Value Ref Range Status   Specimen Description BLOOD LEFT ARM  Final   Special Requests IN PEDIATRIC BOTTLE Blood Culture adequate volume  Final   Culture    Final    NO GROWTH 1 DAY Performed at [facility], [address] [place], [place] [address]    Report Status PENDING  Incomplete    Impression/Plan:  1. MSSA bacteremia from cellulitis/abscess - on daptomycin due to penicillin allergy.   TEE tomorrow.  If no vegetation, will use oral therapy, likely linezolid  2.  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

## 2017-08-19 ENCOUNTER — Encounter (HOSPITAL_COMMUNITY): Payer: Self-pay | Admitting: Cardiology

## 2017-08-19 DIAGNOSIS — L0390 Cellulitis, unspecified: Secondary | ICD-10-CM

## 2017-08-19 DIAGNOSIS — A419 Sepsis, unspecified organism: Secondary | ICD-10-CM

## 2017-08-19 LAB — BASIC METABOLIC PANEL
ANION GAP: 8 (ref 5–15)
BUN: 21 mg/dL — ABNORMAL HIGH (ref 6–20)
CALCIUM: 8.8 mg/dL — AB (ref 8.9–10.3)
CO2: 26 mmol/L (ref 22–32)
Chloride: 103 mmol/L (ref 101–111)
Creatinine, Ser: 0.97 mg/dL (ref 0.61–1.24)
GLUCOSE: 99 mg/dL (ref 65–99)
Potassium: 4.2 mmol/L (ref 3.5–5.1)
Sodium: 137 mmol/L (ref 135–145)

## 2017-08-19 LAB — CBC WITH DIFFERENTIAL/PLATELET
BASOS ABS: 0 10*3/uL (ref 0.0–0.1)
BASOS PCT: 0 %
EOS PCT: 3 %
Eosinophils Absolute: 0.3 10*3/uL (ref 0.0–0.7)
HCT: 34.4 % — ABNORMAL LOW (ref 39.0–52.0)
Hemoglobin: 10.9 g/dL — ABNORMAL LOW (ref 13.0–17.0)
Lymphocytes Relative: 37 %
Lymphs Abs: 3.4 10*3/uL (ref 0.7–4.0)
MCH: 25.6 pg — ABNORMAL LOW (ref 26.0–34.0)
MCHC: 31.7 g/dL (ref 30.0–36.0)
MCV: 80.9 fL (ref 78.0–100.0)
MONO ABS: 0.7 10*3/uL (ref 0.1–1.0)
Monocytes Relative: 8 %
Neutro Abs: 4.8 10*3/uL (ref 1.7–7.7)
Neutrophils Relative %: 52 %
PLATELETS: 340 10*3/uL (ref 150–400)
RBC: 4.25 MIL/uL (ref 4.22–5.81)
RDW: 15.3 % (ref 11.5–15.5)
WBC: 9.3 10*3/uL (ref 4.0–10.5)

## 2017-08-19 MED ORDER — LORAZEPAM 1 MG PO TABS: 1.0000 mg | ORAL_TABLET | Freq: Three times a day (TID) | ORAL | 0 refills | Status: DC

## 2017-08-19 MED ORDER — OXYCODONE HCL 15 MG PO TABS: 15.0000 mg | ORAL_TABLET | ORAL | 0 refills | Status: DC | PRN

## 2017-08-19 MED ORDER — LINEZOLID 600 MG PO TABS: 600.0000 mg | ORAL_TABLET | Freq: Two times a day (BID) | ORAL | Status: DC

## 2017-08-19 MED ORDER — LINEZOLID 600 MG PO TABS: 600.0000 mg | ORAL_TABLET | Freq: Two times a day (BID) | ORAL | 0 refills | Status: AC

## 2017-08-19 NOTE — Progress Notes (Signed)
Patient is discharged at 1345; discharge paperwork was done. RN instructed to patient with new prescriptions and follow appointments. RN also educated and explained to patient about how to take medications as doctor prescribed. Patient has no question.

## 2017-08-19 NOTE — Progress Notes (Signed)
Joshua Jacobs requested his nurse due to difficulty breathing. Pt had developed sinus congestion to the point he felt he could not breath from his nose and was getting anxious.  After several minutes of trying to calm him down & reassure him he was OK - he mentioned he rinsed his mouth with one of the chlorhexidine gluconate oral rinses he had not used previously as that was the only thing he had done differently. Pt has had his room window open this evening so that was closed. Ice pack provided for holding on his sinuses & saline nasal spray provided.

## 2017-08-19 NOTE — Discharge Summary (Addendum)
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   Collection Time: 08/13/17  9:22 PM  Result Value Ref Range Status   Specimen Description URINE, RANDOM  Final   Special Requests NONE  Final   Culture   Final    NO GROWTH Performed at [facility], [address], [place], [place] [address]    Report Status 08/15/2017 FINAL  Final  Culture, blood (Routine X 2) w Reflex to ID Panel     Status: None (Preliminary result)   Collection Time: 08/15/17  9:03 AM  Result Value Ref Range Status   Specimen Description BLOOD LEFT ARM  Final   Special Requests IN PEDIATRIC BOTTLE Blood Culture adequate volume  Final   Culture   Final    NO GROWTH 3 DAYS Performed at [facility], [address], [place], [place] [address]    Report Status PENDING  Incomplete     Labs: Basic Metabolic Panel:  Recent Labs Lab 08/15/17 9147 08/16/17 8295 08/17/17 6213  08/18/17 0510 08/19/17 0515  NA 141 141 140 137 137  K 3.9 3.9 4.1 3.9 4.2  CL 108 109 107 105 103  CO2 GLUCOSE 102* 99 94 93 99  BUN 21*  CREATININE 0.73 0.84 0.75 0.81 0.97  CALCIUM 8.9 8.9 9.2 8.8* 8.8*  MG 2.0  --   --   --   --    Liver Function Tests:  Recent Labs Lab 08/13/17 0028  AST 26  ALT 15*  ALKPHOS 88  BILITOT 1.0  PROT 8.4*  ALBUMIN 4.3   No results for input(s): LIPASE, AMYLASE in the last 168 hours. No results for input(s): AMMONIA in the last 168 hours. CBC:  Recent Labs Lab 08/15/17 0641 08/16/17 0620 08/17/17 0611 08/18/17 0510 08/19/17 0515  WBC 7.5 7.6 7.7 8.3 9.3  NEUTROABS 4.6 4.0 4.1 3.6 4.8  HGB 10.8* 10.8* 11.4* 11.4* 10.9*  HCT 32.2* 33.2* 35.0* 35.1* 34.4*  MCV 77.0* 79.2 77.8* 80.0 80.9  PLT 255 296 317 335 340   Cardiac Enzymes:  Recent Labs Lab 08/15/17 0641  CKTOTAL 50   BNP: BNP (last 3 results) No results for input(s): BNP in the last 8760 hours.  ProBNP (last 3 results) No results for input(s): PROBNP in the last 8760 hours.  CBG: No results for input(s): GLUCAP in the last 168 hours.     SignedAlbertine Grates MD, PhD  [facility] 08/19/2017, 12:13 PM

## 2017-08-19 NOTE — Progress Notes (Addendum)
August 19, 2017 Chart and discharge orders researched for Case Management needs. Match program given to patient with information concerning the wellness clinic  and patient discharged to appropriate level of care. Patient and family have no further questions. Marcelle Smiling, BSN, [place], [place] [phone].

## 2017-08-20 LAB — CULTURE, BLOOD (ROUTINE X 2)
CULTURE: NO GROWTH
SPECIAL REQUESTS: ADEQUATE

## 2017-08-26 ENCOUNTER — Encounter (HOSPITAL_COMMUNITY): Payer: Self-pay | Admitting: *Deleted

## 2017-08-26 ENCOUNTER — Emergency Department (HOSPITAL_COMMUNITY)
Admission: EM | Admit: 2017-08-26 | Discharge: 2017-08-26 | Disposition: A | Discharge status: Home / Self Care | Payer: Self-pay | Attending: Emergency Medicine | Admitting: Emergency Medicine

## 2017-08-26 DIAGNOSIS — Y939 Activity, unspecified: Secondary | ICD-10-CM | POA: Insufficient documentation

## 2017-08-26 DIAGNOSIS — Z79899 Other long term (current) drug therapy: Secondary | ICD-10-CM | POA: Insufficient documentation

## 2017-08-26 DIAGNOSIS — R52 Pain, unspecified: Secondary | ICD-10-CM

## 2017-08-26 DIAGNOSIS — Y929 Unspecified place or not applicable: Secondary | ICD-10-CM | POA: Insufficient documentation

## 2017-08-26 DIAGNOSIS — Y999 Unspecified external cause status: Secondary | ICD-10-CM | POA: Insufficient documentation

## 2017-08-26 DIAGNOSIS — F17210 Nicotine dependence, cigarettes, uncomplicated: Secondary | ICD-10-CM | POA: Insufficient documentation

## 2017-08-26 DIAGNOSIS — S2231XA Fracture of one rib, right side, initial encounter for closed fracture: Secondary | ICD-10-CM | POA: Insufficient documentation

## 2017-08-26 MED ORDER — OXYCODONE HCL 15 MG PO TABS: 15.0000 mg | ORAL_TABLET | Freq: Four times a day (QID) | ORAL | 0 refills | Status: AC | PRN

## 2017-08-26 MED ORDER — LORAZEPAM 1 MG PO TABS: 1.0000 mg | ORAL_TABLET | Freq: Every day | ORAL | 0 refills | Status: DC
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
## 2017-08-26 NOTE — Discharge Instructions (Signed)
Follow-up with Gena Fray as prescribed on Monday for reevaluation. Also follow-up with your pain management physician for reevaluation. These pain medication refills and Ativan refills should last you until your appointment on Monday if you take them as prescribed. Return to the ED if any concerning signs or symptoms develop.

## 2017-08-29 ENCOUNTER — Encounter: Payer: Self-pay | Admitting: Physician Assistant

## 2017-08-29 ENCOUNTER — Ambulatory Visit (INDEPENDENT_AMBULATORY_CARE_PROVIDER_SITE_OTHER): Payer: Self-pay | Admitting: Physician Assistant

## 2017-08-29 VITALS — BP 137/93 | HR 75 | Temp 98.1°F | Ht 66.0 in | Wt 241.0 lb

## 2017-08-29 DIAGNOSIS — M1612 Unilateral primary osteoarthritis, left hip: Secondary | ICD-10-CM

## 2017-08-29 DIAGNOSIS — D649 Anemia, unspecified: Secondary | ICD-10-CM

## 2017-08-29 DIAGNOSIS — F1120 Opioid dependence, uncomplicated: Secondary | ICD-10-CM

## 2017-08-29 DIAGNOSIS — Z09 Encounter for follow-up examination after completed treatment for conditions other than malignant neoplasm: Secondary | ICD-10-CM

## 2017-08-29 DIAGNOSIS — F1910 Other psychoactive substance abuse, uncomplicated: Secondary | ICD-10-CM

## 2017-08-29 DIAGNOSIS — Z7689 Persons encountering health services in other specified circumstances: Secondary | ICD-10-CM

## 2017-08-29 DIAGNOSIS — Z79899 Other long term (current) drug therapy: Secondary | ICD-10-CM | POA: Insufficient documentation

## 2017-08-29 DIAGNOSIS — D509 Iron deficiency anemia, unspecified: Secondary | ICD-10-CM | POA: Insufficient documentation

## 2017-08-29 DIAGNOSIS — Z8619 Personal history of other infectious and parasitic diseases: Secondary | ICD-10-CM | POA: Insufficient documentation

## 2017-08-29 DIAGNOSIS — G894 Chronic pain syndrome: Secondary | ICD-10-CM

## 2017-08-29 DIAGNOSIS — R768 Other specified abnormal immunological findings in serum: Secondary | ICD-10-CM | POA: Insufficient documentation

## 2017-08-29 DIAGNOSIS — S2231XA Fracture of one rib, right side, initial encounter for closed fracture: Secondary | ICD-10-CM

## 2017-08-29 DIAGNOSIS — F4312 Post-traumatic stress disorder, chronic: Secondary | ICD-10-CM

## 2017-08-29 MED ORDER — LORAZEPAM 1 MG PO TABS: 1.0000 mg | ORAL_TABLET | Freq: Every day | ORAL | 0 refills | Status: AC

## 2017-08-29 MED ORDER — NALOXONE HCL 4 MG/0.1ML NA LIQD: 1.0000 | Freq: Once | NASAL | 0 refills | Status: AC

## 2017-08-29 MED ORDER — DICLOFENAC SODIUM 75 MG PO TBEC: 75.0000 mg | DELAYED_RELEASE_TABLET | Freq: Two times a day (BID) | ORAL | 0 refills | Status: DC
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
## 2017-08-29 NOTE — Patient Instructions (Signed)
-   Diclofenac twice a day - Ice or heat 3 times daily, whichever feels better - Hug a pillow when coughing or sneezing

## 2017-09-07 ENCOUNTER — Inpatient Hospital Stay: Payer: Self-pay | Admitting: Internal Medicine

## 2018-01-05 MED FILL — LORazepam 1 MG TABS: 1 | 30 days supply | Qty: 90 | Fill #0

## 2018-02-07 MED FILL — LORazepam 1 MG TABS: 1 | 30 days supply | Qty: 90 | Fill #1

## 2018-03-02 MED FILL — LORazepam 1 MG TABS: 1 | 30 days supply | Qty: 90 | Fill #2

## 2018-03-13 ENCOUNTER — Emergency Department (HOSPITAL_COMMUNITY)
Admission: EM | Admit: 2018-03-13 | Discharge: 2018-03-13 | Discharge status: Against Medical Advice | Payer: Self-pay | Attending: Emergency Medicine | Admitting: Emergency Medicine

## 2018-03-13 DIAGNOSIS — Z5321 Procedure and treatment not carried out due to patient leaving prior to being seen by health care provider: Secondary | ICD-10-CM | POA: Insufficient documentation

## 2018-04-14 ENCOUNTER — Emergency Department (HOSPITAL_COMMUNITY): Payer: Self-pay

## 2018-04-14 ENCOUNTER — Other Ambulatory Visit: Payer: Self-pay

## 2018-04-14 ENCOUNTER — Emergency Department (HOSPITAL_COMMUNITY)
Admission: EM | Admit: 2018-04-14 | Discharge: 2018-04-15 | Disposition: A | Discharge status: Home / Self Care | Payer: Self-pay | Attending: Emergency Medicine | Admitting: Emergency Medicine

## 2018-04-14 ENCOUNTER — Encounter (HOSPITAL_COMMUNITY): Payer: Self-pay

## 2018-04-14 DIAGNOSIS — R079 Chest pain, unspecified: Secondary | ICD-10-CM | POA: Insufficient documentation

## 2018-04-14 DIAGNOSIS — M25511 Pain in right shoulder: Secondary | ICD-10-CM | POA: Insufficient documentation

## 2018-04-14 DIAGNOSIS — W19XXXA Unspecified fall, initial encounter: Secondary | ICD-10-CM

## 2018-04-14 DIAGNOSIS — M25552 Pain in left hip: Secondary | ICD-10-CM | POA: Insufficient documentation

## 2018-04-14 DIAGNOSIS — Z79899 Other long term (current) drug therapy: Secondary | ICD-10-CM | POA: Insufficient documentation

## 2018-04-14 DIAGNOSIS — Z87891 Personal history of nicotine dependence: Secondary | ICD-10-CM | POA: Insufficient documentation

## 2018-04-14 LAB — BASIC METABOLIC PANEL
ANION GAP: 9 (ref 5–15)
BUN: 14 mg/dL (ref 6–20)
CO2: 26 mmol/L (ref 22–32)
Calcium: 9.2 mg/dL (ref 8.9–10.3)
Chloride: 105 mmol/L (ref 101–111)
Creatinine, Ser: 0.93 mg/dL (ref 0.61–1.24)
GFR calc Af Amer: >60 mL/min (ref >60)
GLUCOSE: 109 mg/dL — AB (ref 65–99)
POTASSIUM: 3.7 mmol/L (ref 3.5–5.1)
Sodium: 140 mmol/L (ref 135–145)

## 2018-04-14 LAB — CBC
HEMATOCRIT: 36.9 % — AB (ref 39.0–52.0)
HEMOGLOBIN: 12.2 g/dL — AB (ref 13.0–17.0)
MCH: 26.6 pg (ref 26.0–34.0)
MCHC: 33.1 g/dL (ref 30.0–36.0)
MCV: 80.4 fL (ref 78.0–100.0)
Platelets: 272 10*3/uL (ref 150–400)
RBC: 4.59 MIL/uL (ref 4.22–5.81)
RDW: 14.8 % (ref 11.5–15.5)
WBC: 10 10*3/uL (ref 4.0–10.5)

## 2018-04-14 LAB — I-STAT TROPONIN, ED: Troponin i, poc: 0 ng/mL (ref 0.00–0.08)

## 2018-04-14 MED ORDER — MELOXICAM 7.5 MG PO TABS: 15.0000 mg | ORAL_TABLET | Freq: Every day | ORAL | 0 refills | Status: DC

## 2018-04-14 MED ORDER — HYDROXYZINE HCL 25 MG PO TABS: 25.0000 mg | ORAL_TABLET | Freq: Four times a day (QID) | ORAL | 0 refills | Status: DC

## 2018-04-14 MED ORDER — HYDROMORPHONE HCL 1 MG/ML IJ SOLN
1.0000 mg | Freq: Once | INTRAMUSCULAR | Status: AC
  Administered 2018-04-15: 1 mg via INTRAMUSCULAR
  Filled 2018-04-14: qty 1

## 2018-04-14 NOTE — ED Triage Notes (Addendum)
PT C/O MID-UPPER CHEST PAIN, RIGHT SHOULDER PAIN, AND BILATERAL HIP PAIN BUT FROM A FALL DOWN THE STAIRS 2 DAYS AGO. PT DENIES LOC, BUT DID HIT HIS HEAD. PT ALSO C/O N/V/D.

## 2018-04-14 NOTE — ED Notes (Signed)
Called Pt in lobby for vital recheck, no response in lobby x2. 

## 2018-04-14 NOTE — Discharge Instructions (Signed)
Please follow-up with the [facility].
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
## 2018-04-24 MED FILL — MELOXICAM 7.5 MG TABLET: 7.5 | 15 days supply | Qty: 30 | Fill #0

## 2018-04-24 MED FILL — hydrOXYzine HCL 25 MG TABS: 25 | 3 days supply | Qty: 12 | Fill #0

## 2018-05-15 ENCOUNTER — Encounter (HOSPITAL_COMMUNITY): Payer: Self-pay | Admitting: Nurse Practitioner

## 2018-05-15 ENCOUNTER — Inpatient Hospital Stay (HOSPITAL_COMMUNITY)
Admission: EM | Admit: 2018-05-15 | Discharge: 2018-05-17 | DRG: 917 | Discharge status: Against Medical Advice | Payer: Self-pay | Attending: Internal Medicine | Admitting: Internal Medicine

## 2018-05-15 ENCOUNTER — Observation Stay (HOSPITAL_COMMUNITY): Payer: Self-pay

## 2018-05-15 DIAGNOSIS — F1920 Other psychoactive substance dependence, uncomplicated: Secondary | ICD-10-CM | POA: Diagnosis present

## 2018-05-15 DIAGNOSIS — T401X1A Poisoning by heroin, accidental (unintentional), initial encounter: Secondary | ICD-10-CM | POA: Diagnosis present

## 2018-05-15 DIAGNOSIS — R402362 Coma scale, best motor response, obeys commands, at arrival to emergency department: Secondary | ICD-10-CM | POA: Diagnosis present

## 2018-05-15 DIAGNOSIS — L03114 Cellulitis of left upper limb: Secondary | ICD-10-CM | POA: Diagnosis present

## 2018-05-15 DIAGNOSIS — L03211 Cellulitis of face: Secondary | ICD-10-CM | POA: Diagnosis present

## 2018-05-15 DIAGNOSIS — F1910 Other psychoactive substance abuse, uncomplicated: Secondary | ICD-10-CM

## 2018-05-15 DIAGNOSIS — L03113 Cellulitis of right upper limb: Secondary | ICD-10-CM | POA: Diagnosis present

## 2018-05-15 DIAGNOSIS — T405X1A Poisoning by cocaine, accidental (unintentional), initial encounter: Principal | ICD-10-CM | POA: Diagnosis present

## 2018-05-15 DIAGNOSIS — R402242 Coma scale, best verbal response, confused conversation, at arrival to emergency department: Secondary | ICD-10-CM | POA: Diagnosis present

## 2018-05-15 DIAGNOSIS — F1990 Other psychoactive substance use, unspecified, uncomplicated: Secondary | ICD-10-CM | POA: Diagnosis present

## 2018-05-15 DIAGNOSIS — T50901A Poisoning by unspecified drugs, medicaments and biological substances, accidental (unintentional), initial encounter: Secondary | ICD-10-CM | POA: Diagnosis present

## 2018-05-15 DIAGNOSIS — L0390 Cellulitis, unspecified: Secondary | ICD-10-CM

## 2018-05-15 DIAGNOSIS — T50904A Poisoning by unspecified drugs, medicaments and biological substances, undetermined, initial encounter: Secondary | ICD-10-CM

## 2018-05-15 DIAGNOSIS — R402122 Coma scale, eyes open, to pain, at arrival to emergency department: Secondary | ICD-10-CM | POA: Diagnosis present

## 2018-05-15 DIAGNOSIS — Z781 Physical restraint status: Secondary | ICD-10-CM

## 2018-05-15 DIAGNOSIS — Z8619 Personal history of other infectious and parasitic diseases: Secondary | ICD-10-CM

## 2018-05-15 DIAGNOSIS — F4310 Post-traumatic stress disorder, unspecified: Secondary | ICD-10-CM | POA: Diagnosis present

## 2018-05-15 HISTORY — DX: Unspecified osteoarthritis, unspecified site: M19.90

## 2018-05-15 HISTORY — DX: Other chronic pain: G89.29

## 2018-05-15 HISTORY — DX: Pain in unspecified hip: M25.559

## 2018-05-15 HISTORY — DX: Other psychoactive substance abuse, uncomplicated: F19.10

## 2018-05-15 HISTORY — DX: Personal history of other specified conditions: Z87.898

## 2018-05-15 LAB — CBC WITH DIFFERENTIAL/PLATELET
Abs Immature Granulocytes: 0.2 10*3/uL — ABNORMAL HIGH (ref 0.0–0.1)
BASOS ABS: 0.1 10*3/uL (ref 0.0–0.1)
BASOS PCT: 1 %
EOS PCT: 1 %
Eosinophils Absolute: 0.2 10*3/uL (ref 0.0–0.7)
HCT: 36.7 % — ABNORMAL LOW (ref 39.0–52.0)
HEMOGLOBIN: 11.4 g/dL — AB (ref 13.0–17.0)
Immature Granulocytes: 1 %
Lymphocytes Relative: 17 %
Lymphs Abs: 2.2 10*3/uL (ref 0.7–4.0)
MCH: 25.1 pg — AB (ref 26.0–34.0)
MCHC: 31.1 g/dL (ref 30.0–36.0)
MCV: 80.8 fL (ref 78.0–100.0)
MONO ABS: 1.1 10*3/uL — AB (ref 0.1–1.0)
Monocytes Relative: 9 %
Neutro Abs: 9.2 10*3/uL — ABNORMAL HIGH (ref 1.7–7.7)
Neutrophils Relative %: 71 %
PLATELETS: 315 10*3/uL (ref 150–400)
RBC: 4.54 MIL/uL (ref 4.22–5.81)
RDW: 14.1 % (ref 11.5–15.5)
WBC: 12.9 10*3/uL — ABNORMAL HIGH (ref 4.0–10.5)

## 2018-05-15 LAB — RAPID URINE DRUG SCREEN, HOSP PERFORMED
AMPHETAMINES: NOT DETECTED
BENZODIAZEPINES: NOT DETECTED
Cocaine: POSITIVE — AB
Opiates: POSITIVE — AB
TETRAHYDROCANNABINOL: NOT DETECTED

## 2018-05-15 LAB — ETHANOL

## 2018-05-15 LAB — LACTIC ACID, PLASMA
LACTIC ACID, VENOUS: 1.2 mmol/L (ref 0.5–1.9)
Lactic Acid, Venous: 0.6 mmol/L (ref 0.5–1.9)

## 2018-05-15 LAB — COMPREHENSIVE METABOLIC PANEL
ALK PHOS: 100 U/L (ref 38–126)
ALT: 12 U/L (ref 0–44)
AST: 35 U/L (ref 15–41)
Albumin: 3.8 g/dL (ref 3.5–5.0)
Anion gap: 11 (ref 5–15)
BUN: 11 mg/dL (ref 6–20)
CALCIUM: 8.8 mg/dL — AB (ref 8.9–10.3)
CHLORIDE: 106 mmol/L (ref 98–111)
CO2: 23 mmol/L (ref 22–32)
Creatinine, Ser: 0.89 mg/dL (ref 0.61–1.24)
GFR calc Af Amer: >60 mL/min (ref >60)
GFR calc non Af Amer: >60 mL/min (ref >60)
Glucose, Bld: 118 mg/dL — ABNORMAL HIGH (ref 70–99)
Potassium: 4.2 mmol/L (ref 3.5–5.1)
Sodium: 140 mmol/L (ref 135–145)
Total Bilirubin: 1.8 mg/dL — ABNORMAL HIGH (ref 0.3–1.2)
Total Protein: 7.3 g/dL (ref 6.5–8.1)

## 2018-05-15 LAB — I-STAT CG4 LACTIC ACID, ED: Lactic Acid, Venous: 0.62 mmol/L (ref 0.5–1.9)

## 2018-05-15 LAB — MAGNESIUM: MAGNESIUM: 2.2 mg/dL (ref 1.7–2.4)

## 2018-05-15 LAB — MRSA PCR SCREENING: MRSA by PCR: NEGATIVE

## 2018-05-15 LAB — HIV ANTIBODY (ROUTINE TESTING W REFLEX): HIV SCREEN 4TH GENERATION: NONREACTIVE

## 2018-05-15 LAB — SALICYLATE LEVEL

## 2018-05-15 LAB — PHOSPHORUS: PHOSPHORUS: 2.6 mg/dL (ref 2.5–4.6)

## 2018-05-15 LAB — ACETAMINOPHEN LEVEL: Acetaminophen (Tylenol), Serum: <10 ug/mL — ABNORMAL LOW (ref 10–30)

## 2018-05-15 MED ORDER — STERILE WATER FOR INJECTION IJ SOLN
INTRAMUSCULAR | Status: AC
  Filled 2018-05-15: qty 10

## 2018-05-15 MED ORDER — SODIUM CHLORIDE 0.9 % IV SOLN
650.0000 mg | INTRAVENOUS | Status: DC
  Administered 2018-05-15: 650 mg via INTRAVENOUS
  Filled 2018-05-15 (×2): qty 13

## 2018-05-15 MED ORDER — SODIUM CHLORIDE 0.9 % IV SOLN
2000.0000 mg | Freq: Once | INTRAVENOUS | Status: AC
  Administered 2018-05-15: 2000 mg via INTRAVENOUS
  Filled 2018-05-15: qty 2000

## 2018-05-15 MED ORDER — ONDANSETRON HCL 4 MG/2ML IJ SOLN: 4.0000 mg | Freq: Four times a day (QID) | INTRAMUSCULAR | Status: DC | PRN

## 2018-05-15 MED ORDER — ACETAMINOPHEN 650 MG RE SUPP: 650.0000 mg | Freq: Four times a day (QID) | RECTAL | Status: DC | PRN

## 2018-05-15 MED ORDER — PANTOPRAZOLE SODIUM 40 MG PO TBEC
40.0000 mg | DELAYED_RELEASE_TABLET | Freq: Every day | ORAL | Status: DC
  Administered 2018-05-15 – 2018-05-17 (×3): 40 mg via ORAL
  Filled 2018-05-15 (×3): qty 1

## 2018-05-15 MED ORDER — KETOROLAC TROMETHAMINE 15 MG/ML IJ SOLN
15.0000 mg | Freq: Four times a day (QID) | INTRAMUSCULAR | Status: DC
  Administered 2018-05-15 – 2018-05-17 (×8): 15 mg via INTRAVENOUS
  Filled 2018-05-15 (×8): qty 1

## 2018-05-15 MED ORDER — ENOXAPARIN SODIUM 40 MG/0.4ML ~~LOC~~ SOLN: 40.0000 mg | SUBCUTANEOUS | Status: DC

## 2018-05-15 MED ORDER — SODIUM CHLORIDE 0.9 % IV SOLN
INTRAVENOUS | Status: DC
  Administered 2018-05-15 – 2018-05-17 (×5): via INTRAVENOUS

## 2018-05-15 MED ORDER — SODIUM CHLORIDE 0.9% FLUSH
3.0000 mL | Freq: Two times a day (BID) | INTRAVENOUS | Status: DC
  Administered 2018-05-16: 3 mL via INTRAVENOUS

## 2018-05-15 MED ORDER — LORAZEPAM 2 MG/ML IJ SOLN
2.0000 mg | Freq: Once | INTRAMUSCULAR | Status: AC
  Administered 2018-05-15: 2 mg via INTRAVENOUS
  Filled 2018-05-15: qty 1

## 2018-05-15 MED ORDER — ONDANSETRON HCL 4 MG PO TABS: 4.0000 mg | ORAL_TABLET | Freq: Four times a day (QID) | ORAL | Status: DC | PRN

## 2018-05-15 MED ORDER — ENOXAPARIN SODIUM 60 MG/0.6ML ~~LOC~~ SOLN
0.5000 mg/kg | SUBCUTANEOUS | Status: DC
  Administered 2018-05-15 – 2018-05-16 (×2): 55 mg via SUBCUTANEOUS
  Filled 2018-05-15 (×2): qty 0.6

## 2018-05-15 MED ORDER — ZIPRASIDONE MESYLATE 20 MG IM SOLR
20.0000 mg | Freq: Once | INTRAMUSCULAR | Status: AC
  Administered 2018-05-15: 20 mg via INTRAMUSCULAR
  Filled 2018-05-15: qty 20

## 2018-05-15 MED ORDER — ACETAMINOPHEN 325 MG PO TABS: 650.0000 mg | ORAL_TABLET | Freq: Four times a day (QID) | ORAL | Status: DC | PRN

## 2018-05-15 NOTE — ED Notes (Signed)
Pt resting comfortably

## 2018-05-15 NOTE — Progress Notes (Signed)
Pharmacy Antibiotic Note  Joshua Jacobs is a 42 y.o. male admitted on 05/15/2018 with drug overdose.  Pharmacy has been consulted for daptomycin dosing for cellulitis. WBC is mildly elevated and lactic acid is WNL. SCr is WNL. Pt treated with daptomycin in fall 2018 for cellulitis and MSSA bacteremia (PCN allergy).   Plan: Daptomycin 650mg  IV Q24H - starting when next dose of vancomycin would have been due F/u renal fxn, C&S, clinical status and weekly CK F/u measured weight     No data recorded.  Recent Labs  Lab 05/15/18 0216 05/15/18 0231 05/15/18 0731 05/15/18 0745  WBC 12.9*  --   --   --   CREATININE 0.89  --   --   --   LATICACIDVEN  --  1.2 0.6 0.62    CrCl cannot be calculated (Unknown ideal weight.).    Allergies not on file  Antimicrobials this admission: Dapto 7/1>> Vanc x 1 7/1  Dose adjustments this admission: N/A  Microbiology results: Pending  Thank you for allowing pharmacy to be a part of this patient's care.  Joshua Jacobs, Joshua Jacobs 05/15/2018 8:18 AM

## 2018-05-15 NOTE — ED Notes (Signed)
Placed Safety Mittens on Pt

## 2018-05-15 NOTE — Progress Notes (Signed)
ED brought pt's keys to unit. Nurse gave pt his keys. Nelda MarseilleJenny Thacker, RN

## 2018-05-15 NOTE — ED Notes (Signed)
PAGED ADMITTING PER RN  ORDERED DIET TRAY FOR PT  

## 2018-05-15 NOTE — ED Notes (Signed)
Pt aggressive/agitated, yelling (nonsense words), thrashing around in bed, Unable to give any information. Pt's friend informed RN and PA that Pt does use cocaine, heroin, "and stuff."  He is not sure what the pt took today but "I've never seem him like this."  He did give pt Narcan before coming to the ED.

## 2018-05-15 NOTE — ED Notes (Signed)
Pt continues to thrash around in the bed, not speaking nonsense.  Sitter bedside for safety.  Will continue to monitor

## 2018-05-15 NOTE — ED Notes (Signed)
Admitting provider paged to obtain diet order.

## 2018-05-15 NOTE — ED Notes (Signed)
Joshua Jacobs, Joshua Jacobs, brought phone for pt to waiting room. Asked how long stay would be, advised that will be determined upon waking with coherent talk; also advised pt would be asked to make contact when able.  Phone placed in room with pt belongings.

## 2018-05-15 NOTE — ED Notes (Signed)
Pt stated "hey im cold" while trying to apply blood pressure cuff. Verbally affirmed he felt better after having a sheet and two blankets put on top of him.
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
## 2018-05-15 NOTE — ED Notes (Signed)
IV placed in foot as verbal from Dr. Blinda LeatherwoodPollina. Placed in foot d/t difficult IV access r/t IV drug use

## 2018-05-15 NOTE — ED Notes (Signed)
Verbal order from Dr. Blinda LeatherwoodPollina for IV stick and lab sticks in pt's bilateral feet due to difficulty obtaining blood from arms.

## 2018-05-15 NOTE — ED Provider Notes (Signed)
[facility] Provider Note   CSN: [id number] Arrival date & time: 05/15/18  0153     History   Chief Complaint Chief Complaint  Patient presents with  . Drug Overdose    HPI Joshua Jacobs is a 42 y.o. male.  Patient brought to the ER by friend for evaluation of altered mental status.  Patient is a known polysubstance drug abuser.  He was with friends earlier tonight.  He reportedly went outside to talk to someone and then was noted to be incoherent and somnolent.  He was administered intranasal Narcan without effect.  Patient brought to the ER. Level V Caveat due to acuity.     No past medical history on file.  Patient Active Problem List   Diagnosis Date Noted  . Drug overdose 05/15/2018          Home Medications    Prior to Admission medications   Not on File    Family History No family history on file.  Social History Social History   Tobacco Use  . Smoking status: Not on file  Substance Use Topics  . Alcohol use: Not on file  . Drug use: Not on file     Allergies   Patient has no allergy information on record.   Review of Systems Review of Systems  Unable to perform ROS: Acuity of condition     Physical Exam Updated Vital Signs BP 125/81   Pulse 85   Resp (!) 48   SpO2 93%   Physical Exam  Constitutional: He appears lethargic. He appears distressed.  HENT:  Head: Atraumatic.  Eyes: Pupils are equal, round, and reactive to light. EOM are normal.  Neck: Neck supple.  Cardiovascular: Regular rhythm, S1 normal and S2 normal. Tachycardia present.  No murmur heard. Pulmonary/Chest: Effort normal and breath sounds normal.  Abdominal: Soft.  Musculoskeletal: Normal range of motion. He exhibits no deformity.  Neurological: He appears lethargic. No cranial nerve deficit or sensory deficit. GCS eye subscore is 2. GCS verbal subscore is 4. GCS motor subscore is 6.  Skin: He is diaphoretic.  Multiple  areas of soft tissue infection, erythema, induration, early abscess on bilateral upper extremities     ED Treatments / Results  Labs (all labs ordered are listed, but only abnormal results are displayed) Labs Reviewed  CBC WITH DIFFERENTIAL/PLATELET - Abnormal; Notable for the following components:      Result Value   WBC 12.9 (*)    Hemoglobin 11.4 (*)    HCT 36.7 (*)    MCH 25.1 (*)    Neutro Abs 9.2 (*)    Monocytes Absolute 1.1 (*)    Abs Immature Granulocytes 0.2 (*)    All other components within normal limits  COMPREHENSIVE METABOLIC PANEL - Abnormal; Notable for the following components:   Glucose, Bld 118 (*)    Calcium 8.8 (*)    Total Bilirubin 1.8 (*)    All other components within normal limits  ACETAMINOPHEN LEVEL - Abnormal; Notable for the following components:   Acetaminophen (Tylenol), Serum <10 (*)    All other components within normal limits  RAPID URINE DRUG SCREEN, HOSP PERFORMED - Abnormal; Notable for the following components:   Opiates POSITIVE (*)    Cocaine POSITIVE (*)    Barbiturates   (*)    Value: Result not available. Reagent lot number recalled by manufacturer.   All other components within normal limits  CULTURE, BLOOD (ROUTINE X 2)  CULTURE,  BLOOD (ROUTINE X 2)  SALICYLATE LEVEL  ETHANOL  LACTIC ACID, PLASMA  LACTIC ACID, PLASMA  I-STAT CG4 LACTIC ACID, ED    EKG None  Radiology No results found.  Procedures Procedures (including critical care time)  Medications Ordered in ED Medications  sterile water (preservative free) injection (has no administration in time range)  vancomycin (VANCOCIN) 2,000 mg in sodium chloride 0.9 % 500 mL IVPB (has no administration in time range)  0.9 %  sodium chloride infusion (has no administration in time range)  ziprasidone (GEODON) injection 20 mg (20 mg Intramuscular Given 05/15/18 0228)  LORazepam (ATIVAN) injection 2 mg (2 mg Intravenous Given 05/15/18 0306)     Initial Impression /  Assessment and Plan / ED Course  I have reviewed the triage vital signs and the nursing notes.  Pertinent labs & imaging results that were available during my care of the patient were reviewed by me and considered in my medical decision making (see chart for details).     Patient brought to the emergency department for evaluation of possible overdose.  Patient brought into the ER by friends.  They report that he went outside and then when they checked on him he was extremely altered.  At arrival to the ER, patient was agitated, thrashing and yelling/moaning.  He is moving all 4 extremities without difficulty.  No evidence of any obvious trauma.  Examination does reveal significant evidence of cellulitis and early abscess of his extremities consistent with IV drug injections.  Patient became progressively more agitated, and during work-up.  He was administered Geodon followed by Ativan to facilitate work-up.  Patient will require hospitalization for treatment of soft tissue infection.  CRITICAL CARE Performed by: Gilda Crease   Total critical care time: 30 minutes  Critical care time was exclusive of separately billable procedures and treating other patients.  Critical care was necessary to treat or prevent imminent or life-threatening deterioration.  Critical care was time spent personally by me on the following activities: development of treatment plan with patient and/or surrogate as well as nursing, discussions with consultants, evaluation of patient's response to treatment, examination of patient, obtaining history from patient or surrogate, ordering and performing treatments and interventions, ordering and review of laboratory studies, ordering and review of radiographic studies, pulse oximetry and re-evaluation of patient's condition.   Final Clinical Impressions(s) / ED Diagnoses   Final diagnoses:  Polysubstance abuse (HCC)  Drug overdose, undetermined intent, initial  encounter  Cellulitis, unspecified cellulitis site    ED Discharge Orders    None       Gilda Crease, MD 05/15/18 [phone]

## 2018-05-15 NOTE — ED Triage Notes (Signed)
Pt arrived POV, diaphoretic, intermittent slowed respirations, mumbling speech, jerking movements noted, multiple track marks noted. Pt mumbling stating he possibly shot up too soon.

## 2018-05-16 ENCOUNTER — Other Ambulatory Visit: Payer: Self-pay

## 2018-05-16 DIAGNOSIS — T50901A Poisoning by unspecified drugs, medicaments and biological substances, accidental (unintentional), initial encounter: Secondary | ICD-10-CM | POA: Diagnosis present

## 2018-05-16 LAB — COMPREHENSIVE METABOLIC PANEL
ALBUMIN: 3 g/dL — AB (ref 3.5–5.0)
ALT: 11 U/L (ref 0–44)
AST: 13 U/L — ABNORMAL LOW (ref 15–41)
Alkaline Phosphatase: 71 U/L (ref 38–126)
Anion gap: 5 (ref 5–15)
BILIRUBIN TOTAL: 0.6 mg/dL (ref 0.3–1.2)
BUN: 14 mg/dL (ref 6–20)
CO2: 22 mmol/L (ref 22–32)
Calcium: 8 mg/dL — ABNORMAL LOW (ref 8.9–10.3)
Chloride: 110 mmol/L (ref 98–111)
Creatinine, Ser: 0.75 mg/dL (ref 0.61–1.24)
GFR calc Af Amer: >60 mL/min (ref >60)
GFR calc non Af Amer: >60 mL/min (ref >60)
GLUCOSE: 97 mg/dL (ref 70–99)
POTASSIUM: 3.6 mmol/L (ref 3.5–5.1)
SODIUM: 137 mmol/L (ref 135–145)
TOTAL PROTEIN: 6.2 g/dL — AB (ref 6.5–8.1)

## 2018-05-16 LAB — CBC
HCT: 31.5 % — ABNORMAL LOW (ref 39.0–52.0)
Hemoglobin: 9.8 g/dL — ABNORMAL LOW (ref 13.0–17.0)
MCH: 25.5 pg — ABNORMAL LOW (ref 26.0–34.0)
MCHC: 31.1 g/dL (ref 30.0–36.0)
MCV: 82 fL (ref 78.0–100.0)
Platelets: 265 10*3/uL (ref 150–400)
RBC: 3.84 MIL/uL — ABNORMAL LOW (ref 4.22–5.81)
RDW: 14.4 % (ref 11.5–15.5)
WBC: 5.9 10*3/uL (ref 4.0–10.5)

## 2018-05-16 MED ORDER — DICYCLOMINE HCL 20 MG PO TABS: 20.0000 mg | ORAL_TABLET | Freq: Four times a day (QID) | ORAL | Status: DC | PRN

## 2018-05-16 MED ORDER — DAPTOMYCIN 500 MG IV SOLR
700.0000 mg | INTRAVENOUS | Status: DC
  Filled 2018-05-16: qty 14

## 2018-05-16 MED ORDER — LOPERAMIDE HCL 2 MG PO CAPS: 2.0000 mg | ORAL_CAPSULE | ORAL | Status: DC | PRN

## 2018-05-16 MED ORDER — NAPROXEN 250 MG PO TABS
500.0000 mg | ORAL_TABLET | Freq: Two times a day (BID) | ORAL | Status: DC | PRN
  Administered 2018-05-17: 500 mg via ORAL
  Filled 2018-05-16: qty 2

## 2018-05-16 MED ORDER — HYDROXYZINE HCL 25 MG PO TABS
25.0000 mg | ORAL_TABLET | Freq: Four times a day (QID) | ORAL | Status: DC | PRN
  Administered 2018-05-16 (×2): 25 mg via ORAL
  Filled 2018-05-16 (×2): qty 1

## 2018-05-16 MED ORDER — VANCOMYCIN HCL 10 G IV SOLR
1500.0000 mg | Freq: Two times a day (BID) | INTRAVENOUS | Status: DC
  Administered 2018-05-16 – 2018-05-17 (×2): 1500 mg via INTRAVENOUS
  Filled 2018-05-16 (×3): qty 1500

## 2018-05-16 MED ORDER — METHOCARBAMOL 500 MG PO TABS
500.0000 mg | ORAL_TABLET | Freq: Three times a day (TID) | ORAL | Status: DC | PRN
  Administered 2018-05-17: 500 mg via ORAL
  Filled 2018-05-16: qty 1

## 2018-05-16 MED ORDER — CLONIDINE HCL 0.1 MG PO TABS: 0.1000 mg | ORAL_TABLET | ORAL | Status: DC

## 2018-05-16 MED ORDER — CLONIDINE HCL 0.1 MG PO TABS
0.1000 mg | ORAL_TABLET | Freq: Four times a day (QID) | ORAL | Status: DC
  Administered 2018-05-16 – 2018-05-17 (×5): 0.1 mg via ORAL
  Filled 2018-05-16 (×5): qty 1

## 2018-05-16 MED ORDER — CLONIDINE HCL 0.1 MG PO TABS: 0.1000 mg | ORAL_TABLET | Freq: Every day | ORAL | Status: DC

## 2018-05-16 NOTE — Progress Notes (Signed)
Pharmacy Antibiotic Note  Joshua Jacobs is a 42 y.o. male admitted on 05/15/2018 with drug overdose.  Pharmacy has been consulted to transition to Vancomycin  Plan: Vancomycin 1500 mg iv Q 12 hours Follow up Scr, blood cultures.  Height: 5\' 6"  (167.6 cm) Weight: 257 lb 15 oz (117 kg)(bed weight/240lb per pt) IBW/kg (Calculated) : 63.8  Temp (24hrs), Avg:98 F (36.7 C), Min:97.5 F (36.4 C), Max:98.4 F (36.9 C)  Recent Labs  Lab 05/15/18 0216 05/15/18 0231 05/15/18 0731 05/15/18 0745 05/16/18 0404  WBC 12.9*  --   --   --  5.9  CREATININE 0.89  --   --   --  0.75  LATICACIDVEN  --  1.2 0.6 0.62  --     Estimated Creatinine Clearance: 149.2 mL/min (by C-G formula based on SCr of 0.75 mg/dL).    Allergies  Allergen Reactions  . Penicillins Anaphylaxis    Has patient had a PCN reaction causing immediate rash, facial/tongue/throat swelling, SOB or lightheadedness with hypotension: No Has patient had a PCN reaction causing severe rash involving mucus membranes or skin necrosis: No Has patient had a PCN reaction that required hospitalization: Yes Has patient had a PCN reaction occurring within the last 10 years: No If all of the above answers are "NO", then may proceed with Cephalosporin use.    Antimicrobials this admission: Dapto 7/1>>7/2 Vanc x 1 7/1 then 7/2>  Dose adjustments this admission: N/A  Microbiology results: Pending  Thank you for allowing pharmacy to be a part of this patient's care.  Joshua Jacobs, Joshua Jacobs 05/16/2018 3:22 PM

## 2018-05-16 NOTE — Progress Notes (Deleted)
[facility] to transport patient to facility.

## 2018-05-16 NOTE — Progress Notes (Signed)
Pt  Called RN to room.  He had opened abscessed area to right inner upper arm by squeezing with fingers. Blood and purulent drainage noted on chest, hands and right arm.  Marked area unchanged. Pt cleaned up. MD made aware

## 2018-05-16 NOTE — Progress Notes (Addendum)
PROGRESS NOTE    Joshua Jacobs  UJW:[id number] DOB: 1976/10/09 DOA: 05/15/2018 PCP: System, Pcp Not In     Brief Narrative:  Joshua Jacobs is a 42 y.o. 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  Patient was brought to the ED by friends because of abnormal behaviors and concerns of a possible overdose.  Friend was able to give limited history stating he was not sure with the patient took but also clarified by stating "I have never seen him like this".  Friend did attempt to give the patient Narcan before he was brought to the ER.  Patient presented to triage he was diaphoretic with intermittent slow respirations.  He demonstrated mumbling speech jerking movements.  Triage nurse noted multiple track marks.  Triage nurse also documented that the patient was mumbling that he possibly "shot up too soon".  Patient became aggressive and agitated and was yelling nonsense wounds and was thrashing about in the bed.  Because of his agitation EDP opted to give the patient a dose of Geodon as well as a dose of Ativan.  He was unable to give any information regarding preadmission history or drug use.  In the ER patient was afebrile, normotensive, he was not hypoxemic.  He has remained sedated.  He was noted to have a leukocytosis of 12,900 with a left shift.  He was also found to have an indurated cellulitic area in the medial aspect of the right forearm and given his history of blood cultures were obtained and he was given an empiric dose of IV vancomycin.  Positive for opiates and cocaine.   New events last 24 hours / Subjective: Patient is awake and alert this morning, does not recall why he was brought to the hospital. He complains of pain in his hips,  RUE.   Assessment & Plan:   Principal Problem:   Drug overdose Active Problems:   Cellulitis   Polysubstance dependence (HCC)   IV drug user   Drug overdose, accidental or unintentional, initial encounter   Drug overdose / drug abuse -COWS for withdrawal -Currently awake and alert   RUE cellulitis -In setting of IVDA -Blood cultures pending -Continue vanco    DVT prophylaxis: Lovenox Code Status: Full Family Communication: No family at bedside Disposition Plan: Pending improvement, blood culture results   Consultants:   none  Procedures:   none  Antimicrobials:  Anti-infectives (From admission, onward)   Start     Dose/Rate Route Frequency Ordered Stop   05/16/18 1600  DAPTOmycin (CUBICIN) 700 mg in sodium chloride 0.9 % IVPB     700 mg 228 mL/hr over 30 Minutes Intravenous Every 24 hours 05/16/18 0900     05/15/18 1600  DAPTOmycin (CUBICIN) 650 mg in sodium chloride 0.9 % IVPB  Status:  Discontinued     650 mg 226 mL/hr over 30 Minutes Intravenous Every 24 hours 05/15/18 0817 05/16/18 0900   05/15/18 0630  vancomycin (VANCOCIN) 2,000 mg in sodium chloride 0.9 % 500 mL IVPB     2,000 mg 250 mL/hr over 120 Minutes Intravenous  Once 05/15/18 0615 05/15/18 0947        Objective: Vitals:   05/16/18 0738 05/16/18 0745 05/16/18 1230 05/16/18 1236  BP:  (!) 123/93 (!) 127/92   Pulse:  Marland Kitchen)  56 (!) 56   Resp:  (!) 8 14   Temp: 97.9 F (36.6 C) 98 F (36.7 C)  (!) 97.5 F (36.4 C)  TempSrc: Oral Oral  Oral  SpO2:  98% 97%   Weight:      Height:        Intake/Output Summary (Last 24 hours) at 05/16/2018 1302 Last data filed at 05/16/2018 0447 Gross per 24 hour  Intake 1431.25 ml  Output 1125 ml  Net 306.25 ml   Filed Weights   05/15/18 1644 05/16/18 0302  Weight: 108.9 kg (240 lb) 117 kg (257 lb 15 oz)    Examination:  General exam: Appears calm and comfortable  Respiratory system: Clear to auscultation. Respiratory effort normal. Cardiovascular  system: S1 & S2 heard, RRR. No JVD, murmurs, rubs, gallops or clicks. No pedal edema. Gastrointestinal system: Abdomen is nondistended, soft and nontender. No organomegaly or masses felt. Normal bowel sounds heard. Central nervous system: Alert and oriented. No focal neurological deficits. Extremities: Symmetric 5 x 5 power. Skin: +RUE with erythema, which appears improved from the demarcated lines drawn by RN  Psychiatry: Judgement and insight appear normal. Mood & affect appropriate.   Data Reviewed: I have personally reviewed following labs and imaging studies  CBC: Recent Labs  Lab 05/15/18 0216 05/16/18 0404  WBC 12.9* 5.9  NEUTROABS 9.2*  --   HGB 11.4* 9.8*  HCT 36.7* 31.5*  MCV 80.8 82.0  PLT 315 265   Basic Metabolic Panel: Recent Labs  Lab 05/15/18 0216 05/15/18 0846 05/16/18 0404  NA 140  --  137  K 4.2  --  3.6  CL 106  --  110  CO2 23  --  22  GLUCOSE 118*  --  97  BUN 11  --  14  CREATININE 0.89  --  0.75  CALCIUM 8.8*  --  8.0*  MG  --  2.2  --   PHOS  --  2.6  --    GFR: Estimated Creatinine Clearance: 149.2 mL/min (by C-G formula based on SCr of 0.75 mg/dL). Liver Function Tests: Recent Labs  Lab 05/15/18 0216 05/16/18 0404  AST 35 13*  ALT 12 11  ALKPHOS 100 71  BILITOT 1.8* 0.6  PROT 7.3 6.2*  ALBUMIN 3.8 3.0*   No results for input(s): LIPASE, AMYLASE in the last 168 hours. No results for input(s): AMMONIA in the last 168 hours. Coagulation Profile: No results for input(s): INR, PROTIME in the last 168 hours. Cardiac Enzymes: No results for input(s): CKTOTAL, CKMB, CKMBINDEX, TROPONINI in the last 168 hours. BNP (last 3 results) No results for input(s): PROBNP in the last 8760 hours. HbA1C: No results for input(s): HGBA1C in the last 72 hours. CBG: No results for input(s): GLUCAP in the last 168 hours. Lipid Profile: No results for input(s): CHOL, HDL, LDLCALC, TRIG, CHOLHDL, LDLDIRECT in the last 72 hours. Thyroid Function  Tests: No results for input(s): TSH, T4TOTAL, FREET4, T3FREE, THYROIDAB in the last 72 hours. Anemia Panel: No results for input(s): VITAMINB12, FOLATE, FERRITIN, TIBC, IRON, RETICCTPCT in the last 72 hours. Sepsis Labs: Recent Labs  Lab 05/15/18 0231 05/15/18 0731 05/15/18 0745  LATICACIDVEN 1.2 0.6 0.62    Recent Results (from the past 240 hour(s))  Culture, blood (Routine X 2) w Reflex to ID Panel     Status: None (Preliminary result)   Collection Time: 05/15/18  7:31 AM  Result Value Ref Range Status   Specimen Description BLOOD LEFT FOOT LATERAL  Final   Special Requests   Final    BOTTLES DRAWN AEROBIC AND ANAEROBIC Blood Culture adequate volume   Culture   Final    NO GROWTH 1 DAY Performed at [facility], [address] [place], [place] [address]    Report Status PENDING  Incomplete  Culture, blood (Routine X 2) w Reflex to ID Panel     Status: None (Preliminary result)   Collection Time: 05/15/18  7:31 AM  Result Value Ref Range Status   Specimen Description BLOOD LEFT FOOT  Final   Special Requests   Final    BOTTLES DRAWN AEROBIC AND ANAEROBIC Blood Culture adequate volume   Culture   Final    NO GROWTH 1 DAY Performed at [facility], [address] [place], [place] [address]    Report Status PENDING  Incomplete  MRSA PCR Screening     Status: None   Collection Time: 05/15/18  6:45 PM  Result Value Ref Range Status   MRSA by PCR NEGATIVE NEGATIVE Final    Comment:        The GeneXpert MRSA Assay (FDA approved for NASAL specimens only), is one component of a comprehensive MRSA colonization surveillance program. It is not intended to diagnose MRSA infection nor to guide or monitor treatment for MRSA infections. Performed at [facility], [address] [place], [place] [address]        Radiology Studies: Dg Chest Port 1 View  Result Date: 05/15/2018 CLINICAL DATA:  Drug overdose.  Unresponsive EXAM: PORTABLE CHEST 1 VIEW  COMPARISON:  None. FINDINGS: Very low volume chest with generalized interstitial coarsening. Cardiomegaly and vascular pedicle widening which could be true disease or from low volumes. No visible effusion or pneumothorax. IMPRESSION: Low volume chest which may explain cardiomegaly and interstitial coarsening. Aspiration could also give this appearance. Electronically Signed   By: Marnee SpringJonathon  Watts M.D.   On: 05/15/2018 07:23      Scheduled Meds: . cloNIDine  0.1 mg Oral QID   Followed by  . [START ON 05/18/2018] cloNIDine  0.1 mg Oral BH-qamhs   Followed by  . [START ON 05/20/2018] cloNIDine  0.1 mg Oral QAC breakfast  . enoxaparin (LOVENOX) injection  0.5 mg/kg Subcutaneous Q24H  . ketorolac  15 mg Intravenous Q6H  . pantoprazole  40 mg Oral Daily  . sodium chloride flush  3 mL Intravenous Q12H   Continuous Infusions: . sodium chloride 100 mL/hr at 05/16/18 1228  . DAPTOmycin (CUBICIN)  IV       LOS: 0 days    Time spent: 25 minutes   Noralee StainJennifer Kelsey Edman, DO [facility] [email or web address] Password [email or web address] 05/16/2018, 1:02 PM

## 2018-05-17 ENCOUNTER — Encounter (HOSPITAL_COMMUNITY): Payer: Self-pay

## 2018-05-17 DIAGNOSIS — L0390 Cellulitis, unspecified: Secondary | ICD-10-CM

## 2018-05-17 DIAGNOSIS — T50901S Poisoning by unspecified drugs, medicaments and biological substances, accidental (unintentional), sequela: Secondary | ICD-10-CM

## 2018-05-17 LAB — CBC
HCT: 31.3 % — ABNORMAL LOW (ref 39.0–52.0)
Hemoglobin: 9.7 g/dL — ABNORMAL LOW (ref 13.0–17.0)
MCH: 25.7 pg — AB (ref 26.0–34.0)
MCHC: 31 g/dL (ref 30.0–36.0)
MCV: 82.8 fL (ref 78.0–100.0)
PLATELETS: 233 10*3/uL (ref 150–400)
RBC: 3.78 MIL/uL — ABNORMAL LOW (ref 4.22–5.81)
RDW: 14.5 % (ref 11.5–15.5)
WBC: 5.6 10*3/uL (ref 4.0–10.5)

## 2018-05-17 LAB — BASIC METABOLIC PANEL
Anion gap: 4 — ABNORMAL LOW (ref 5–15)
BUN: 11 mg/dL (ref 6–20)
CHLORIDE: 112 mmol/L — AB (ref 98–111)
CO2: 24 mmol/L (ref 22–32)
Calcium: 8.3 mg/dL — ABNORMAL LOW (ref 8.9–10.3)
Creatinine, Ser: 0.84 mg/dL (ref 0.61–1.24)
GFR calc Af Amer: >60 mL/min (ref >60)
GFR calc non Af Amer: >60 mL/min (ref >60)
Glucose, Bld: 98 mg/dL (ref 70–99)
Potassium: 3.8 mmol/L (ref 3.5–5.1)
Sodium: 140 mmol/L (ref 135–145)

## 2018-05-17 MED ORDER — SULFAMETHOXAZOLE-TRIMETHOPRIM 800-160 MG PO TABS: 1.0000 | ORAL_TABLET | Freq: Two times a day (BID) | ORAL | 0 refills | Status: AC

## 2018-05-17 NOTE — Progress Notes (Signed)
CSW received consult to visit with the patient and provide substance abuse resources. CSW attempted to provide the patient with resources but attempt was unsuccessful because  as soon as the CSW arrived in the patient's room the patient became agitated. He stated that he was leaving and he was leaving now and he begin to pull out his lines. CSW exited the room and alerted the RN.

## 2018-05-17 NOTE — Progress Notes (Signed)
Called into the room. Found charge nurse and AD attending to patient. He wants to leave AMA. Easily agitated. IV removed. MD made aware. Antibiotic prescription given. Patient signed AMA paper and left the hospital unaccompanied.

## 2018-05-17 NOTE — Progress Notes (Signed)
Pt HR rate sustained in 40s, other VS stable, mental status unchanged, no new complains by pt. Craige CottaKirby, NP notified.

## 2018-05-17 NOTE — Discharge Summary (Signed)
Joshua Jacobs, is a 42 y.o. male  DOB [birth year]  MRN [id number].  Admission date:  05/15/2018  Admitting Physician  Eduard Clos, MD  Discharge Date:  05/17/2018   Primary MD  System, Pcp Not In  Recommendations for primary care physician for things to follow:  -Patient left AMA, to discuss with patient, he is adamant about leaving AMA, gender stand risk of sepsis, systemic infection, loss of limp and possible death.  Have instructed him to come back to ED if he has any worsening pain, erythema or swelling or fever   Admission Diagnosis  Drug overdose [T50.901A] Polysubstance abuse (HCC) [F19.10] Drug overdose, undetermined intent, initial encounter [T50.904A] Cellulitis, unspecified cellulitis site [L03.90]   Discharge Diagnosis  Drug overdose [T50.901A] Polysubstance abuse (HCC) [F19.10] Drug overdose, undetermined intent, initial encounter [T50.904A] Cellulitis, unspecified cellulitis site [L03.90]    Principal Problem:   Drug overdose Active Problems:   Cellulitis   Polysubstance dependence (HCC)   IV drug user   Drug overdose, accidental or unintentional, initial encounter      Past Medical History:  Diagnosis Date  . Anxiety   . Chronic hip pain   . Degenerative joint disease   . Hemorrhoids   . History of MSSA bacteremia 08/2017  . IV drug abuse (HCC)   . Polysubstance abuse (HCC)   . PTSD (post-traumatic stress disorder)     Past Surgical History:  Procedure Laterality Date  . TEE WITHOUT CARDIOVERSION  08/2017       History of present illness and  Hospital Course:     Kindly see H&P for history of present illness and admission details, please review complete Labs, Consult reports and Test reports for all details in brief  HPI  from the history and physical done on the day of admission  HPI: Joshua Jacobs is a 42 y.o. male with medical history significant  for only substance abuse including heroin and cocaine, chronic pain resulting from degenerative joint disease of left hip, right rib fracture secondary to [facility] October 2018 with concurrent issues during same admission for MSSA bacteremia in the context of RUE cellulitis.  Patient was brought to the ED by friends because of abnormal behaviors and concerns of a possible overdose.  Friend was able to give limited history stating he was not sure with the patient took but also clarified by stating "I have never seen him like this".  Friend did attempt to give the patient Narcan before he was brought to the ER.  Patient presented to triage he was diaphoretic with intermittent slow respirations.  He demonstrated mumbling speech jerking movements.  Triage nurse noted multiple track marks.  Triage nurse also documented that the patient was mumbling that he possibly "shot up too soon".  Patient became aggressive and agitated and was yelling nonsense wounds and was thrashing about in the bed.  Because of his agitation EDP opted to give the patient a dose of Geodon as well as a dose of Ativan.  He was unable to give  any information regarding preadmission history or drug use.  In the ER patient was afebrile, normotensive, he was not hypoxemic.  He has remained sedated.  He was noted to have a leukocytosis of 12,900 with a left shift.  He was also found to have an indurated cellulitic area in the medial aspect of the right forearm and given his history of blood cultures were obtained and he was given an empiric dose of IV vancomycin.  Positive for opiates and cocaine.  All in all and aspirin levels were within normal limits, alcohol level was <10.  Patient remains sedated but stable and will be admitted to the stepdown unit for further observation and treatment.  Patient has very poor vascular access in the context of repeated IV drug abuse.  EDP has given order to place IVs in the feet and patient has 2 accesses in each  foot.  ED Course:  Vital Signs: BP 125/81   Pulse 85   Resp (!) 48   SpO2 93%  CXR: Low volume chest which may explain cardiomegaly and interstitial coarsening.  Aspiration could also give this appearance. Lab data: Sodium 140, potassium 4.2, chloride 106, CO2 23, glucose 118, BUN 11, creatinine 0.89, calcium 8.8, anion gap 11, LFTs normal except for mildly elevated total bilirubin at 1.8, lactic acid normal x2 collections, white count 12,900 with neutrophils 71% and absolute neutrophils 9.2%, hemoglobin 1.4, platelets 315,000, acetaminophen and salicylate levels within normal limits.  Alcohol less than 10, UDS as described above.  Blood cultures obtained in the ER Medications and treatments: Geodon 20 mg IM x1, Ativan 2 mg IV x1, vancomycin 2 g IV x1   Hospital Course  Drug overdose/drug abuse -Unintentional, positive for cocaine, he was counseled, alert x3, appropriate  Left upper extremity cellulitis -improving on IV vancomycin, afebrile, leukocytosis resolved, appears to be less red and tender on physical exam, no fluid collection appreciated, recent adamant about leaving AMA, I have discussed with the patient, still wants to leave, I will discharge him on 10 days of oral Bactrim, I have instructed him to come back to ED in case he has fever, worsening erythema or swelling or pain.        Discharge Condition:  Left  AM      Discharge Instructions  and  Discharge Medications     Allergies as of 05/17/2018      Reactions   Penicillins Anaphylaxis   Has patient had a PCN reaction causing immediate rash, facial/tongue/throat swelling, SOB or lightheadedness with hypotension: No Has patient had a PCN reaction causing severe rash involving mucus membranes or skin necrosis: No Has patient had a PCN reaction that required hospitalization: Yes Has patient had a PCN reaction occurring within the last 10 years: No If all of the above answers are "NO", then may proceed with  Cephalosporin use.      Medication List    STOP taking these medications   LORazepam 1 MG tablet Commonly known as:  ATIVAN   oxyCODONE 15 MG immediate release tablet Commonly known as:  ROXICODONE     TAKE these medications   sulfamethoxazole-trimethoprim 800-160 MG tablet Commonly known as:  BACTRIM DS,SEPTRA DS Take 1 tablet by mouth 2 (two) times daily for 10 days.         Diet and Activity recommendation: See Discharge Instructions above   Consults obtained -  None   Major procedures and Radiology Reports - PLEASE review detailed and final reports for all details, in brief -  Dg Chest Port 1 View  Result Date: 05/15/2018 CLINICAL DATA:  Drug overdose.  Unresponsive EXAM: PORTABLE CHEST 1 VIEW COMPARISON:  None. FINDINGS: Very low volume chest with generalized interstitial coarsening. Cardiomegaly and vascular pedicle widening which could be true disease or from low volumes. No visible effusion or pneumothorax. IMPRESSION: Low volume chest which may explain cardiomegaly and interstitial coarsening. Aspiration could also give this appearance. Electronically Signed   By: Marnee SpringJonathon  Watts M.D.   On: 05/15/2018 07:23    Micro Results    Recent Results (from the past 240 hour(s))  Culture, blood (Routine X 2) w Reflex to ID Panel     Status: None (Preliminary result)   Collection Time: 05/15/18  7:31 AM  Result Value Ref Range Status   Specimen Description BLOOD LEFT FOOT LATERAL  Final   Special Requests   Final    BOTTLES DRAWN AEROBIC AND ANAEROBIC Blood Culture adequate volume   Culture   Final    NO GROWTH 2 DAYS Performed at [facility], [address] [place], [place] [address]    Report Status PENDING  Incomplete  Culture, blood (Routine X 2) w Reflex to ID Panel     Status: None (Preliminary result)   Collection Time: 05/15/18  7:31 AM  Result Value Ref Range Status   Specimen Description BLOOD LEFT FOOT  Final   Special Requests   Final     BOTTLES DRAWN AEROBIC AND ANAEROBIC Blood Culture adequate volume   Culture   Final    NO GROWTH 2 DAYS Performed at [facility], [address] [place], [place] [address]    Report Status PENDING  Incomplete  MRSA PCR Screening     Status: None   Collection Time: 05/15/18  6:45 PM  Result Value Ref Range Status   MRSA by PCR NEGATIVE NEGATIVE Final    Comment:        The GeneXpert MRSA Assay (FDA approved for NASAL specimens only), is one component of a comprehensive MRSA colonization surveillance program. It is not intended to diagnose MRSA infection nor to guide or monitor treatment for MRSA infections. Performed at [facility], [address] [place], [place] [address]        Today   Subjective:   Joshua Jacobs today has no headache,no chest or abdominal pain, denies any suicidal thoughts or ideations  Objective:   Blood pressure 127/90, pulse (!) 48, temperature 97.6 F (36.4 C), temperature source Oral, resp. rate 10, height 5\' 6"  (1.676 m), weight 117 kg (257 lb 15 oz), SpO2 97 %.   Intake/Output Summary (Last 24 hours) at 05/17/2018 1127 Last data filed at 05/17/2018 1000 Gross per 24 hour  Intake 1146.93 ml  Output 2175 ml  Net -1028.07 ml    Exam Awake Alert, Oriented x 3, No new F.N deficits, Normal affect.  Symmetrical Chest wall movement, Good air movement bilaterally, CTAB RRR,No Gallops,Rubs or new Murmurs, No Parasternal Heave +ve B.Sounds, Abd Soft, Non tender,, No rebound -guarding or rigidity. Upper extremity and letter for area infection at site of his IV injection, appears to be trying,/crusting, no active discharge, no fluctuation or fluid collection could be felt, he does have some mild erythema around injection site  Data Review   CBC w Diff:  Lab Results  Component Value Date   WBC 5.6 05/17/2018   HGB 9.7 (L) 05/17/2018   HCT 31.3 (L) 05/17/2018   PLT 233 05/17/2018  LYMPHOPCT 17 05/15/2018   MONOPCT 9 05/15/2018    EOSPCT 1 05/15/2018   BASOPCT 1 05/15/2018    CMP:  Lab Results  Component Value Date   NA 140 05/17/2018   K 3.8 05/17/2018   CL 112 (H) 05/17/2018   CO2 24 05/17/2018   BUN 11 05/17/2018   CREATININE 0.84 05/17/2018   PROT 6.2 (L) 05/16/2018   ALBUMIN 3.0 (L) 05/16/2018   BILITOT 0.6 05/16/2018   ALKPHOS 71 05/16/2018   AST 13 (L) 05/16/2018   ALT 11 05/16/2018  .   Total Time in preparing paper work, data evaluation and todays exam - 35 minutes  Huey Bienenstock M.D on 05/17/2018 at 11:27 AM  [facility]   Office  [phone]

## 2018-05-20 LAB — CULTURE, BLOOD (ROUTINE X 2)
CULTURE: NO GROWTH
Culture: NO GROWTH
Special Requests: ADEQUATE
Special Requests: ADEQUATE

## 2018-05-30 ENCOUNTER — Inpatient Hospital Stay: Payer: Self-pay

## 2018-07-06 ENCOUNTER — Emergency Department (HOSPITAL_COMMUNITY): Payer: Self-pay

## 2018-07-06 ENCOUNTER — Emergency Department (HOSPITAL_COMMUNITY)
Admission: EM | Admit: 2018-07-06 | Discharge: 2018-07-06 | Disposition: A | Discharge status: Home / Self Care | Payer: Self-pay | Attending: Emergency Medicine | Admitting: Emergency Medicine

## 2018-07-06 ENCOUNTER — Other Ambulatory Visit: Payer: Self-pay

## 2018-07-06 ENCOUNTER — Encounter (HOSPITAL_COMMUNITY): Payer: Self-pay | Admitting: Emergency Medicine

## 2018-07-06 DIAGNOSIS — Z79899 Other long term (current) drug therapy: Secondary | ICD-10-CM | POA: Insufficient documentation

## 2018-07-06 DIAGNOSIS — Z87891 Personal history of nicotine dependence: Secondary | ICD-10-CM | POA: Insufficient documentation

## 2018-07-06 DIAGNOSIS — L03114 Cellulitis of left upper limb: Secondary | ICD-10-CM | POA: Insufficient documentation

## 2018-07-06 DIAGNOSIS — R0789 Other chest pain: Secondary | ICD-10-CM | POA: Insufficient documentation

## 2018-07-06 LAB — BASIC METABOLIC PANEL
Anion gap: 11 (ref 5–15)
BUN: 22 mg/dL — AB (ref 6–20)
CHLORIDE: 103 mmol/L (ref 98–111)
CO2: 26 mmol/L (ref 22–32)
Calcium: 9.4 mg/dL (ref 8.9–10.3)
Creatinine, Ser: 1.08 mg/dL (ref 0.61–1.24)
GFR calc Af Amer: >60 mL/min (ref >60)
GFR calc non Af Amer: >60 mL/min (ref >60)
GLUCOSE: 102 mg/dL — AB (ref 70–99)
Potassium: 4.1 mmol/L (ref 3.5–5.1)
Sodium: 140 mmol/L (ref 135–145)

## 2018-07-06 LAB — CBC
HEMATOCRIT: 35.1 % — AB (ref 39.0–52.0)
Hemoglobin: 11.2 g/dL — ABNORMAL LOW (ref 13.0–17.0)
MCH: 25.9 pg — AB (ref 26.0–34.0)
MCHC: 31.9 g/dL (ref 30.0–36.0)
MCV: 81.1 fL (ref 78.0–100.0)
PLATELETS: 457 10*3/uL — AB (ref 150–400)
RBC: 4.33 MIL/uL (ref 4.22–5.81)
RDW: 14.2 % (ref 11.5–15.5)
WBC: 12.8 10*3/uL — ABNORMAL HIGH (ref 4.0–10.5)

## 2018-07-06 LAB — POCT I-STAT TROPONIN I: Troponin i, poc: 0 ng/mL (ref 0.00–0.08)

## 2018-07-06 MED ORDER — DOXYCYCLINE HYCLATE 100 MG PO CAPS: 100.0000 mg | ORAL_CAPSULE | Freq: Two times a day (BID) | ORAL | 0 refills | Status: AC

## 2018-07-06 MED ORDER — VANCOMYCIN HCL 10 G IV SOLR
1750.0000 mg | Freq: Once | INTRAVENOUS | Status: AC
  Administered 2018-07-06: 1750 mg via INTRAVENOUS
  Filled 2018-07-06: qty 1750

## 2018-07-06 NOTE — ED Notes (Signed)
Vancomycin continues to infuse at this time, patient updated regarding plan of care, plan to discharge after infusion.

## 2018-07-06 NOTE — ED Triage Notes (Signed)
Pt presents to the ED with complaints of chest pain that radiated to his shoulder and an abscess on his anterior left forearm that is currently draining. Area is red, hot and swollen.

## 2018-07-06 NOTE — ED Provider Notes (Signed)
[facility]-EMERGENCY DEPT Provider Note: Joshua DellJ. Lane Amarri Michaelson, MD, FACEP  CSN: [id number] MRN: [id number] ARRIVAL: 07/06/18 at 0156 ROOM: WA21/WA21   CHIEF COMPLAINT  Abscess and Chest Pain   HISTORY OF PRESENT ILLNESS  07/06/18 4:53 AM Janetta HoraSteven Peloquin is a 42 y.o. male with a history of chronic pain, IV heroin abuse and frequent abscesses.  He is here with a 1 month history of chest pain.  The chest pain is located in the epigastrium and is described as having sharp, pressure-like, and throbbing components.  It is moderate in severity.  It is episodic.  It tends to come on when he is anxious or when he awakens from sleep after a night terror.  When he awakens from sleep after a night. Her he often has several minutes of associated shortness of breath that then resolves.  The pain may be present for as little as a few minutes or is much as an hour.  He was admitted last month and had echocardiograms which were unremarkable.  He is also complaining of tender, erythematous area to his left elbow.  It is moderately painful and he has had some sanguinous and purulent drainage from it.  Symptoms are consistent with previous abscesses.    Past Medical History:  Diagnosis Date  . Anxiety   . Chronic hip pain   . Degenerative joint disease   . Degenerative joint disease of left hip   . Hemorrhoids   . History of MSSA bacteremia 08/2017  . IV drug abuse (HCC)   . Opioid abuse (HCC)   . Polysubstance abuse (HCC)   . PTSD (post-traumatic stress disorder)   . Sweating abnormality     Past Surgical History:  Procedure Laterality Date  . TEE WITHOUT CARDIOVERSION N/A 08/18/2017   Procedure: TRANSESOPHAGEAL ECHOCARDIOGRAM (TEE);  Surgeon: Lewayne Buntingrenshaw, Brian S, MD;  [facility];  Service: Cardiovascular;  Laterality: N/A;  . TEE WITHOUT CARDIOVERSION  08/2017    Family History  Problem Relation Age of Onset  . Hypertension Mother   . Hypertension Father   . Lung cancer Maternal Uncle      Social History   Tobacco Use  . Smoking status: Former Smoker    Packs/day: 1.00    Years: 21.00    Pack years: 21.00    Types: Cigarettes    Last attempt to quit: 08/19/2017    Years since quitting: 0.8  . Smokeless tobacco: Never Used  Substance Use Topics  . Alcohol use: No  . Drug use: Yes    Types: Heroin, Cocaine, Methaqualone, IV    Prior to Admission medications   Medication Sig Start Date End Date Taking? Authorizing Provider  acetaminophen (TYLENOL) 500 MG tablet Take 500 mg by mouth every 6 (six) hours as needed for moderate pain or headache.   Yes [provider]  aspirin-acetaminophen-caffeine (EXCEDRIN MIGRAINE) 303 403 [id number] MG tablet Take 1-2 tablets by mouth every 6 (six) hours as needed for headache or migraine.   Yes [provider]  buprenorphine (SUBUTEX) 2 MG SUBL SL tablet Place 6 mg under the tongue daily.  06/09/18  Yes [provider]  gabapentin (NEURONTIN) 300 MG capsule Take 900 mg by mouth 3 (three) times daily.   Yes [provider]  LORazepam (ATIVAN) 1 MG tablet Take 1 tablet (1 mg total) by mouth at bedtime. Patient taking differently: Take 1 mg by mouth 3 (three) times daily.  08/29/17  Yes Carlis Stableummings, Charley Elizabeth, PA-C  meloxicam (MOBIC) 7.5 MG tablet Take  2 tablets (15 mg total) by mouth daily. 04/14/18  Yes Roxy Horseman, PA-C  diclofenac (VOLTAREN) 75 MG EC tablet Take 1 tablet (75 mg total) by mouth 2 (two) times daily. Patient not taking: Reported on 04/14/2018 08/29/17   Carlis Stable, PA-C  hydrOXYzine (ATARAX/VISTARIL) 25 MG tablet Take 1 tablet (25 mg total) by mouth every 6 (six) hours. Patient not taking: Reported on 07/06/2018 04/14/18   Roxy Horseman, PA-C    Allergies Penicillins and Penicillins   REVIEW OF SYSTEMS  Negative except as noted here or in the History of Present Illness.   PHYSICAL EXAMINATION  Initial Vital Signs There were no vitals taken for this  visit.  Examination General: Well-developed, obese male in no acute distress; appearance consistent with age of record HENT: normocephalic; atraumatic Eyes: pupils equal, round and reactive to light; extraocular muscles intact Neck: supple Heart: regular rate and rhythm; no murmur Lungs: clear to auscultation bilaterally Abdomen: soft; nondistended; mild epigastric tenderness; bowel sounds present Extremities: No deformity; full range of motion; pulses normal Neurologic: Awake, alert and oriented; motor function intact in all extremities and symmetric; no facial droop Skin: Warm and dry; multiple track marks; tender, erythematous area on left elbow, no pus pocket seen on bedside ultrasound:    Psychiatric: Normal mood and affect   RESULTS  Summary of this visit's results, reviewed by myself:   EKG Interpretation  Date/Time:  Thursday July 06 2018 02:23:30 EDT Ventricular Rate:  89 PR Interval:    QRS Duration: 91 QT Interval:  412 QTC Calculation: 502 R Axis:   -51 Text Interpretation:  Sinus rhythm Probable left atrial enlargement Left anterior fascicular block RSR' in V1 or V2, right VCD or RVH Prolonged QT interval No significant change was found Confirmed by Paula Libra ([id number]) on 07/06/2018 2:27:27 AM      Laboratory Studies: Results for orders placed or performed during the hospital encounter of 07/06/18 (from the past 24 hour(s))  Basic metabolic panel     Status: Abnormal   Collection Time: 07/06/18  2:43 AM  Result Value Ref Range   Sodium 140 135 - 145 mmol/L   Potassium 4.1 3.5 - 5.1 mmol/L   Chloride 103 98 - 111 mmol/L   CO2 26 22 - 32 mmol/L   Glucose, Bld 102 (H) 70 - 99 mg/dL   BUN 22 (H) 6 - 20 mg/dL   Creatinine, Ser 6.04 0.61 - 1.24 mg/dL   Calcium 9.4 8.9 - 54.0 mg/dL   GFR calc non Af Amer >60 >60 mL/min   GFR calc Af Amer >60 >60 mL/min   Anion gap 11 5 - 15  CBC     Status: Abnormal   Collection Time: 07/06/18  2:43 AM  Result Value Ref  Range   WBC 12.8 (H) 4.0 - 10.5 K/uL   RBC 4.33 4.22 - 5.81 MIL/uL   Hemoglobin 11.2 (L) 13.0 - 17.0 g/dL   HCT 98.1 (L) 19.1 - 47.8 %   MCV 81.1 78.0 - 100.0 fL   MCH 25.9 (L) 26.0 - 34.0 pg   MCHC 31.9 30.0 - 36.0 g/dL   RDW 29.5 62.1 - 30.8 %   Platelets 457 (H) 150 - 400 K/uL  POCT i-Stat troponin I     Status: None   Collection Time: 07/06/18  5:36 AM  Result Value Ref Range   Troponin i, poc 0.00 0.00 - 0.08 ng/mL   Comment 3  Imaging Studies: Dg Chest 2 View  Result Date: 07/06/2018 CLINICAL DATA:  Mid chest pain. EXAM: CHEST - 2 VIEW COMPARISON:  Radiograph 04/14/2018, CT 08/10/2017 FINDINGS: The cardiomediastinal contours are normal for AP technique. The lungs are clear. Pulmonary vasculature is normal. No consolidation, pleural effusion, or pneumothorax. No acute osseous abnormalities are seen. IMPRESSION: No acute pulmonary process. Electronically Signed   By: Rubye Oaks M.D.   On: 07/06/2018 03:11    ED COURSE and MDM  Nursing notes and initial vitals signs, including pulse oximetry, reviewed.  Vitals:   07/06/18 0523  BP: 124/76  Pulse: 75  Resp: 18  SpO2: 97%   6:50 AM EKG unchanged.  Troponin negative.  Pain is atypical and has been occurring for at least a month.  Patient given 1.75 g of vancomycin for cellulitis of left elbow.  No drainable pus pocket was seen on bedside ultrasound.  We will discharge him on doxycycline and advise him to return if symptoms worsen.  PROCEDURES    ED DIAGNOSES     ICD-10-CM   1. Atypical chest pain R07.89   2. Cellulitis of left elbow L03.114        Wilhelmina Hark, MD 07/06/18 [phone]

## 2018-07-06 NOTE — ED Notes (Addendum)
RN went to patients room after patient presenting in hallway, wrapping own arm in roll of gauze, agitated, pacing.  Upon entering room, patient is pacing back and forth stating "I dont know why I cant get help" packing up belongings in bag.  "Pts IV was on the floor, still running on IV pump.  When asked how his IV came out of his arm, pt stated "I dont know, I didn't pull it out if that's what youre saying." RN stated she made no assumptions as to how IV became dislodged, only was inquiring as to what happened and why he was agitated.  Pt stated "I guess Im leaving so I can get some help around here. Im going to a hospital that will actually help me"  RN informed patient that his medication was not completely administered, pt refused to accept further meds. Pt also had removed blood pressure cuff and cardiac monitor, unable to assess vitals prior to pt departure.  Pt was agitated, diaphoretic, covered in blood, speaking rapidly prior to departure.  RN ensured all IVs removed prior to his leaving. Multiple staff saw patient leaving department, partially clothed, gait was steady. NAD.  Charge RN aware. Pt did not sign discharge paperwork, left before discharge paperwork could be given or reviewed.

## 2018-07-29 ENCOUNTER — Other Ambulatory Visit: Payer: Self-pay

## 2018-07-29 ENCOUNTER — Emergency Department (HOSPITAL_COMMUNITY): Payer: Self-pay

## 2018-07-29 ENCOUNTER — Inpatient Hospital Stay (HOSPITAL_COMMUNITY)
Admission: EM | Admit: 2018-07-29 | Discharge: 2018-07-30 | DRG: 603 | Discharge status: Against Medical Advice | Payer: Self-pay | Attending: Internal Medicine | Admitting: Internal Medicine

## 2018-07-29 ENCOUNTER — Encounter (HOSPITAL_COMMUNITY): Payer: Self-pay

## 2018-07-29 DIAGNOSIS — Z66 Do not resuscitate: Secondary | ICD-10-CM | POA: Diagnosis present

## 2018-07-29 DIAGNOSIS — Z791 Long term (current) use of non-steroidal anti-inflammatories (NSAID): Secondary | ICD-10-CM

## 2018-07-29 DIAGNOSIS — E875 Hyperkalemia: Secondary | ICD-10-CM | POA: Diagnosis present

## 2018-07-29 DIAGNOSIS — L02512 Cutaneous abscess of left hand: Secondary | ICD-10-CM | POA: Diagnosis present

## 2018-07-29 DIAGNOSIS — F1920 Other psychoactive substance dependence, uncomplicated: Secondary | ICD-10-CM | POA: Diagnosis present

## 2018-07-29 DIAGNOSIS — F4310 Post-traumatic stress disorder, unspecified: Secondary | ICD-10-CM | POA: Diagnosis present

## 2018-07-29 DIAGNOSIS — D649 Anemia, unspecified: Secondary | ICD-10-CM | POA: Diagnosis present

## 2018-07-29 DIAGNOSIS — F1110 Opioid abuse, uncomplicated: Secondary | ICD-10-CM | POA: Diagnosis present

## 2018-07-29 DIAGNOSIS — F1990 Other psychoactive substance use, unspecified, uncomplicated: Secondary | ICD-10-CM | POA: Diagnosis present

## 2018-07-29 DIAGNOSIS — Z880 Allergy status to penicillin: Secondary | ICD-10-CM

## 2018-07-29 DIAGNOSIS — F419 Anxiety disorder, unspecified: Secondary | ICD-10-CM | POA: Diagnosis present

## 2018-07-29 DIAGNOSIS — D509 Iron deficiency anemia, unspecified: Secondary | ICD-10-CM | POA: Diagnosis present

## 2018-07-29 DIAGNOSIS — K056 Periodontal disease, unspecified: Secondary | ICD-10-CM | POA: Diagnosis present

## 2018-07-29 DIAGNOSIS — Z79899 Other long term (current) drug therapy: Secondary | ICD-10-CM

## 2018-07-29 DIAGNOSIS — E871 Hypo-osmolality and hyponatremia: Secondary | ICD-10-CM | POA: Diagnosis present

## 2018-07-29 DIAGNOSIS — Z87891 Personal history of nicotine dependence: Secondary | ICD-10-CM

## 2018-07-29 DIAGNOSIS — L03211 Cellulitis of face: Principal | ICD-10-CM | POA: Diagnosis present

## 2018-07-29 LAB — CBC WITH DIFFERENTIAL/PLATELET
BASOS ABS: 0 10*3/uL (ref 0.0–0.1)
BASOS PCT: 0 %
Eosinophils Absolute: 0.2 10*3/uL (ref 0.0–0.7)
Eosinophils Relative: 2 %
HEMATOCRIT: 36.3 % — AB (ref 39.0–52.0)
HEMOGLOBIN: 11.9 g/dL — AB (ref 13.0–17.0)
LYMPHS PCT: 20 %
Lymphs Abs: 2.1 10*3/uL (ref 0.7–4.0)
MCH: 26.3 pg (ref 26.0–34.0)
MCHC: 32.8 g/dL (ref 30.0–36.0)
MCV: 80.3 fL (ref 78.0–100.0)
Monocytes Absolute: 1.3 10*3/uL — ABNORMAL HIGH (ref 0.1–1.0)
Monocytes Relative: 12 %
Neutro Abs: 7.2 10*3/uL (ref 1.7–7.7)
Neutrophils Relative %: 66 %
Platelets: 243 10*3/uL (ref 150–400)
RBC: 4.52 MIL/uL (ref 4.22–5.81)
RDW: 14.7 % (ref 11.5–15.5)
WBC: 10.8 10*3/uL — ABNORMAL HIGH (ref 4.0–10.5)

## 2018-07-29 LAB — BASIC METABOLIC PANEL
ANION GAP: 9 (ref 5–15)
BUN: 18 mg/dL (ref 6–20)
CALCIUM: 9.2 mg/dL (ref 8.9–10.3)
CO2: 26 mmol/L (ref 22–32)
Chloride: 98 mmol/L (ref 98–111)
Creatinine, Ser: 0.97 mg/dL (ref 0.61–1.24)
GLUCOSE: 100 mg/dL — AB (ref 70–99)
Potassium: 6.2 mmol/L — ABNORMAL HIGH (ref 3.5–5.1)
Sodium: 133 mmol/L — ABNORMAL LOW (ref 135–145)

## 2018-07-29 LAB — I-STAT CG4 LACTIC ACID, ED: Lactic Acid, Venous: 1.71 mmol/L (ref 0.5–1.9)

## 2018-07-29 LAB — RAPID URINE DRUG SCREEN, HOSP PERFORMED
Amphetamines: NOT DETECTED
BARBITURATES: NOT DETECTED
BENZODIAZEPINES: NOT DETECTED
COCAINE: POSITIVE — AB
Opiates: NOT DETECTED
TETRAHYDROCANNABINOL: NOT DETECTED

## 2018-07-29 MED ORDER — LORAZEPAM 0.5 MG PO TABS: 0.5000 mg | ORAL_TABLET | Freq: Three times a day (TID) | ORAL | Status: DC | PRN

## 2018-07-29 MED ORDER — BUPRENORPHINE HCL-NALOXONE HCL 2-0.5 MG SL SUBL: 3.0000 | SUBLINGUAL_TABLET | Freq: Every day | SUBLINGUAL | Status: DC

## 2018-07-29 MED ORDER — ONDANSETRON HCL 4 MG/2ML IJ SOLN: 4.0000 mg | Freq: Four times a day (QID) | INTRAMUSCULAR | Status: DC | PRN

## 2018-07-29 MED ORDER — ACETAMINOPHEN 650 MG RE SUPP: 650.0000 mg | Freq: Four times a day (QID) | RECTAL | Status: DC | PRN

## 2018-07-29 MED ORDER — KETOROLAC TROMETHAMINE 30 MG/ML IJ SOLN
30.0000 mg | Freq: Four times a day (QID) | INTRAMUSCULAR | Status: DC | PRN
  Administered 2018-07-30: 30 mg via INTRAVENOUS
  Filled 2018-07-29: qty 1

## 2018-07-29 MED ORDER — VANCOMYCIN HCL 10 G IV SOLR
2000.0000 mg | Freq: Once | INTRAVENOUS | Status: AC
  Administered 2018-07-29: 2000 mg via INTRAVENOUS
  Filled 2018-07-29: qty 2000

## 2018-07-29 MED ORDER — CLINDAMYCIN PHOSPHATE 600 MG/50ML IV SOLN: 600.0000 mg | Freq: Once | INTRAVENOUS | Status: DC

## 2018-07-29 MED ORDER — ALBUTEROL SULFATE (2.5 MG/3ML) 0.083% IN NEBU: 2.5000 mg | INHALATION_SOLUTION | Freq: Four times a day (QID) | RESPIRATORY_TRACT | Status: DC | PRN

## 2018-07-29 MED ORDER — LORAZEPAM 2 MG/ML IJ SOLN
1.0000 mg | Freq: Once | INTRAMUSCULAR | Status: AC
  Administered 2018-07-29: 1 mg via INTRAVENOUS

## 2018-07-29 MED ORDER — ENOXAPARIN SODIUM 40 MG/0.4ML ~~LOC~~ SOLN: 40.0000 mg | Freq: Every day | SUBCUTANEOUS | Status: DC

## 2018-07-29 MED ORDER — NALOXONE HCL 2 MG/2ML IJ SOSY: 1.0000 mg | PREFILLED_SYRINGE | Freq: Once | INTRAMUSCULAR | Status: DC

## 2018-07-29 MED ORDER — SODIUM CHLORIDE 0.9 % IV BOLUS
1000.0000 mL | Freq: Once | INTRAVENOUS | Status: AC
  Administered 2018-07-29: 1000 mL via INTRAVENOUS

## 2018-07-29 MED ORDER — ACETAMINOPHEN 325 MG PO TABS
650.0000 mg | ORAL_TABLET | Freq: Four times a day (QID) | ORAL | Status: DC | PRN
  Administered 2018-07-30: 650 mg via ORAL
  Filled 2018-07-29: qty 2

## 2018-07-29 MED ORDER — SODIUM CHLORIDE 0.9 % IJ SOLN
INTRAMUSCULAR | Status: AC
  Filled 2018-07-29: qty 50

## 2018-07-29 MED ORDER — SODIUM CHLORIDE 0.9 % IV SOLN
INTRAVENOUS | Status: DC
  Administered 2018-07-30: 01:00:00 via INTRAVENOUS

## 2018-07-29 MED ORDER — IOHEXOL 300 MG/ML  SOLN
75.0000 mL | Freq: Once | INTRAMUSCULAR | Status: AC | PRN
  Administered 2018-07-29: 75 mL via INTRAVENOUS

## 2018-07-29 MED ORDER — LORAZEPAM 2 MG/ML IJ SOLN
INTRAMUSCULAR | Status: AC
  Filled 2018-07-29: qty 1

## 2018-07-29 MED ORDER — NALOXONE HCL 2 MG/2ML IJ SOSY
PREFILLED_SYRINGE | INTRAMUSCULAR | Status: AC
  Administered 2018-07-29: 1 mg
  Filled 2018-07-29: qty 2

## 2018-07-29 MED ORDER — SODIUM CHLORIDE 0.9% FLUSH: 3.0000 mL | Freq: Two times a day (BID) | INTRAVENOUS | Status: DC

## 2018-07-29 MED ORDER — ONDANSETRON HCL 4 MG PO TABS: 4.0000 mg | ORAL_TABLET | Freq: Four times a day (QID) | ORAL | Status: DC | PRN

## 2018-07-29 MED ORDER — ONDANSETRON HCL 4 MG/2ML IJ SOLN
INTRAMUSCULAR | Status: AC
  Administered 2018-07-29: 4 mg
  Filled 2018-07-29: qty 2

## 2018-07-29 MED ORDER — LORAZEPAM 1 MG PO TABS: 1.0000 mg | ORAL_TABLET | Freq: Three times a day (TID) | ORAL | Status: DC

## 2018-07-29 NOTE — ED Notes (Signed)
Bed: WA20 Expected date:  Expected time:  Means of arrival:  Comments: Tr. 2

## 2018-07-29 NOTE — ED Notes (Signed)
Patient very lethargic, can't keep eyes open, can hardly sit up. MD made aware.

## 2018-07-29 NOTE — ED Notes (Signed)
Patient went to bathroom and suddenly started c/o anxiety attack. Would not cooperate and get back in bed. MD made aware.

## 2018-07-29 NOTE — H&P (Signed)
History and Physical    [name] UUV:[id number] DOB: [birth year] DOA: 07/29/2018  Referring MD/NP/PA: Graciella Freer, PA-C PCP: Patient, No Pcp Per  Patient coming from: Home via EMS  Chief Complaint: Tooth painand facial swelling.  I have personally briefly reviewed patient's old medical records in [facility]   HPI: Joshua Jacobs is a 42 y.o. male with medical history significant of IV drug abuse(heroin and cocaine), anxiety, PTSD, MSSA bacteremia, chronic pain 2/2 degenerative joint disease; who presents with complaints of left tooth pain and facial swelling since last night.  Patient has been dealing with left tooth that needs to come out for several months.  He reports lack of insurance and therefore has not been able to follow-up with a dentist.  He woke up to significant swelling, redness, and severe pain of the left side of his jaw.  Swelling spread up to his left eye causing it to swell shut.  Denies having any fever, shortness of breath, wheezing, or difficulty swallowing secretions at this time he reports taking a 15 mg Roxicodone tablet for pain symptoms that he obtained from a friend without improvement.  He also admits to last using cocaine a few days ago.  ED Course: Upon admission into the emergency department patient was seen to be afebrile, blood pressure 133/97- 167/116,   respirations 9-26, and O2 saturation maintained on room air.  Labs revealed WBC 10.8, sodium 133, potassium 6.2, and lactic acid 1.71.  CT angiogram of the chest patient reportedly went to the bathroom and took some unknown substance and became very somnolent requiring Narcan.  Patient was given 1 mg of Ativan as well about 30 minutes prior to symptoms.   Review of Systems  Constitutional: Positive for malaise/fatigue.  HENT:       Positive for tooth pain and jaw swelling  Eyes: Positive for blurred vision. Negative for discharge.  Respiratory: Negative for cough and shortness of breath.     Cardiovascular: Negative for chest pain and claudication.  Gastrointestinal: Negative for abdominal pain, nausea and vomiting.  Genitourinary: Negative for dysuria and frequency.  Musculoskeletal: Negative for falls.  Skin: Positive for rash.  Neurological: Negative for focal weakness and loss of consciousness.  Psychiatric/Behavioral: Positive for substance abuse. Negative for suicidal ideas.    Past Medical History:  Diagnosis Date  . Anxiety   . Chronic hip pain   . Degenerative joint disease   . Degenerative joint disease of left hip   . Hemorrhoids   . History of MSSA bacteremia 08/2017  . IV drug abuse (HCC)   . Opioid abuse (HCC)   . Polysubstance abuse (HCC)   . PTSD (post-traumatic stress disorder)   . Sweating abnormality     Past Surgical History:  Procedure Laterality Date  . TEE WITHOUT CARDIOVERSION N/A 08/18/2017   Procedure: TRANSESOPHAGEAL ECHOCARDIOGRAM (TEE);  Surgeon: Lewayne Bunting, MD;  Location: [facility] ENDOSCOPY;  Service: Cardiovascular;  Laterality: N/A;  . TEE WITHOUT CARDIOVERSION  08/2017     reports that he quit smoking about 11 months ago. His smoking use included cigarettes. He has a 21.00 pack-year smoking history. He has never used smokeless tobacco. He reports that he has current or past drug history. Drugs: Heroin, Cocaine, Methaqualone, and IV. He reports that he does not drink alcohol.  Allergies  Allergen Reactions  . Penicillins Anaphylaxis    Has patient had a PCN reaction causing immediate rash, facial/tongue/throat swelling, SOB or lightheadedness with hypotension: Yes Has patient had a PCN  reaction causing severe rash involving mucus membranes or skin necrosis: No Has patient had a PCN reaction that required hospitalization Yes Has patient had a PCN reaction occurring within the last 10 years: No If all of the above answers are "NO", then may proceed with Cephalosporin  Childhood PCN > described as chest closing/tightness.   Marland Kitchen  Penicillins Anaphylaxis    Has patient had a PCN reaction causing immediate rash, facial/tongue/throat swelling, SOB or lightheadedness with hypotension: No Has patient had a PCN reaction causing severe rash involving mucus membranes or skin necrosis: No Has patient had a PCN reaction that required hospitalization: Yes Has patient had a PCN reaction occurring within the last 10 years: No If all of the above answers are "NO", then may proceed with Cephalosporin use.    Family History  Problem Relation Age of Onset  . Hypertension Mother   . Hypertension Father   . Lung cancer Maternal Uncle     Prior to Admission medications   Medication Sig Start Date End Date Taking? Authorizing Provider  acetaminophen (TYLENOL) 500 MG tablet Take 500 mg by mouth every 6 (six) hours as needed for moderate pain or headache.   Yes [provider]  buprenorphine (SUBUTEX) 2 MG SUBL SL tablet Place 6 mg under the tongue daily.  06/09/18  Yes [provider]  LORazepam (ATIVAN) 1 MG tablet Take 1 tablet (1 mg total) by mouth at bedtime. Patient taking differently: Take 1 mg by mouth 3 (three) times daily.  08/29/17  Yes Carlis Stable, PA-C  meloxicam (MOBIC) 7.5 MG tablet Take 2 tablets (15 mg total) by mouth daily. 04/14/18  Yes Roxy Horseman, PA-C    Physical Exam:  Constitutional: NAD, calm, comfortable Vitals:   07/29/18 1728 07/29/18 1930  BP: (!) 158/102 (!) 142/84  Pulse: 78   Resp: 17 17  Temp: 98.8 F (37.1 C)   TempSrc: Oral   SpO2: 95%    Eyes: PERRL, periorbital swelling of the left eye present. ENMT: Mucous membranes are moist. Posterior pharynx clear of any exudate or lesions. Poor dentition with multiple dental caries. Neck: normal, supple, no masses, no thyromegaly  Respiratory: clear to auscultation bilaterally, no wheezing, no crackles. Normal respiratory effort. No accessory muscle use.  Cardiovascular: Regular rate and rhythm, no murmurs / rubs  / gallops. No extremity edema. 2+ pedal pulses. No carotid bruits.  Abdomen: no tenderness, no masses palpated. No hepatosplenomegaly. Bowel sounds positive.  Musculoskeletal: no clubbing / cyanosis. No joint deformity upper and lower extremities. Good ROM, no contractures. Normal muscle tone.  Skin: Swelling erythema of the left side of face present.  Multiple healed injection marks noted of the bilateral extremities.  There is a quarter sized area of swelling and erythema overlying the second MCP joint. Neurologic: CN 2-12 grossly intact. Sensation intact, DTR normal. Strength 5/5 in all 4.  Psychiatric: Poor judgment and insight. Alert and oriented x 3.  Flat affect.     Labs on Admission: I have personally reviewed following labs and imaging studies  CBC: Recent Labs  Lab 07/29/18 1932  WBC 10.8*  NEUTROABS 7.2  HGB 11.9*  HCT 36.3*  MCV 80.3  PLT 243   Basic Metabolic Panel: Recent Labs  Lab 07/29/18 1932  NA 133*  K 6.2*  CL 98  CO2 26  GLUCOSE 100*  BUN 18  CREATININE 0.97  CALCIUM 9.2   GFR: CrCl cannot be calculated (Unknown ideal weight.). Liver Function Tests: No results for input(s):  AST, ALT, ALKPHOS, BILITOT, PROT, ALBUMIN in the last 168 hours. No results for input(s): LIPASE, AMYLASE in the last 168 hours. No results for input(s): AMMONIA in the last 168 hours. Coagulation Profile: No results for input(s): INR, PROTIME in the last 168 hours. Cardiac Enzymes: No results for input(s): CKTOTAL, CKMB, CKMBINDEX, TROPONINI in the last 168 hours. BNP (last 3 results) No results for input(s): PROBNP in the last 8760 hours. HbA1C: No results for input(s): HGBA1C in the last 72 hours. CBG: No results for input(s): GLUCAP in the last 168 hours. Lipid Profile: No results for input(s): CHOL, HDL, LDLCALC, TRIG, CHOLHDL, LDLDIRECT in the last 72 hours. Thyroid Function Tests: No results for input(s): TSH, T4TOTAL, FREET4, T3FREE, THYROIDAB in the last 72  hours. Anemia Panel: No results for input(s): VITAMINB12, FOLATE, FERRITIN, TIBC, IRON, RETICCTPCT in the last 72 hours. Urine analysis:    Component Value Date/Time   COLORURINE YELLOW 08/13/2017 0028   APPEARANCEUR CLEAR 08/13/2017 0028   LABSPEC 1.010 08/13/2017 0028   PHURINE 6.0 08/13/2017 0028   GLUCOSEU NEGATIVE 08/13/2017 0028   HGBUR NEGATIVE 08/13/2017 0028   BILIRUBINUR NEGATIVE 08/13/2017 0028   KETONESUR NEGATIVE 08/13/2017 0028   PROTEINUR NEGATIVE 08/13/2017 0028   NITRITE NEGATIVE 08/13/2017 0028   LEUKOCYTESUR NEGATIVE 08/13/2017 0028   Sepsis Labs: No results found for this or any previous visit (from the past 240 hour(s)).   Radiological Exams on Admission: Ct Maxillofacial W Contrast  Result Date: 07/29/2018 CLINICAL DATA:  Left-sided toothache. Swelling of the left side of the face presently. EXAM: CT MAXILLOFACIAL WITH CONTRAST TECHNIQUE: Multidetector CT imaging of the maxillofacial structures was performed with intravenous contrast. Multiplanar CT image reconstructions were also generated. CONTRAST:  75mL OMNIPAQUE IOHEXOL 300 MG/ML  SOLN COMPARISON:  None. FINDINGS: Osseous: Dental decay and periodontal disease of the limited remaining dentition. The acute inflammatory changes on the left probably derive from the left maxillary tooth number 11 which shows severe decay and lucency around the root with breakthrough of the lateral cortex. Orbits: No intraorbital abnormality. Pronounced periorbital preseptal soft tissue swelling on the left. Sinuses: Normal Soft tissues: Pronounced soft tissue swelling of the left side of the face. No distinct nonenhancing collection that would suggest the presence of a drainable abscess. There is phlegmonous inflammation in the left cheek and base of the nose but no fluid density area. Limited intracranial: Normal IMPRESSION: Pronounced left facial cellulitis with phlegmonous inflammation of the left cheek and base of the nose region. I  do not identify drainable low-density abscess. This probably derives from advanced dental decay and periodontal disease at the left maxillary tooth number 11. Electronically Signed   By: Paulina Fusi M.D.   On: 07/29/2018 21:09    EKG: Independently reviewed.  Sinus rhythm at 80 bpm with left anterior fascicular block  Assessment/Plan Cellulitis of the face 2/2 left periodontal disease: Acute.  Patient presents with left facial swelling.  CT imaging shows signs of left sided facial cellulitis with no drainable abscess and periodontal disease of the left maxillary tooth. - Admit to a telemetry bed - Follow-up blood cultures - Continue empiric antibiotics of vancomycin, de-escalate to oral antibiotics medically appropriate  - Toradol IV prn Pain - Consider consulted oral surgery in a.m. if available  IV drug abuse, polysubstance abuse: Patient reportedly went to the bathroom and possibly took some unknown substance prior to becoming very somnolent requiring Narcan.  UDS positive for cocaine.  HIV negative as of seven 11/2017. - Continue  Subutex in a.m. - Sitter to bedside as patient possible danger to self  - Current substance abuse  Possible abscess of the left hand - May warrant consult to hand surgery  for I&D  Hyperkalemia: Acute.  Upon admission into the emergency department patient was noted to have a potassium of 6.2.  No significant EKG changes noted.  Patient was initially given 1 L of normal saline IV fluids. -  Normal saline IV fluids at 100 mL/h - Recheck in a.m. and treat as needed  Normocytic normochromic anemia: Stable.  Hemoglobin 11.9 on admission. - Continue to monitor  Leukocytosis: WBC elevated 10.8.  Suspect secondary to cellulitis of the face.  - Check CBC in a.m.  Anxiety/PTSD  - Reduced dose of Ativan to 0.5 mg 3 times daily as needed for anxiety due to increased lethargy  Hyponatremia: Acute. Initial sodium 133. - Recheck in a.m.   DVT prophylaxis:  Lovenox Code Status: DNR per patient making note of previous admissions. Family Communication: No family present at bedside Disposition Plan: Likely discharge home once medically stable Consults called: none Admission status: Inpatient   Clydie Braunondell A Tessia Kassin MD [facility] Pager [phone]   If 7PM-7AM, please contact night-coverage [email or web address] Password [email or web address]  07/29/2018, 9:55 PM

## 2018-07-29 NOTE — ED Notes (Signed)
ED TO INPATIENT HANDOFF REPORT  Name/Age/Gender Joshua Jacobs 42 y.o. male  Code Status    Code Status Orders  (From admission, onward)         Start     Ordered   07/29/18 2249  Full code  Continuous     07/29/18 2250        Code Status History    Date Active Date Inactive Code Status Order ID Comments User Context   05/15/2018 0823 05/17/2018 1459 Full Code [id number]  Samella Parr, NP ED   08/13/2017 1938 08/19/2017 1712 DNR [id number]  Karmen Bongo, MD Inpatient      Home/SNF/Other Home  Chief Complaint Abscess  Level of Care/Admitting Diagnosis ED Disposition    ED Disposition Condition [place] [facility] Area: [facility] [[id number]]  Level of Care: Telemetry [5]  Admit to tele based on following criteria: Complex arrhythmia (Bradycardia/Tachycardia)  Diagnosis: Cellulitis of left jaw [174944]  Admitting Physician: Norval Morton [[id number]]  Attending Physician: Norval Morton [[id number]]  Estimated length of stay: past midnight tomorrow  Certification:: I certify this patient will need inpatient services for at least 2 midnights  PT Class (Do Not Modify): Inpatient [101]  PT Acc Code (Do Not Modify): Private [1]       Medical History Past Medical History:  Diagnosis Date  . Anxiety   . Chronic hip pain   . Degenerative joint disease   . Degenerative joint disease of left hip   . Hemorrhoids   . History of MSSA bacteremia 08/2017  . IV drug abuse ([place])   . Opioid abuse ([place])   . Polysubstance abuse ([place])   . PTSD (post-traumatic stress disorder)   . Sweating abnormality     Allergies Allergies  Allergen Reactions  . Penicillins Anaphylaxis    Has patient had a PCN reaction causing immediate rash, facial/tongue/throat swelling, SOB or lightheadedness with hypotension: Yes Has patient had a PCN reaction causing severe rash involving mucus membranes or skin necrosis: No Has patient had a PCN reaction that required  hospitalization Yes Has patient had a PCN reaction occurring within the last 10 years: No If all of the above answers are "NO", then may proceed with Cephalosporin  Childhood PCN > described as chest closing/tightness.   Marland Kitchen Penicillins Anaphylaxis    Has patient had a PCN reaction causing immediate rash, facial/tongue/throat swelling, SOB or lightheadedness with hypotension: No Has patient had a PCN reaction causing severe rash involving mucus membranes or skin necrosis: No Has patient had a PCN reaction that required hospitalization: Yes Has patient had a PCN reaction occurring within the last 10 years: No If all of the above answers are "NO", then may proceed with Cephalosporin use.    IV Location/Drains/Wounds Patient Lines/Drains/Airways Status   Active Line/Drains/Airways    Name:   Placement date:   Placement time:   Site:   Days:   Peripheral IV 07/29/18 Right Antecubital   07/29/18    1942    Antecubital   less than 1          Labs/Imaging Results for orders placed or performed during the hospital encounter of 07/29/18 (from the past 48 hour(s))  CBC with Differential     Status: Abnormal   Collection Time: 07/29/18  7:32 PM  Result Value Ref Range   WBC 10.8 (H) 4.0 - 10.5 K/uL   RBC 4.52 4.22 - 5.81 MIL/uL   Hemoglobin 11.9 (L) 13.0 - 17.0  g/dL   HCT 36.3 (L) 39.0 - 52.0 %   MCV 80.3 78.0 - 100.0 fL   MCH 26.3 26.0 - 34.0 pg   MCHC 32.8 30.0 - 36.0 g/dL   RDW 14.7 11.5 - 15.5 %   Platelets 243 150 - 400 K/uL   Neutrophils Relative % 66 %   Neutro Abs 7.2 1.7 - 7.7 K/uL   Lymphocytes Relative 20 %   Lymphs Abs 2.1 0.7 - 4.0 K/uL   Monocytes Relative 12 %   Monocytes Absolute 1.3 (H) 0.1 - 1.0 K/uL   Eosinophils Relative 2 %   Eosinophils Absolute 0.2 0.0 - 0.7 K/uL   Basophils Relative 0 %   Basophils Absolute 0.0 0.0 - 0.1 K/uL    Comment: Performed at [facility], [place] [address], [place], [place] [address]  Basic metabolic panel     Status:  Abnormal   Collection Time: 07/29/18  7:32 PM  Result Value Ref Range   Sodium 133 (L) 135 - 145 mmol/L   Potassium 6.2 (H) 3.5 - 5.1 mmol/L   Chloride 98 98 - 111 mmol/L   CO2 26 22 - 32 mmol/L   Glucose, Bld 100 (H) 70 - 99 mg/dL   BUN 18 6 - 20 mg/dL   Creatinine, Ser 0.97 0.61 - 1.24 mg/dL   Calcium 9.2 8.9 - 10.3 mg/dL   GFR calc non Af Amer >60 >60 mL/min   GFR calc Af Amer >60 >60 mL/min    Comment: (NOTE) The eGFR has been calculated using the CKD EPI equation. This calculation has not been validated in all clinical situations. eGFR's persistently <60 mL/min signify possible Chronic Kidney Disease.    Anion gap 9 5 - 15    Comment: Performed at [facility], [place] [address] [place], [place] [address]  I-Stat CG4 Lactic Acid, ED     Status: None   Collection Time: 07/29/18  9:19 PM  Result Value Ref Range   Lactic Acid, Venous 1.71 0.5 - 1.9 mmol/L  Urine rapid drug screen (hosp performed)     Status: Abnormal   Collection Time: 07/29/18  9:56 PM  Result Value Ref Range   Opiates NONE DETECTED NONE DETECTED   Cocaine POSITIVE (A) NONE DETECTED   Benzodiazepines NONE DETECTED NONE DETECTED   Amphetamines NONE DETECTED NONE DETECTED   Tetrahydrocannabinol NONE DETECTED NONE DETECTED   Barbiturates NONE DETECTED NONE DETECTED    Comment: (NOTE) DRUG SCREEN FOR MEDICAL PURPOSES ONLY.  IF CONFIRMATION IS NEEDED FOR ANY PURPOSE, NOTIFY LAB WITHIN 5 DAYS. LOWEST DETECTABLE LIMITS FOR URINE DRUG SCREEN Drug Class                     Cutoff (ng/mL) Amphetamine and metabolites    1000 Barbiturate and metabolites    200 Benzodiazepine                 989 Tricyclics and metabolites     300 Opiates and metabolites        300 Cocaine and metabolites        300 THC                            50 Performed at [facility], [place] [address], [place], [place] [address]    Ct Maxillofacial W Contrast  Result Date: 07/29/2018 CLINICAL DATA:   Left-sided toothache. Swelling of the left side  of the face presently. EXAM: CT MAXILLOFACIAL WITH CONTRAST TECHNIQUE: Multidetector CT imaging of the maxillofacial structures was performed with intravenous contrast. Multiplanar CT image reconstructions were also generated. CONTRAST:  27m OMNIPAQUE IOHEXOL 300 MG/ML  SOLN COMPARISON:  None. FINDINGS: Osseous: Dental decay and periodontal disease of the limited remaining dentition. The acute inflammatory changes on the left probably derive from the left maxillary tooth number 11 which shows severe decay and lucency around the root with breakthrough of the lateral cortex. Orbits: No intraorbital abnormality. Pronounced periorbital preseptal soft tissue swelling on the left. Sinuses: Normal Soft tissues: Pronounced soft tissue swelling of the left side of the face. No distinct nonenhancing collection that would suggest the presence of a drainable abscess. There is phlegmonous inflammation in the left cheek and base of the nose but no fluid density area. Limited intracranial: Normal IMPRESSION: Pronounced left facial cellulitis with phlegmonous inflammation of the left cheek and base of the nose region. I do not identify drainable low-density abscess. This probably derives from advanced dental decay and periodontal disease at the left maxillary tooth number 11. Electronically Signed   By: MNelson ChimesM.D.   On: 07/29/2018 21:09    Pending Labs Unresulted Labs (From admission, onward)    Start     Ordered   07/30/18 0500  CBC  Tomorrow morning,   R     07/29/18 2250   07/30/18 05631 Basic metabolic panel  Tomorrow morning,   R     07/29/18 2250   07/29/18 2250  Potassium  Once,   R     07/29/18 2250   07/29/18 1953  Blood culture (routine x 2)  BLOOD CULTURE X 2,   STAT     07/29/18 1952          Vitals/Pain Today's Vitals   07/29/18 2115 07/29/18 2130 07/29/18 2200 07/29/18 2230  BP: (!) 167/102 (!) 167/116 (!) 150/89 (!) 133/97  Pulse: 97 98  78 91  Resp: 13 10 (!) 9 (!) 26  Temp:      TempSrc:      SpO2: 100% 99% 98% 96%  PainSc:        Isolation Precautions No active isolations  Medications Medications  sodium chloride 0.9 % injection (has no administration in time range)  naloxone (NARCAN) injection 1 mg (has no administration in time range)  vancomycin (VANCOCIN) 2,000 mg in sodium chloride 0.9 % 500 mL IVPB (2,000 mg Intravenous New Bag/Given 07/29/18 2145)  enoxaparin (LOVENOX) injection 40 mg (has no administration in time range)  sodium chloride flush (NS) 0.9 % injection 3 mL (has no administration in time range)  0.9 %  sodium chloride infusion (has no administration in time range)  ondansetron (ZOFRAN) tablet 4 mg (has no administration in time range)    Or  ondansetron (ZOFRAN) injection 4 mg (has no administration in time range)  acetaminophen (TYLENOL) tablet 650 mg (has no administration in time range)    Or  acetaminophen (TYLENOL) suppository 650 mg (has no administration in time range)  albuterol (PROVENTIL) (2.5 MG/3ML) 0.083% nebulizer solution 2.5 mg (has no administration in time range)  ketorolac (TORADOL) 30 MG/ML injection 30 mg (has no administration in time range)  buprenorphine-naloxone (SUBOXONE) 2-0.5 mg per SL tablet 3 tablet (has no administration in time range)  LORazepam (ATIVAN) tablet 0.5 mg (has no administration in time range)  sodium chloride 0.9 % bolus 1,000 mL (1,000 mLs Intravenous New Bag/Given 07/29/18 1943)  iohexol (OMNIPAQUE) 300  MG/ML solution 75 mL (75 mLs Intravenous Contrast Given 07/29/18 2051)  LORazepam (ATIVAN) injection 1 mg (1 mg Intravenous Given 07/29/18 2035)  naloxone [facility]) 2 MG/2ML injection (1 mg  Given 07/29/18 2116)  ondansetron (ZOFRAN) 4 MG/2ML injection (4 mg  Given 07/29/18 2117)    Mobility walks

## 2018-07-29 NOTE — ED Notes (Signed)
Patient A/O X 4. Cooperative and resting quietly in bed.

## 2018-07-29 NOTE — Progress Notes (Signed)
A consult was received from an ED physician for vancomycin per pharmacy dosing.  The patient's profile has been reviewed for ht/wt/allergies/indication/available labs.   A one time order has been placed for Vancomycin 2gm iv x1.  Further antibiotics/pharmacy consults should be ordered by admitting physician if indicated.                       Thank you, Aleene DavidsonGrimsley Jr, Tegan Britain Crowford 07/29/2018  9:34 PM

## 2018-07-29 NOTE — ED Notes (Signed)
Patient  began throwing up immediately after receiving narcan. Now awake. MD at bedside.

## 2018-07-29 NOTE — ED Triage Notes (Signed)
He c/o left-sided toothache "for a while"--here today with c/o edema and erythema (significant) of entire left side of face, including orbit and periorbital area. He is in no distress.
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
## 2018-07-30 DIAGNOSIS — L02512 Cutaneous abscess of left hand: Secondary | ICD-10-CM | POA: Diagnosis present

## 2018-07-30 DIAGNOSIS — L03211 Cellulitis of face: Principal | ICD-10-CM

## 2018-07-30 DIAGNOSIS — E875 Hyperkalemia: Secondary | ICD-10-CM | POA: Diagnosis present

## 2018-07-30 LAB — BLOOD CULTURE ID PANEL (REFLEXED)
ACINETOBACTER BAUMANNII: NOT DETECTED
CANDIDA ALBICANS: NOT DETECTED
CANDIDA PARAPSILOSIS: NOT DETECTED
CANDIDA TROPICALIS: NOT DETECTED
Candida glabrata: NOT DETECTED
Candida krusei: NOT DETECTED
ENTEROBACTER CLOACAE COMPLEX: NOT DETECTED
ENTEROCOCCUS SPECIES: NOT DETECTED
ESCHERICHIA COLI: NOT DETECTED
Enterobacteriaceae species: NOT DETECTED
HAEMOPHILUS INFLUENZAE: NOT DETECTED
KLEBSIELLA OXYTOCA: NOT DETECTED
Klebsiella pneumoniae: NOT DETECTED
Listeria monocytogenes: NOT DETECTED
Neisseria meningitidis: NOT DETECTED
Proteus species: NOT DETECTED
Pseudomonas aeruginosa: NOT DETECTED
STREPTOCOCCUS SPECIES: DETECTED — AB
Serratia marcescens: NOT DETECTED
Staphylococcus aureus (BCID): NOT DETECTED
Staphylococcus species: NOT DETECTED
Streptococcus agalactiae: NOT DETECTED
Streptococcus pneumoniae: NOT DETECTED
Streptococcus pyogenes: NOT DETECTED

## 2018-07-30 LAB — BASIC METABOLIC PANEL
ANION GAP: 10 (ref 5–15)
BUN: 14 mg/dL (ref 6–20)
CHLORIDE: 103 mmol/L (ref 98–111)
CO2: 25 mmol/L (ref 22–32)
Calcium: 9.1 mg/dL (ref 8.9–10.3)
Creatinine, Ser: 1.06 mg/dL (ref 0.61–1.24)
GFR calc Af Amer: >60 mL/min (ref >60)
Glucose, Bld: 98 mg/dL (ref 70–99)
POTASSIUM: 3.9 mmol/L (ref 3.5–5.1)
Sodium: 138 mmol/L (ref 135–145)

## 2018-07-30 LAB — CBC
HCT: 34.6 % — ABNORMAL LOW (ref 39.0–52.0)
HEMOGLOBIN: 11.1 g/dL — AB (ref 13.0–17.0)
MCH: 25.6 pg — ABNORMAL LOW (ref 26.0–34.0)
MCHC: 32.1 g/dL (ref 30.0–36.0)
MCV: 79.9 fL (ref 78.0–100.0)
PLATELETS: 306 10*3/uL (ref 150–400)
RBC: 4.33 MIL/uL (ref 4.22–5.81)
RDW: 14.6 % (ref 11.5–15.5)
WBC: 11.6 10*3/uL — AB (ref 4.0–10.5)

## 2018-07-30 MED ORDER — VANCOMYCIN HCL IN DEXTROSE 1-5 GM/200ML-% IV SOLN: 1000.0000 mg | Freq: Two times a day (BID) | INTRAVENOUS | Status: DC

## 2018-07-30 NOTE — Progress Notes (Signed)
Safety sitter called nurse into room as pt had locked himself in the bathroom and shower is running.  Called to pt not to take shower yet until doctor rounded, pt stated he was just washing up in the sink,  Pt finally opened door and smoke came out of the bathroom. Safety sitter confronted pt about smoking in the bathroom as well as the safety implications with oxygen.  Pt became verbally abusive to sitter and agitated, denying all accusations.  Security arrived and pt left AMA, despite being counseled on the dangers of not getting treatment for his infection. Dr. Hanley BenAlekh notified.

## 2018-07-30 NOTE — Progress Notes (Signed)
In the middle of completing the patients admission questions, he stated "I don't feel like answering any more questions right now, leave me alone." Educated patient that the reason we are asking is to better care for him. Will attempt at a later time.

## 2018-07-30 NOTE — Progress Notes (Signed)
Patient ID: Joshua Jacobs, male   DOB: 05/15/1976, [age] y.o.   MRN: [id number] I was called by microbiology lab regarding positive blood cultures.  Patient has gram-positive cocci in chains in both aerobic and anaerobic bottles and BC ID is suggestive of Streptococcus species.  Patient had already signed out AGAINST MEDICAL ADVICE in the morning of 07/30/2018.  I have tried calling the patient at the number listed [phone] with states that the number is not in service.  I have also tried to reach out to his father Kathaleen BuryFloyd Meadow at the #[phone] with no answer.

## 2018-07-30 NOTE — Progress Notes (Signed)
Pharmacy Antibiotic Note  Joshua Jacobs is a 42 y.o. male admitted on 07/29/2018 with cellulitis.  Pharmacy has been consulted for Vancomycin dosing.  Plan: Vancomycin 2gm iv x1, then 1gm iv q12hr  Goal AUC = 400 - 500 for all indications, except meningitis (goal AUC > 500 and Cmin 15-20 mcg/mL)   Height: 5\' 6"  (167.6 cm) Weight: 270 lb (122.5 kg) IBW/kg (Calculated) : 63.8  Temp (24hrs), Avg:98.8 F (37.1 C), Min:98.8 F (37.1 C), Max:98.8 F (37.1 C)  Recent Labs  Lab 07/29/18 1932 07/29/18 2119 07/30/18 0137  WBC 10.8*  --  11.6*  CREATININE 0.97  --   --   LATICACIDVEN  --  1.71  --     Estimated Creatinine Clearance: 126.3 mL/min (by C-G formula based on SCr of 0.97 mg/dL).    Allergies  Allergen Reactions  . Penicillins Anaphylaxis    Has patient had a PCN reaction causing immediate rash, facial/tongue/throat swelling, SOB or lightheadedness with hypotension: Yes Has patient had a PCN reaction causing severe rash involving mucus membranes or skin necrosis: No Has patient had a PCN reaction that required hospitalization Yes Has patient had a PCN reaction occurring within the last 10 years: No If all of the above answers are "NO", then may proceed with Cephalosporin  Childhood PCN > described as chest closing/tightness.   Marland Kitchen. Penicillins Anaphylaxis    Has patient had a PCN reaction causing immediate rash, facial/tongue/throat swelling, SOB or lightheadedness with hypotension: No Has patient had a PCN reaction causing severe rash involving mucus membranes or skin necrosis: No Has patient had a PCN reaction that required hospitalization: Yes Has patient had a PCN reaction occurring within the last 10 years: No If all of the above answers are "NO", then may proceed with Cephalosporin use.    Antimicrobials this admission: Vancomycin 07/29/2018 >>  Dose adjustments this admission: -  Microbiology results: -  Thank you for allowing pharmacy to be a part of this  patient's care.  Aleene DavidsonGrimsley Jr, Kc Sedlak Crowford 07/30/2018 2:20 AM

## 2018-07-30 NOTE — Discharge Summary (Signed)
Physician Discharge Summary  Janetta HoraSteven Slinger ZHY:[id number] DOB: 08/02/1976 DOA: 07/29/2018  PCP: Patient, No Pcp Per  Admit date: 07/29/2018 Discharge date: 07/30/2018  Admitted From: Home Disposition: Left against medical advice   Brief/Interim Summary:  42 y.o. male with medical history significant of IV drug abuse(heroin and cocaine), anxiety, PTSD, MSSA bacteremia, chronic pain 2/2 degenerative joint disease presented with tooth pain and facial swelling.  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  He was started on IV antibiotics.  He signed out AGAINST MEDICAL ADVICE.  Discharge Diagnoses:  Principal Problem:   Cellulitis of left jaw Active Problems:   Anxiety   Anemia   Polysubstance dependence (HCC)   IV drug user   Abscess of left hand   Hyperkalemia  Left facial cellulitis with periorbital disease IV drug abuse/poor substance abuse Possible abscess of the left hand Hyperkalemia Normocytic normochromic anemia Hyponatremia Anxiety/PTSD  Hospital course Patient was started on IV antibiotics for left-sided facial cellulitis with no drainable abscess collection.  Patient signed out AGAINST MEDICAL ADVICE despite being told the risks of signing out AGAINST MEDICAL ADVICE including worsening of his general medical condition and death.   Discharge Instructions     Allergies  Allergen Reactions  . Penicillins Anaphylaxis    Has patient had a PCN reaction causing immediate rash, facial/tongue/throat swelling, SOB or lightheadedness with hypotension: Yes Has patient had a PCN reaction causing severe rash involving mucus membranes or skin necrosis: No Has patient had a PCN reaction that required hospitalization Yes Has patient had a PCN reaction occurring within the last 10 years: No If all of the above answers are "NO", then may proceed with Cephalosporin  Childhood PCN > described as  chest closing/tightness.   Marland Kitchen. Penicillins Anaphylaxis    Has patient had a PCN reaction causing immediate rash, facial/tongue/throat swelling, SOB or lightheadedness with hypotension: No Has patient had a PCN reaction causing severe rash involving mucus membranes or skin necrosis: No Has patient had a PCN reaction that required hospitalization: Yes Has patient had a PCN reaction occurring within the last 10 years: No If all of the above answers are "NO", then may proceed with Cephalosporin use.    Consultations:  None   Procedures/Studies: Dg Chest 2 View  Result Date: 07/06/2018 CLINICAL DATA:  Mid chest pain. EXAM: CHEST - 2 VIEW COMPARISON:  Radiograph 04/14/2018, CT 08/10/2017 FINDINGS: The cardiomediastinal contours are normal for AP technique. The lungs are clear. Pulmonary vasculature is normal. No consolidation, pleural effusion, or pneumothorax. No acute osseous abnormalities are seen. IMPRESSION: No acute pulmonary process. Electronically Signed   By: Rubye OaksMelanie  Ehinger M.D.   On: 07/06/2018 03:11   Ct Maxillofacial W Contrast  Result Date: 07/29/2018 CLINICAL DATA:  Left-sided toothache. Swelling of the left side of the face presently. EXAM: CT MAXILLOFACIAL WITH CONTRAST TECHNIQUE: Multidetector CT imaging of the maxillofacial structures was performed with intravenous contrast. Multiplanar CT image reconstructions were also generated. CONTRAST:  75mL OMNIPAQUE IOHEXOL 300 MG/ML  SOLN COMPARISON:  None. FINDINGS: Osseous: Dental decay and periodontal disease of the limited remaining dentition. The acute inflammatory changes on the left probably derive from the left maxillary tooth number 11 which shows severe decay and lucency around the root with breakthrough of the lateral cortex. Orbits: No intraorbital abnormality. Pronounced periorbital preseptal soft tissue swelling on the left. Sinuses: Normal Soft tissues: Pronounced soft tissue swelling of the left side of the face. No distinct  nonenhancing collection that would suggest the presence of a drainable abscess. There is phlegmonous inflammation in the left cheek and base of the nose but no fluid density area. Limited intracranial: Normal IMPRESSION: Pronounced left facial cellulitis with phlegmonous inflammation of the left cheek and base of the nose region. I do not identify drainable low-density abscess. This probably derives from advanced dental decay and periodontal disease at the left maxillary tooth number 11. Electronically Signed   By: Paulina Fusi M.D.   On: 07/29/2018 21:09      The results of significant diagnostics from this hospitalization (including imaging, microbiology, ancillary and laboratory) are listed below for reference.     Labs: BNP (last 3 results) No results for input(s): BNP in the last 8760 hours. Basic Metabolic Panel: Recent Labs  Lab 07/29/18 1932 07/30/18 0137  NA 133* 138  K 6.2* 3.9  CL 98 103  CO2 26 25  GLUCOSE 100* 98  BUN 18 14  CREATININE 0.97 1.06  CALCIUM 9.2 9.1   Liver Function Tests: No results for input(s): AST, ALT, ALKPHOS, BILITOT, PROT, ALBUMIN in the last 168 hours. No results for input(s): LIPASE, AMYLASE in the last 168 hours. No results for input(s): AMMONIA in the last 168 hours. CBC: Recent Labs  Lab 07/29/18 1932 07/30/18 0137  WBC 10.8* 11.6*  NEUTROABS 7.2  --   HGB 11.9* 11.1*  HCT 36.3* 34.6*  MCV 80.3 79.9  PLT 243 306   Cardiac Enzymes: No results for input(s): CKTOTAL, CKMB, CKMBINDEX, TROPONINI in the last 168 hours. BNP: Invalid input(s): POCBNP CBG: No results for input(s): GLUCAP in the last 168 hours. D-Dimer No results for input(s): DDIMER in the last 72 hours. Hgb A1c No results for input(s): HGBA1C in the last 72 hours. Lipid Profile No results for input(s): CHOL, HDL, LDLCALC, TRIG, CHOLHDL, LDLDIRECT in the last 72 hours. Thyroid function studies No results for input(s): TSH, T4TOTAL, T3FREE, THYROIDAB in the last 72  hours.  Invalid input(s): FREET3 Anemia work up No results for input(s): VITAMINB12, FOLATE, FERRITIN, TIBC, IRON, RETICCTPCT in the last 72 hours. Urinalysis    Component Value Date/Time   COLORURINE YELLOW 08/13/2017 0028   APPEARANCEUR CLEAR 08/13/2017 0028   LABSPEC 1.010 08/13/2017 0028   PHURINE 6.0 08/13/2017 0028   GLUCOSEU NEGATIVE 08/13/2017 0028   HGBUR NEGATIVE 08/13/2017 0028   BILIRUBINUR NEGATIVE 08/13/2017 0028   KETONESUR NEGATIVE 08/13/2017 0028   PROTEINUR NEGATIVE 08/13/2017 0028   NITRITE NEGATIVE 08/13/2017 0028   LEUKOCYTESUR NEGATIVE 08/13/2017 0028    SIGNED:   Glade Lloyd, MD  [facility] 07/30/2018, 4:23 PM Pager: [phone]  If 7PM-7AM, please contact night-coverage [email or web address] Password [email or web address]

## 2018-07-31 NOTE — ED Notes (Signed)
Ward ChattersMatt [facility]alkins [facility] called regarding patient and results of blood cultures to aid in continuity of care.

## 2018-08-04 LAB — CULTURE, BLOOD (ROUTINE X 2): Special Requests: ADEQUATE

## 2018-09-28 MED FILL — BUPRENORPHINE HCL-NALOXONE: 8-2 | 8 days supply | Qty: 24 | Fill #0

## 2019-04-11 ENCOUNTER — Emergency Department (HOSPITAL_COMMUNITY)
Admission: EM | Admit: 2019-04-11 | Discharge: 2019-04-11 | Disposition: A | Discharge status: Home / Self Care | Payer: Self-pay | Attending: Emergency Medicine | Admitting: Emergency Medicine

## 2019-04-11 ENCOUNTER — Encounter (HOSPITAL_COMMUNITY): Payer: Self-pay

## 2019-04-11 ENCOUNTER — Other Ambulatory Visit: Payer: Self-pay

## 2019-04-11 DIAGNOSIS — Z79899 Other long term (current) drug therapy: Secondary | ICD-10-CM | POA: Insufficient documentation

## 2019-04-11 DIAGNOSIS — F1110 Opioid abuse, uncomplicated: Secondary | ICD-10-CM | POA: Insufficient documentation

## 2019-04-11 MED ORDER — DICYCLOMINE HCL 20 MG PO TABS: 20.0000 mg | ORAL_TABLET | Freq: Two times a day (BID) | ORAL | 0 refills | Status: DC

## 2019-04-11 MED ORDER — ONDANSETRON 4 MG PO TBDP: 4.0000 mg | ORAL_TABLET | Freq: Three times a day (TID) | ORAL | 0 refills | Status: DC | PRN

## 2019-04-11 NOTE — ED Triage Notes (Signed)
Pt was in [facility] for three days and they discharged him because he didn't have enough gabapentin to complete his stay, they sent him to [facility] and he said he sat there for 5 hours and was told there wasn't a bed, so he came here Pt admits to doing heroin from there to here
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
## 2019-04-11 NOTE — Discharge Instructions (Signed)
Take the prescribed medication as directed--these can help with nausea and upset stomach.  You can also take over-the-counter Tylenol or Motrin. Follow-up with one of the detox centers, either inpatient or outpatient. Return to the ED for new or worsening symptoms.

## 2019-06-13 ENCOUNTER — Telehealth: Payer: Self-pay | Admitting: Physician Assistant

## 2019-06-13 NOTE — Telephone Encounter (Signed)
Received notification from [facility] that patient needs a hospital follow-up with PCP Currently listed as no PCP, but he did establish care with me in October 2018 Please contact patient to schedule hospital follow-up (40 mins). Virtual appointment is okay

## 2019-06-13 NOTE — Telephone Encounter (Signed)
Left a voice mail to set up a virtual hospital f/u and I set you as his PCP

## 2019-07-29 ENCOUNTER — Emergency Department (HOSPITAL_BASED_OUTPATIENT_CLINIC_OR_DEPARTMENT_OTHER): Payer: Self-pay

## 2019-07-29 ENCOUNTER — Encounter (HOSPITAL_BASED_OUTPATIENT_CLINIC_OR_DEPARTMENT_OTHER): Payer: Self-pay | Admitting: Emergency Medicine

## 2019-07-29 ENCOUNTER — Other Ambulatory Visit: Payer: Self-pay

## 2019-07-29 ENCOUNTER — Inpatient Hospital Stay (HOSPITAL_BASED_OUTPATIENT_CLINIC_OR_DEPARTMENT_OTHER)
Admission: EM | Admit: 2019-07-29 | Discharge: 2019-07-30 | DRG: 872 | Discharge status: Against Medical Advice | Payer: Self-pay | Attending: Internal Medicine | Admitting: Internal Medicine

## 2019-07-29 DIAGNOSIS — A419 Sepsis, unspecified organism: Principal | ICD-10-CM | POA: Diagnosis present

## 2019-07-29 DIAGNOSIS — F1910 Other psychoactive substance abuse, uncomplicated: Secondary | ICD-10-CM | POA: Diagnosis present

## 2019-07-29 DIAGNOSIS — Z880 Allergy status to penicillin: Secondary | ICD-10-CM

## 2019-07-29 DIAGNOSIS — M009 Pyogenic arthritis, unspecified: Secondary | ICD-10-CM | POA: Diagnosis present

## 2019-07-29 DIAGNOSIS — F419 Anxiety disorder, unspecified: Secondary | ICD-10-CM | POA: Diagnosis present

## 2019-07-29 DIAGNOSIS — F17210 Nicotine dependence, cigarettes, uncomplicated: Secondary | ICD-10-CM | POA: Diagnosis present

## 2019-07-29 DIAGNOSIS — Z20828 Contact with and (suspected) exposure to other viral communicable diseases: Secondary | ICD-10-CM | POA: Diagnosis present

## 2019-07-29 LAB — CBC WITH DIFFERENTIAL/PLATELET
Abs Immature Granulocytes: 0.24 10*3/uL — ABNORMAL HIGH (ref 0.00–0.07)
Basophils Absolute: 0 10*3/uL (ref 0.0–0.1)
Basophils Relative: 1 %
Eosinophils Absolute: 0.1 10*3/uL (ref 0.0–0.5)
Eosinophils Relative: 2 %
HCT: 34.8 % — ABNORMAL LOW (ref 39.0–52.0)
Hemoglobin: 10.9 g/dL — ABNORMAL LOW (ref 13.0–17.0)
Immature Granulocytes: 4 %
Lymphocytes Relative: 18 %
Lymphs Abs: 1 10*3/uL (ref 0.7–4.0)
MCH: 25.3 pg — ABNORMAL LOW (ref 26.0–34.0)
MCHC: 31.3 g/dL (ref 30.0–36.0)
MCV: 80.9 fL (ref 80.0–100.0)
Monocytes Absolute: 0 10*3/uL — ABNORMAL LOW (ref 0.1–1.0)
Monocytes Relative: 1 %
Neutro Abs: 4.1 10*3/uL (ref 1.7–7.7)
Neutrophils Relative %: 74 %
Platelets: 181 10*3/uL (ref 150–400)
RBC: 4.3 MIL/uL (ref 4.22–5.81)
RDW: 17.4 % — ABNORMAL HIGH (ref 11.5–15.5)
WBC: 5.4 10*3/uL (ref 4.0–10.5)
nRBC: 0.4 % — ABNORMAL HIGH (ref 0.0–0.2)

## 2019-07-29 LAB — TROPONIN I (HIGH SENSITIVITY)
Troponin I (High Sensitivity): 8 ng/L (ref <18)
Troponin I (High Sensitivity): 51 ng/L — ABNORMAL HIGH (ref <18)

## 2019-07-29 LAB — COMPREHENSIVE METABOLIC PANEL
ALT: 18 U/L (ref 0–44)
AST: 22 U/L (ref 15–41)
Albumin: 3.7 g/dL (ref 3.5–5.0)
Alkaline Phosphatase: 111 U/L (ref 38–126)
Anion gap: 13 (ref 5–15)
BUN: 11 mg/dL (ref 6–20)
CO2: 21 mmol/L — ABNORMAL LOW (ref 22–32)
Calcium: 9.1 mg/dL (ref 8.9–10.3)
Chloride: 102 mmol/L (ref 98–111)
Creatinine, Ser: 1.15 mg/dL (ref 0.61–1.24)
GFR calc Af Amer: >60 mL/min (ref >60)
GFR calc non Af Amer: >60 mL/min (ref >60)
Glucose, Bld: 94 mg/dL (ref 70–99)
Potassium: 3.9 mmol/L (ref 3.5–5.1)
Sodium: 136 mmol/L (ref 135–145)
Total Bilirubin: 0.8 mg/dL (ref 0.3–1.2)
Total Protein: 7.7 g/dL (ref 6.5–8.1)

## 2019-07-29 LAB — ETHANOL: Alcohol, Ethyl (B): <10 mg/dL (ref <10)

## 2019-07-29 LAB — C-REACTIVE PROTEIN: CRP: 6.7 mg/dL — ABNORMAL HIGH (ref <1.0)

## 2019-07-29 LAB — SEDIMENTATION RATE: Sed Rate: 30 mm/hr — ABNORMAL HIGH (ref 0–16)

## 2019-07-29 LAB — LACTIC ACID, PLASMA
Lactic Acid, Venous: 3.5 mmol/L (ref 0.5–1.9)
Lactic Acid, Venous: 1.4 mmol/L (ref 0.5–1.9)

## 2019-07-29 LAB — SARS CORONAVIRUS 2 BY RT PCR (HOSPITAL ORDER, PERFORMED IN ~~LOC~~ HOSPITAL LAB): SARS Coronavirus 2: NEGATIVE

## 2019-07-29 MED ORDER — SODIUM CHLORIDE 0.9 % IV BOLUS
1000.0000 mL | Freq: Once | INTRAVENOUS | Status: AC
  Administered 2019-07-29: 1000 mL via INTRAVENOUS

## 2019-07-29 MED ORDER — ACETAMINOPHEN 500 MG PO TABS
1000.0000 mg | ORAL_TABLET | Freq: Once | ORAL | Status: AC
  Administered 2019-07-29: 22:00:00 1000 mg via ORAL
  Filled 2019-07-29: qty 2

## 2019-07-29 MED ORDER — LORAZEPAM 2 MG/ML IJ SOLN
1.0000 mg | Freq: Once | INTRAMUSCULAR | Status: AC
  Administered 2019-07-29: 20:00:00 1 mg via INTRAVENOUS
  Filled 2019-07-29: qty 1

## 2019-07-29 MED ORDER — MORPHINE SULFATE (PF) 4 MG/ML IV SOLN
4.0000 mg | Freq: Once | INTRAVENOUS | Status: AC
  Administered 2019-07-29: 4 mg via INTRAVENOUS
  Filled 2019-07-29: qty 1

## 2019-07-29 MED ORDER — SODIUM CHLORIDE 0.9 % IV SOLN
INTRAVENOUS | Status: DC | PRN
  Administered 2019-07-29 (×2): 250 mL via INTRAVENOUS

## 2019-07-29 MED ORDER — SODIUM CHLORIDE 0.9 % IV SOLN
2.0000 g | Freq: Once | INTRAVENOUS | Status: AC
  Administered 2019-07-29: 2 g via INTRAVENOUS
  Filled 2019-07-29: qty 2

## 2019-07-29 MED ORDER — IOHEXOL 300 MG/ML  SOLN
100.0000 mL | Freq: Once | INTRAMUSCULAR | Status: AC | PRN
  Administered 2019-07-29: 22:00:00 100 mL via INTRAVENOUS

## 2019-07-29 MED ORDER — VANCOMYCIN HCL 10 G IV SOLR
2000.0000 mg | Freq: Once | INTRAVENOUS | Status: DC
  Filled 2019-07-29: qty 2000

## 2019-07-29 MED ORDER — VANCOMYCIN HCL IN DEXTROSE 1-5 GM/200ML-% IV SOLN: 1000.0000 mg | Freq: Once | INTRAVENOUS | Status: DC

## 2019-07-29 MED ORDER — SODIUM CHLORIDE 0.9 % IV SOLN
2.0000 g | Freq: Three times a day (TID) | INTRAVENOUS | Status: DC
  Filled 2019-07-29: qty 2

## 2019-07-29 MED ORDER — VANCOMYCIN HCL IN DEXTROSE 750-5 MG/150ML-% IV SOLN
750.0000 mg | Freq: Three times a day (TID) | INTRAVENOUS | Status: DC
  Filled 2019-07-29: qty 150

## 2019-07-29 MED ORDER — AZTREONAM 1 G IJ SOLR
INTRAMUSCULAR | Status: AC
  Filled 2019-07-29: qty 2

## 2019-07-29 MED ORDER — METRONIDAZOLE IN NACL 5-0.79 MG/ML-% IV SOLN
500.0000 mg | Freq: Once | INTRAVENOUS | Status: AC
  Administered 2019-07-30: 500 mg via INTRAVENOUS
  Filled 2019-07-29: qty 100
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
## 2019-07-29 NOTE — ED Notes (Signed)
Pt difficult stick for IV. Unable to obtain 2nd set of blood cultures. EDP Long informed

## 2019-07-29 NOTE — Progress Notes (Signed)
Pharmacy Antibiotic Note  Joshua Jacobs is a 43 y.o. male admitted on 07/29/2019 with sepsis.  Pharmacy has been consulted for aztreonam and vancomycin dosing.  Presenting from home with SOB, generalized body aches, fever, and shivering. Has hx of IV drug use with hx of MSSA bacteremia and L4-L5 OM w/ septic R facet joint/epidural abscess (on linezolid; anticipated end date was 08/30/19 per notes). WBC 5.4, LA 3.5, temp 102.4. Scr 1.15 (CrCl >100 mL/min).   Has documented hx of PCN w/ anaphylaxis labeled - no hx of cephalosporin or PCN use in chart.    Plan: Vancomycin 2 g IV once then 750 mg IV every 8 hours (estAUC 518) Aztreonam 2 g IV every 8 hours Monitor renal fx, cx results, clinical pic, and vanc levels as appropriate  Height: 5\' 6"  (167.6 cm) Weight: 280 lb (127 kg) IBW/kg (Calculated) : 63.8  Temp (24hrs), Avg:102.4 F (39.1 C), Min:102.4 F (39.1 C), Max:102.4 F (39.1 C)  Recent Labs  Lab 07/29/19 2018  WBC 5.4  CREATININE 1.15  LATICACIDVEN 3.5*    Estimated Creatinine Clearance: 107.6 mL/min (by C-G formula based on SCr of 1.15 mg/dL).    Allergies  Allergen Reactions  . Penicillins Anaphylaxis    Has patient had a PCN reaction causing immediate rash, facial/tongue/throat swelling, SOB or lightheadedness with hypotension: Yes Has patient had a PCN reaction causing severe rash involving mucus membranes or skin necrosis: No Has patient had a PCN reaction that required hospitalization Yes Has patient had a PCN reaction occurring within the last 10 years: No If all of the above answers are "NO", then may proceed with Cephalosporin  Childhood PCN > described as chest closing/tightness.   Marland Kitchen Penicillins Anaphylaxis    Has patient had a PCN reaction causing immediate rash, facial/tongue/throat swelling, SOB or lightheadedness with hypotension: No Has patient had a PCN reaction causing severe rash involving mucus membranes or skin necrosis: No Has patient had a PCN  reaction that required hospitalization: Yes Has patient had a PCN reaction occurring within the last 10 years: No If all of the above answers are "NO", then may proceed with Cephalosporin use.    Antimicrobials this admission: Vancomycin 9/13 >>  Aztreonam 9/13 >>  Metronidazole 9/13>>  Dose adjustments this admission: N/A  Microbiology results: 9/13 BCx: sent 9/13 COVID: sent  Thank you for allowing pharmacy to be a part of this patient's care.  Antonietta Jewel, PharmD, BCCCP Clinical Pharmacist  Phone: [phone]  Please check AMION for all [facility] phone numbers After 10:00 PM, call Jet [phone] 07/29/2019 8:53 PM

## 2019-07-29 NOTE — ED Triage Notes (Addendum)
Pt arrives via EMS from home with c/o shortness of breath, generalized body aches, fever 101.1 at home, shivering x20 minutes. Reports he thinks his anxiety may be contributing to s/s - states he's out of his anxiety meds x1 day.  Known infection in spine x1-2 months. Hx IV drug use. Very anxious in triage.

## 2019-07-29 NOTE — ED Notes (Signed)
Lab reports a lactic of 3.5. Dr. Laverta Baltimore aware.

## 2019-07-29 NOTE — ED Notes (Signed)
Patient transported to CT 

## 2019-07-29 NOTE — ED Notes (Signed)
Contacted [facility] @ [facility] would not be available until morning.  Per Dr. Hayden Pedro request, [facility] was notified [place]) to page hospitalist consult @ [facility]

## 2019-07-29 NOTE — ED Notes (Signed)
ED Provider at bedside. 

## 2019-07-29 NOTE — ED Notes (Signed)
Requesting pain medication and anxiety medication. None ordered at this time, primary RN made aware. States, "I'd rather be miserable at home then miserable here, I'm about to start freaking out."

## 2019-07-30 MED ORDER — VANCOMYCIN HCL 1000 MG IV SOLR
INTRAVENOUS | Status: AC
  Filled 2019-07-30: qty 2000

## 2019-07-30 NOTE — ED Notes (Signed)
Pt refusing to stay on cardiac monitor at this time. Pt will allow BP cuff.

## 2019-07-30 NOTE — ED Provider Notes (Signed)
12:58 AM  I was informed by nursing that patient wishes to leave.  He does not wish to be admitted to the hospital.  I went to discuss this with him.  I have reviewed his chart.  History of discitis and primarily followed at [facility].  Per Dr. Hayden Pedro note, he contacted [facility] transfer center but they have no beds available.  Patient to be admitted to [facility].  Patient does not wish to be admitted to [facility].  I discussed with him that this could be temporary and that if he warranted transfer when beds became available, this was a possibility.  Patient is adamant that he wants to leave.  He understands that he can get worse, become more sick, or even die without appropriate treatment.  He is able to verbalize these risks to me and states understanding.  He will sign out [place].  I have encouraged him to follow-up very closely with his infectious disease specialist and neurosurgeon.  Additionally, he was encouraged to return immediately for any new or worsening symptoms.   Merryl Hacker, MD 07/30/19 0100

## 2019-07-30 NOTE — ED Notes (Addendum)
Pt flagyl complete at this time- RN started to hang Vanc and pt refusing to wait. Pt also refusing vital signs prior to departure. Pt provided with socks and gown and escorted to front. Father to pick pt up. Pt voiced understanding that he is leaving AMA and needs to follow up with his doctors.

## 2019-07-30 NOTE — ED Notes (Signed)
Pt refusing admission to [facility] at this time- pt requesting [facility] facility. Per Network engineer- [facility] line reported no available beds. Pt informed of bed status at [facility] and the importance of admission. Pt continues to refuse admission. EDP to bedside to discuss pt options.

## 2019-07-30 NOTE — ED Notes (Signed)
Pt deciding to leave AMA- pt agreeable to finish IV abx prior to departure.

## 2019-08-04 LAB — CULTURE, BLOOD (ROUTINE X 2)
Culture: NO GROWTH
Special Requests: ADEQUATE

## 2020-02-18 IMAGING — CR DG CHEST 2V
2 series · 2 of 2 positions shown · non-contrast
Comparison: Radiograph 04/14/2018, CT 08/10/2017

CLINICAL DATA: Mid chest pain.

EXAM:
CHEST - 2 VIEW

[w chest lat]
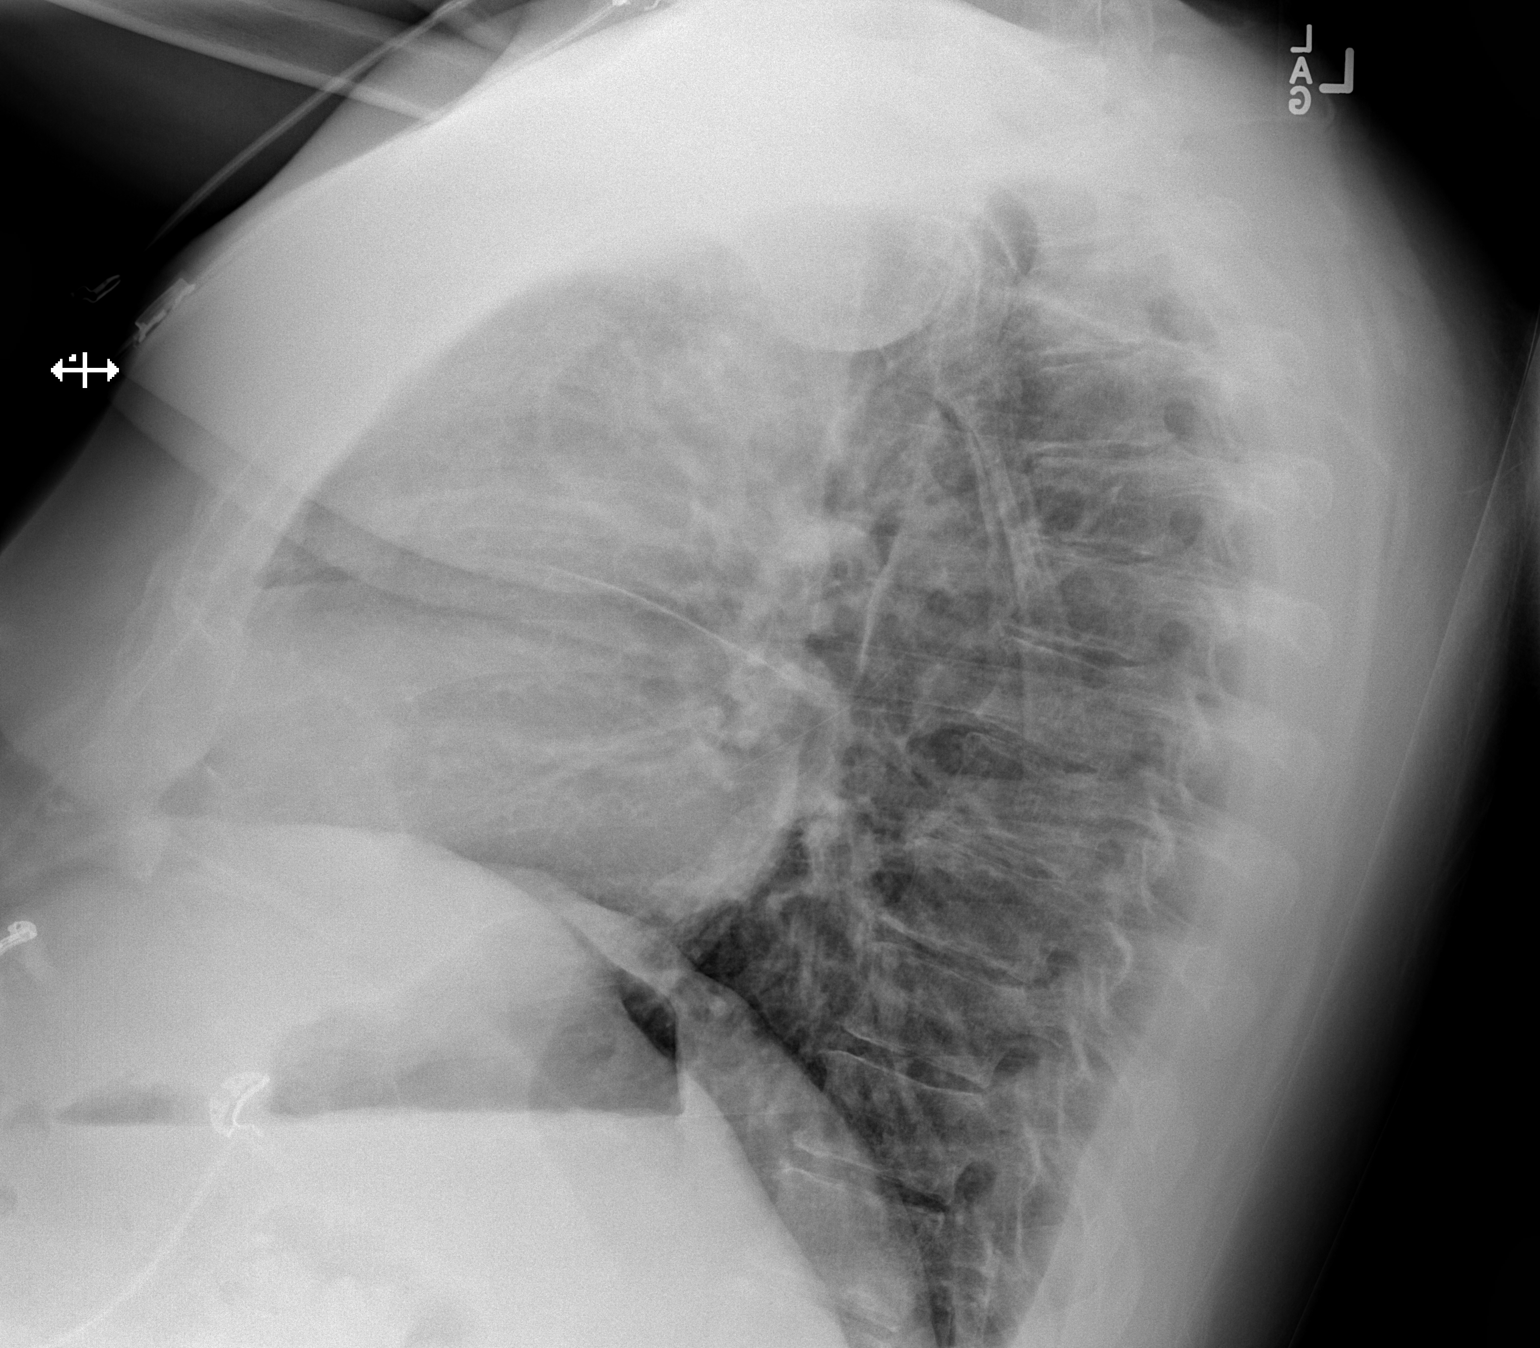

[x chest ap]
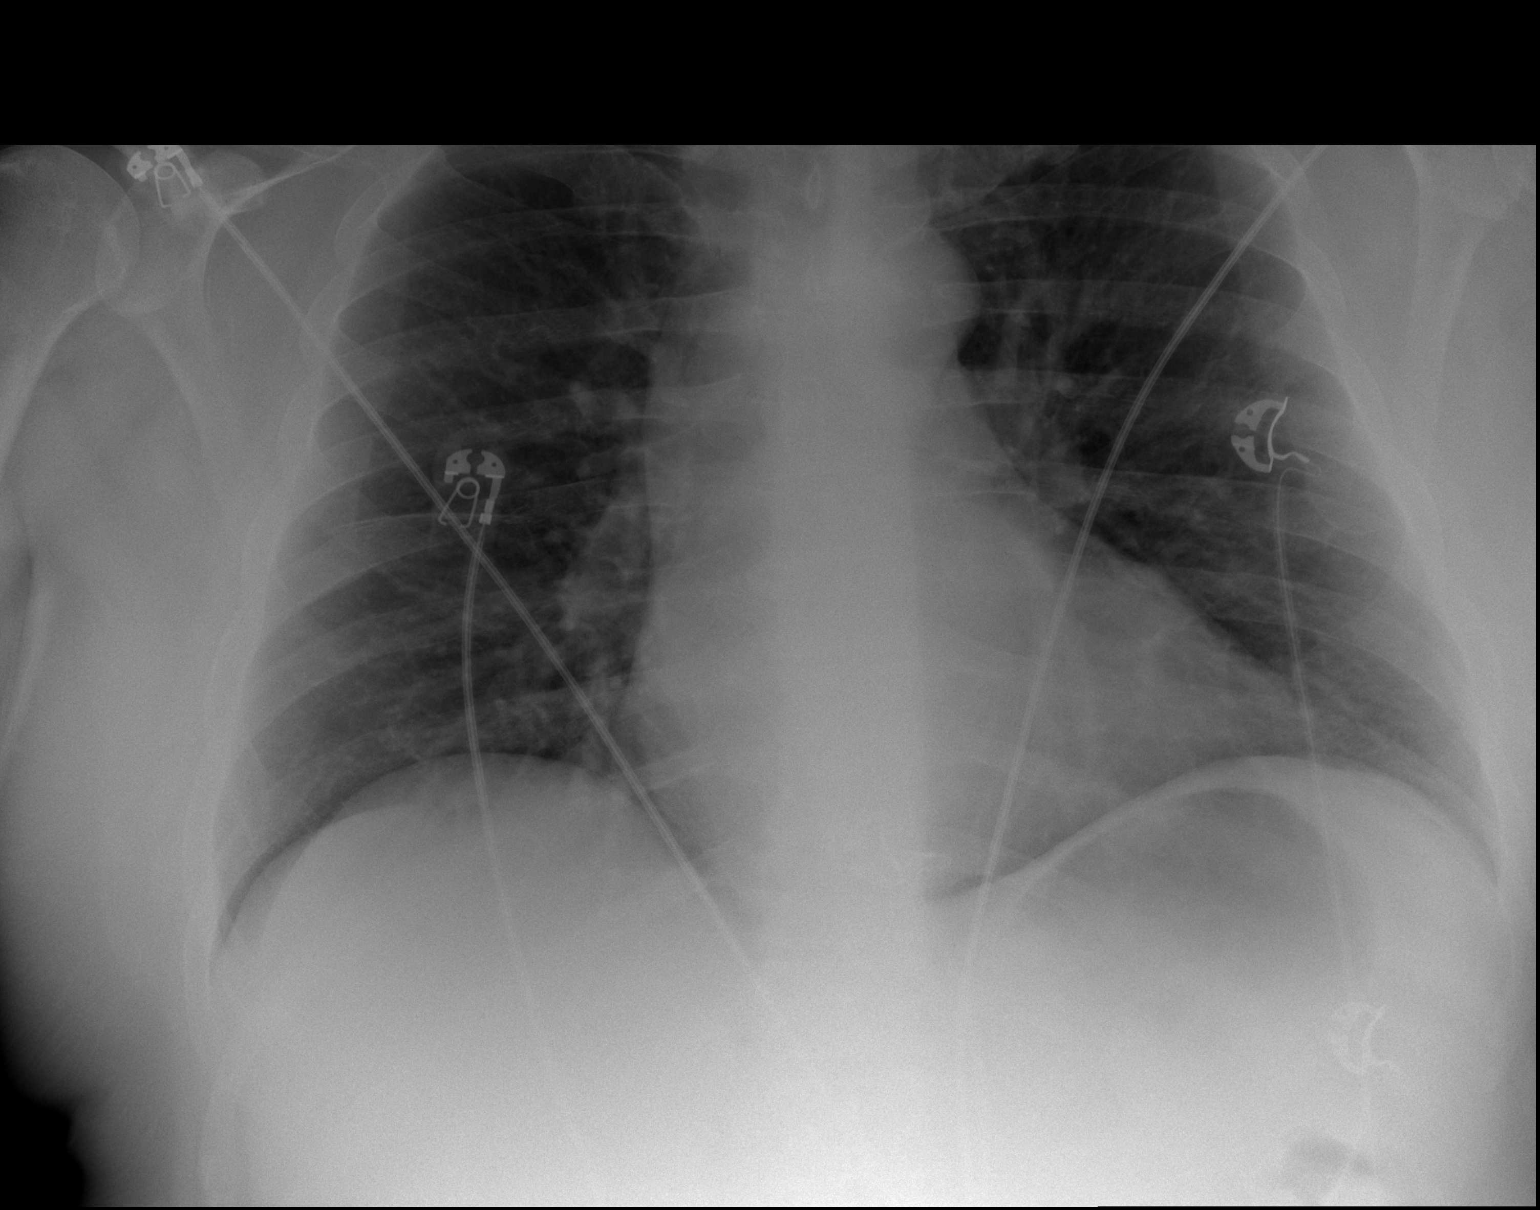

[2 of 2 positions shown; findings below may reference images not displayed]

FINDINGS: The cardiomediastinal contours are normal for AP technique. The
lungs are clear. Pulmonary vasculature is normal. No consolidation,
pleural effusion, or pneumothorax. No acute osseous abnormalities
are seen.
IMPRESSION: No acute pulmonary process.

## 2020-07-24 ENCOUNTER — Emergency Department (HOSPITAL_BASED_OUTPATIENT_CLINIC_OR_DEPARTMENT_OTHER): Payer: Self-pay

## 2020-07-24 ENCOUNTER — Encounter (HOSPITAL_BASED_OUTPATIENT_CLINIC_OR_DEPARTMENT_OTHER): Payer: Self-pay | Admitting: Emergency Medicine

## 2020-07-24 ENCOUNTER — Other Ambulatory Visit: Payer: Self-pay

## 2020-07-24 ENCOUNTER — Inpatient Hospital Stay (HOSPITAL_BASED_OUTPATIENT_CLINIC_OR_DEPARTMENT_OTHER)
Admission: EM | Admit: 2020-07-24 | Discharge: 2020-08-02 | DRG: 872 | Disposition: A | Discharge status: Home / Self Care | Payer: Self-pay | Attending: Internal Medicine | Admitting: Internal Medicine

## 2020-07-24 DIAGNOSIS — M25552 Pain in left hip: Secondary | ICD-10-CM | POA: Diagnosis present

## 2020-07-24 DIAGNOSIS — R7881 Bacteremia: Secondary | ICD-10-CM | POA: Diagnosis present

## 2020-07-24 DIAGNOSIS — Z880 Allergy status to penicillin: Secondary | ICD-10-CM

## 2020-07-24 DIAGNOSIS — L02211 Cutaneous abscess of abdominal wall: Secondary | ICD-10-CM | POA: Diagnosis present

## 2020-07-24 DIAGNOSIS — I10 Essential (primary) hypertension: Secondary | ICD-10-CM | POA: Diagnosis present

## 2020-07-24 DIAGNOSIS — Z791 Long term (current) use of non-steroidal anti-inflammatories (NSAID): Secondary | ICD-10-CM

## 2020-07-24 DIAGNOSIS — A4101 Sepsis due to Methicillin susceptible Staphylococcus aureus: Principal | ICD-10-CM | POA: Diagnosis present

## 2020-07-24 DIAGNOSIS — I808 Phlebitis and thrombophlebitis of other sites: Secondary | ICD-10-CM | POA: Diagnosis not present

## 2020-07-24 DIAGNOSIS — Z6841 Body Mass Index (BMI) 40.0 and over, adult: Secondary | ICD-10-CM

## 2020-07-24 DIAGNOSIS — Z23 Encounter for immunization: Secondary | ICD-10-CM

## 2020-07-24 DIAGNOSIS — F17210 Nicotine dependence, cigarettes, uncomplicated: Secondary | ICD-10-CM | POA: Diagnosis present

## 2020-07-24 DIAGNOSIS — R21 Rash and other nonspecific skin eruption: Secondary | ICD-10-CM

## 2020-07-24 DIAGNOSIS — K029 Dental caries, unspecified: Secondary | ICD-10-CM | POA: Diagnosis present

## 2020-07-24 DIAGNOSIS — F1920 Other psychoactive substance dependence, uncomplicated: Secondary | ICD-10-CM | POA: Diagnosis present

## 2020-07-24 DIAGNOSIS — G629 Polyneuropathy, unspecified: Secondary | ICD-10-CM | POA: Diagnosis present

## 2020-07-24 DIAGNOSIS — F4310 Post-traumatic stress disorder, unspecified: Secondary | ICD-10-CM | POA: Diagnosis present

## 2020-07-24 DIAGNOSIS — Z79899 Other long term (current) drug therapy: Secondary | ICD-10-CM

## 2020-07-24 DIAGNOSIS — L0291 Cutaneous abscess, unspecified: Secondary | ICD-10-CM

## 2020-07-24 DIAGNOSIS — F329 Major depressive disorder, single episode, unspecified: Secondary | ICD-10-CM | POA: Diagnosis present

## 2020-07-24 DIAGNOSIS — M545 Low back pain, unspecified: Secondary | ICD-10-CM

## 2020-07-24 DIAGNOSIS — Z801 Family history of malignant neoplasm of trachea, bronchus and lung: Secondary | ICD-10-CM

## 2020-07-24 DIAGNOSIS — L03211 Cellulitis of face: Secondary | ICD-10-CM | POA: Diagnosis present

## 2020-07-24 DIAGNOSIS — G894 Chronic pain syndrome: Secondary | ICD-10-CM | POA: Diagnosis present

## 2020-07-24 DIAGNOSIS — D509 Iron deficiency anemia, unspecified: Secondary | ICD-10-CM | POA: Diagnosis present

## 2020-07-24 DIAGNOSIS — F1990 Other psychoactive substance use, unspecified, uncomplicated: Secondary | ICD-10-CM

## 2020-07-24 DIAGNOSIS — G4733 Obstructive sleep apnea (adult) (pediatric): Secondary | ICD-10-CM | POA: Diagnosis present

## 2020-07-24 DIAGNOSIS — L03311 Cellulitis of abdominal wall: Secondary | ICD-10-CM | POA: Diagnosis present

## 2020-07-24 DIAGNOSIS — Z20822 Contact with and (suspected) exposure to covid-19: Secondary | ICD-10-CM | POA: Diagnosis present

## 2020-07-24 DIAGNOSIS — F1420 Cocaine dependence, uncomplicated: Secondary | ICD-10-CM | POA: Diagnosis present

## 2020-07-24 DIAGNOSIS — L0390 Cellulitis, unspecified: Secondary | ICD-10-CM

## 2020-07-24 DIAGNOSIS — K047 Periapical abscess without sinus: Secondary | ICD-10-CM | POA: Diagnosis present

## 2020-07-24 DIAGNOSIS — B1920 Unspecified viral hepatitis C without hepatic coma: Secondary | ICD-10-CM | POA: Diagnosis present

## 2020-07-24 DIAGNOSIS — Z8249 Family history of ischemic heart disease and other diseases of the circulatory system: Secondary | ICD-10-CM

## 2020-07-24 HISTORY — DX: Essential (primary) hypertension: I10

## 2020-07-24 LAB — URINALYSIS, ROUTINE W REFLEX MICROSCOPIC
Glucose, UA: NEGATIVE mg/dL
Hgb urine dipstick: NEGATIVE
Ketones, ur: NEGATIVE mg/dL
Leukocytes,Ua: NEGATIVE
Nitrite: NEGATIVE
Protein, ur: 30 mg/dL — AB
Specific Gravity, Urine: >1.03 — ABNORMAL HIGH (ref 1.005–1.030)
pH: 6 (ref 5.0–8.0)

## 2020-07-24 LAB — COMPREHENSIVE METABOLIC PANEL
ALT: 16 U/L (ref 0–44)
AST: 20 U/L (ref 15–41)
Albumin: 4.2 g/dL (ref 3.5–5.0)
Alkaline Phosphatase: 112 U/L (ref 38–126)
Anion gap: 11 (ref 5–15)
BUN: 13 mg/dL (ref 6–20)
CO2: 24 mmol/L (ref 22–32)
Calcium: 9.4 mg/dL (ref 8.9–10.3)
Chloride: 100 mmol/L (ref 98–111)
Creatinine, Ser: 1.1 mg/dL (ref 0.61–1.24)
GFR calc Af Amer: >60 mL/min (ref >60)
GFR calc non Af Amer: >60 mL/min (ref >60)
Glucose, Bld: 137 mg/dL — ABNORMAL HIGH (ref 70–99)
Potassium: 3.5 mmol/L (ref 3.5–5.1)
Sodium: 135 mmol/L (ref 135–145)
Total Bilirubin: 0.3 mg/dL (ref 0.3–1.2)
Total Protein: 9.5 g/dL — ABNORMAL HIGH (ref 6.5–8.1)

## 2020-07-24 LAB — CBC WITH DIFFERENTIAL/PLATELET
Abs Immature Granulocytes: 0.13 10*3/uL — ABNORMAL HIGH (ref 0.00–0.07)
Basophils Absolute: 0.1 10*3/uL (ref 0.0–0.1)
Basophils Relative: 0 %
Eosinophils Absolute: 0.1 10*3/uL (ref 0.0–0.5)
Eosinophils Relative: 1 %
HCT: 38.8 % — ABNORMAL LOW (ref 39.0–52.0)
Hemoglobin: 12.3 g/dL — ABNORMAL LOW (ref 13.0–17.0)
Immature Granulocytes: 1 %
Lymphocytes Relative: 15 %
Lymphs Abs: 1.9 10*3/uL (ref 0.7–4.0)
MCH: 24.2 pg — ABNORMAL LOW (ref 26.0–34.0)
MCHC: 31.7 g/dL (ref 30.0–36.0)
MCV: 76.4 fL — ABNORMAL LOW (ref 80.0–100.0)
Monocytes Absolute: 0.7 10*3/uL (ref 0.1–1.0)
Monocytes Relative: 5 %
Neutro Abs: 10 10*3/uL — ABNORMAL HIGH (ref 1.7–7.7)
Neutrophils Relative %: 78 %
Platelets: 465 10*3/uL — ABNORMAL HIGH (ref 150–400)
RBC: 5.08 MIL/uL (ref 4.22–5.81)
RDW: 15.9 % — ABNORMAL HIGH (ref 11.5–15.5)
WBC: 12.9 10*3/uL — ABNORMAL HIGH (ref 4.0–10.5)
nRBC: 0 % (ref 0.0–0.2)

## 2020-07-24 LAB — URINALYSIS, MICROSCOPIC (REFLEX)

## 2020-07-24 LAB — SARS CORONAVIRUS 2 BY RT PCR (HOSPITAL ORDER, PERFORMED IN ~~LOC~~ HOSPITAL LAB): SARS Coronavirus 2: NEGATIVE

## 2020-07-24 LAB — LACTIC ACID, PLASMA: Lactic Acid, Venous: 1.4 mmol/L (ref 0.5–1.9)

## 2020-07-24 MED ORDER — SODIUM CHLORIDE 0.9 % IV SOLN: INTRAVENOUS | Status: DC | PRN

## 2020-07-24 MED ORDER — MORPHINE SULFATE (PF) 4 MG/ML IV SOLN
4.0000 mg | Freq: Once | INTRAVENOUS | Status: AC
  Administered 2020-07-24: 4 mg via INTRAVENOUS
  Filled 2020-07-24: qty 1

## 2020-07-24 MED ORDER — SODIUM CHLORIDE 0.9 % IV BOLUS
1000.0000 mL | Freq: Once | INTRAVENOUS | Status: AC
  Administered 2020-07-24: 1000 mL via INTRAVENOUS

## 2020-07-24 MED ORDER — VANCOMYCIN HCL IN DEXTROSE 1-5 GM/200ML-% IV SOLN
1000.0000 mg | Freq: Once | INTRAVENOUS | Status: AC
  Administered 2020-07-24: 1000 mg via INTRAVENOUS
  Filled 2020-07-24: qty 200

## 2020-07-24 MED ORDER — IOHEXOL 300 MG/ML  SOLN
100.0000 mL | Freq: Once | INTRAMUSCULAR | Status: AC | PRN
  Administered 2020-07-24: 150 mL via INTRAVENOUS

## 2020-07-24 MED ORDER — CLINDAMYCIN PHOSPHATE 600 MG/50ML IV SOLN
600.0000 mg | Freq: Once | INTRAVENOUS | Status: AC
  Administered 2020-07-24: 600 mg via INTRAVENOUS
  Filled 2020-07-24: qty 50
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
## 2020-07-24 NOTE — ED Notes (Signed)
Light green, dark green, lavender, blue, lactic on ice, and BC set 1 sent to lab.

## 2020-07-24 NOTE — ED Notes (Signed)
Pt med list updated 

## 2020-07-24 NOTE — ED Notes (Signed)
Pt has three abscess areas to be evaluated, first at Rt eye, area noted to have swelling and redness. Second site is rt ulnar pulse site, swelling very noted and redness also noted. Third site is at rt side of umbilicus, oval shaped, appears much older than other sites, pt states it has been there for 3 weeks and having drainage.

## 2020-07-24 NOTE — ED Triage Notes (Addendum)
R side facial swelling since yesterday. States he has a bad tooth. Also has a large abscess to abd area. Reports back and hip pain. Hx of staph infection. He looks unwell.

## 2020-07-24 NOTE — ED Notes (Signed)
4mg  IV Morphine for rt eye pain

## 2020-07-24 NOTE — ED Notes (Signed)
Patient transported to X-ray 

## 2020-07-24 NOTE — ED Notes (Signed)
Last time used was at 8pm yesterday, injected heroin.

## 2020-07-24 NOTE — ED Notes (Signed)
ONE SET OF BC WAS OBTAINED PRIOR TO ABX THERAPY

## 2020-07-25 ENCOUNTER — Other Ambulatory Visit: Payer: Self-pay

## 2020-07-25 ENCOUNTER — Inpatient Hospital Stay (HOSPITAL_COMMUNITY): Payer: Self-pay

## 2020-07-25 DIAGNOSIS — L0390 Cellulitis, unspecified: Secondary | ICD-10-CM

## 2020-07-25 DIAGNOSIS — L03211 Cellulitis of face: Secondary | ICD-10-CM

## 2020-07-25 DIAGNOSIS — L02211 Cutaneous abscess of abdominal wall: Secondary | ICD-10-CM

## 2020-07-25 DIAGNOSIS — M7989 Other specified soft tissue disorders: Secondary | ICD-10-CM

## 2020-07-25 DIAGNOSIS — R609 Edema, unspecified: Secondary | ICD-10-CM

## 2020-07-25 LAB — COMPREHENSIVE METABOLIC PANEL
ALT: 14 U/L (ref 0–44)
AST: 16 U/L (ref 15–41)
Albumin: 3.5 g/dL (ref 3.5–5.0)
Alkaline Phosphatase: 109 U/L (ref 38–126)
Anion gap: 10 (ref 5–15)
BUN: 8 mg/dL (ref 6–20)
CO2: 25 mmol/L (ref 22–32)
Calcium: 9.2 mg/dL (ref 8.9–10.3)
Chloride: 101 mmol/L (ref 98–111)
Creatinine, Ser: 0.96 mg/dL (ref 0.61–1.24)
GFR calc Af Amer: >60 mL/min (ref >60)
GFR calc non Af Amer: >60 mL/min (ref >60)
Glucose, Bld: 101 mg/dL — ABNORMAL HIGH (ref 70–99)
Potassium: 3.9 mmol/L (ref 3.5–5.1)
Sodium: 136 mmol/L (ref 135–145)
Total Bilirubin: 0.8 mg/dL (ref 0.3–1.2)
Total Protein: 8.1 g/dL (ref 6.5–8.1)

## 2020-07-25 LAB — CBC WITH DIFFERENTIAL/PLATELET
Abs Immature Granulocytes: 0.12 10*3/uL — ABNORMAL HIGH (ref 0.00–0.07)
Basophils Absolute: 0.1 10*3/uL (ref 0.0–0.1)
Basophils Relative: 1 %
Eosinophils Absolute: 0.2 10*3/uL (ref 0.0–0.5)
Eosinophils Relative: 2 %
HCT: 34.9 % — ABNORMAL LOW (ref 39.0–52.0)
Hemoglobin: 11 g/dL — ABNORMAL LOW (ref 13.0–17.0)
Immature Granulocytes: 1 %
Lymphocytes Relative: 21 %
Lymphs Abs: 2 10*3/uL (ref 0.7–4.0)
MCH: 24.4 pg — ABNORMAL LOW (ref 26.0–34.0)
MCHC: 31.5 g/dL (ref 30.0–36.0)
MCV: 77.6 fL — ABNORMAL LOW (ref 80.0–100.0)
Monocytes Absolute: 0.8 10*3/uL (ref 0.1–1.0)
Monocytes Relative: 9 %
Neutro Abs: 6.6 10*3/uL (ref 1.7–7.7)
Neutrophils Relative %: 66 %
Platelets: 406 10*3/uL — ABNORMAL HIGH (ref 150–400)
RBC: 4.5 MIL/uL (ref 4.22–5.81)
RDW: 15.9 % — ABNORMAL HIGH (ref 11.5–15.5)
WBC: 9.8 10*3/uL (ref 4.0–10.5)
nRBC: 0 % (ref 0.0–0.2)

## 2020-07-25 LAB — BLOOD CULTURE ID PANEL (REFLEXED) - BCID2

## 2020-07-25 LAB — MRSA PCR SCREENING: MRSA by PCR: POSITIVE — AB

## 2020-07-25 MED ORDER — GABAPENTIN 300 MG PO CAPS
900.0000 mg | ORAL_CAPSULE | Freq: Two times a day (BID) | ORAL | Status: DC
  Administered 2020-07-25 – 2020-07-29 (×9): 900 mg via ORAL
  Filled 2020-07-25 (×10): qty 3

## 2020-07-25 MED ORDER — AMLODIPINE BESYLATE 5 MG PO TABS
5.0000 mg | ORAL_TABLET | Freq: Every day | ORAL | Status: DC
  Administered 2020-07-25 – 2020-08-02 (×9): 5 mg via ORAL
  Filled 2020-07-25 (×9): qty 1

## 2020-07-25 MED ORDER — HYDROXYZINE HCL 25 MG PO TABS
25.0000 mg | ORAL_TABLET | Freq: Three times a day (TID) | ORAL | Status: DC | PRN
  Administered 2020-07-25: 25 mg via ORAL
  Filled 2020-07-25: qty 1

## 2020-07-25 MED ORDER — ACETAMINOPHEN 325 MG PO TABS
650.0000 mg | ORAL_TABLET | Freq: Four times a day (QID) | ORAL | Status: DC | PRN
  Administered 2020-07-29: 650 mg via ORAL
  Filled 2020-07-25: qty 2

## 2020-07-25 MED ORDER — LORAZEPAM 1 MG PO TABS
1.0000 mg | ORAL_TABLET | Freq: Every day | ORAL | Status: DC
  Administered 2020-07-25 – 2020-08-01 (×8): 1 mg via ORAL
  Filled 2020-07-25 (×8): qty 1

## 2020-07-25 MED ORDER — MUPIROCIN 2 % EX OINT
1.0000 "application " | TOPICAL_OINTMENT | Freq: Two times a day (BID) | CUTANEOUS | Status: AC
  Administered 2020-07-25 – 2020-07-29 (×10): 1 via NASAL
  Filled 2020-07-25 (×2): qty 22

## 2020-07-25 MED ORDER — CHLORHEXIDINE GLUCONATE CLOTH 2 % EX PADS
6.0000 | MEDICATED_PAD | Freq: Every day | CUTANEOUS | Status: AC
  Administered 2020-07-25 – 2020-07-29 (×4): 6 via TOPICAL

## 2020-07-25 MED ORDER — TIZANIDINE HCL 4 MG PO TABS
4.0000 mg | ORAL_TABLET | Freq: Three times a day (TID) | ORAL | Status: DC | PRN
  Administered 2020-07-25 – 2020-08-02 (×9): 4 mg via ORAL
  Filled 2020-07-25 (×9): qty 1

## 2020-07-25 MED ORDER — HYDRALAZINE HCL 20 MG/ML IJ SOLN: 5.0000 mg | INTRAMUSCULAR | Status: DC | PRN

## 2020-07-25 MED ORDER — MORPHINE SULFATE (PF) 4 MG/ML IV SOLN
4.0000 mg | Freq: Once | INTRAVENOUS | Status: AC
  Administered 2020-07-25: 4 mg via INTRAVENOUS
  Filled 2020-07-25: qty 1

## 2020-07-25 MED ORDER — HYDROXYZINE PAMOATE 50 MG PO CAPS
100.0000 mg | ORAL_CAPSULE | Freq: Three times a day (TID) | ORAL | Status: DC
  Filled 2020-07-25 (×2): qty 2

## 2020-07-25 MED ORDER — VANCOMYCIN HCL IN DEXTROSE 1-5 GM/200ML-% IV SOLN
1000.0000 mg | Freq: Once | INTRAVENOUS | Status: AC
  Administered 2020-07-25: 1000 mg via INTRAVENOUS
  Filled 2020-07-25: qty 200

## 2020-07-25 MED ORDER — VANCOMYCIN HCL IN DEXTROSE 1-5 GM/200ML-% IV SOLN
1000.0000 mg | Freq: Once | INTRAVENOUS | Status: DC
  Filled 2020-07-25: qty 200

## 2020-07-25 MED ORDER — HYDROXYZINE HCL 50 MG PO TABS
100.0000 mg | ORAL_TABLET | Freq: Three times a day (TID) | ORAL | Status: DC
  Administered 2020-07-25 – 2020-08-01 (×22): 100 mg via ORAL
  Filled 2020-07-25 (×25): qty 2

## 2020-07-25 MED ORDER — VANCOMYCIN HCL 1500 MG/300ML IV SOLN
1500.0000 mg | Freq: Two times a day (BID) | INTRAVENOUS | Status: DC
  Administered 2020-07-25 – 2020-07-28 (×7): 1500 mg via INTRAVENOUS
  Filled 2020-07-25 (×8): qty 300

## 2020-07-25 MED ORDER — FLUOXETINE HCL 20 MG PO CAPS
20.0000 mg | ORAL_CAPSULE | Freq: Every day | ORAL | Status: DC
  Administered 2020-07-25 – 2020-08-02 (×9): 20 mg via ORAL
  Filled 2020-07-25 (×9): qty 1

## 2020-07-25 MED ORDER — MORPHINE SULFATE (PF) 2 MG/ML IV SOLN
2.0000 mg | INTRAVENOUS | Status: DC | PRN
  Administered 2020-07-25 – 2020-07-31 (×30): 2 mg via INTRAVENOUS
  Filled 2020-07-25 (×30): qty 1

## 2020-07-25 MED ORDER — BUPRENORPHINE HCL-NALOXONE HCL 2-0.5 MG SL SUBL: 1.0000 | SUBLINGUAL_TABLET | Freq: Every day | SUBLINGUAL | Status: DC

## 2020-07-25 NOTE — H&P (Signed)
History and Physical  Joshua Jacobs GNO:[id number] DOB: Aug 25, 1976 DOA: 07/24/2020  Referring physician: Accepted by Dr. Martyn Malay PCP: Patient, No Pcp Per  Outpatient Specialists: None Patient coming from: Home  Chief Complaint: R facial swelling.  HPI: Joshua Jacobs is a 44 y.o. 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  Associated with severe pain.  States the pain caused him to relapse with his IV drug addiction.  Also reports a wound on his abdomen he noted 3 days ago.  Denies any bug bites.  In the ED at [facility], CT maxillofacial revealed right facial cellulitis, probably odontogenic with infection source suspected to be carious right maxillary, subperiosteal abscess suspected near the root of the tooth.  CT abdomen and pelvis showing skin thickening with ill-defined edema involving the right lower anterior abdominal wall consistent with cellulitis.  TRH asked to admit for cellulitis.  Started on IV antibiotics empirically in the ED, IV vancomycin.  ED Course: T-max 99.4.  Hypertensive with SBP 180 and DBP 107.  Lab studies remarkable for leukocytosis with WBC 12.9K and neutrophilia 10.  Review of Systems: Review of systems as noted in the HPI. All other systems reviewed and are negative.   Past Medical History:  Diagnosis Date  . Anxiety   . Chronic hip pain   . Degenerative joint disease   . Degenerative joint disease of left hip   . Hemorrhoids   . History of MSSA bacteremia 08/2017  . Hypertension   . IV drug abuse (HCC)   . Opioid abuse (HCC)   . Polysubstance abuse (HCC)   . PTSD (post-traumatic stress disorder)   . Sweating abnormality    Past Surgical History:  Procedure Laterality Date  . TEE WITHOUT CARDIOVERSION N/A  08/18/2017   Procedure: TRANSESOPHAGEAL ECHOCARDIOGRAM (TEE);  Surgeon: Joshua Bunting, MD;  Location: [facility] ENDOSCOPY;  Service: Cardiovascular;  Laterality: N/A;  . TEE WITHOUT CARDIOVERSION  08/2017    Social History:  reports that he has been smoking cigarettes. He has a 21.00 pack-year smoking history. He has never used smokeless tobacco. He reports current drug use. Drugs: Heroin, Cocaine, Methaqualone, and IV. He reports that he does not drink alcohol.   Allergies  Allergen Reactions  . Penicillins Anaphylaxis    Has patient had a PCN reaction causing immediate rash, facial/tongue/throat swelling, SOB or lightheadedness with hypotension: Yes Has patient had a PCN reaction causing severe rash involving mucus membranes or skin necrosis: No Has patient had a PCN reaction that required hospitalization Yes Has patient had a PCN reaction occurring within the last 10 years: No If all of the above answers are "NO", then may proceed with Cephalosporin  Childhood PCN > described as chest closing/tightness.   Marland Kitchen Penicillins Anaphylaxis    Has patient had a PCN reaction causing immediate rash, facial/tongue/throat swelling, SOB or lightheadedness with hypotension: No Has patient had a PCN reaction causing severe rash involving mucus membranes or skin necrosis: No Has patient had a PCN reaction that required hospitalization: Yes Has patient had a PCN reaction occurring within the last 10 years: No If all of the above answers are "NO", then may proceed with Cephalosporin use.    Family History  Problem Relation Age of Onset  . Hypertension Mother   . Hypertension Father   .  Lung cancer Maternal Uncle       Prior to Admission medications   Medication Sig Start Date End Date Taking? Authorizing Provider  FLUoxetine (PROZAC) 20 MG capsule Take one tablet orally once a day 06/22/19  Yes [provider]  hydrochlorothiazide (HYDRODIURIL) 12.5 MG tablet Take on tablet a day 11/03/18  Yes  [provider]  hydrOXYzine (ATARAX/VISTARIL) 50 MG tablet Take one tablet three times a day as needed until next appointment 01/05/19  Yes [provider]  LORazepam (ATIVAN) 1 MG tablet Take 1 tablet (1 mg total) by mouth at bedtime. Patient taking differently: Take 1 mg by mouth 3 (three) times daily.  08/29/17  Yes Carlis Stable, PA-C  meloxicam (MOBIC) 7.5 MG tablet Take 2 tablets (15 mg total) by mouth daily. 04/14/18  Yes Roxy Horseman, PA-C  tiZANidine (ZANAFLEX) 2 MG tablet Take one tablet twice a day as needed for muscle spasms 01/05/19  Yes [provider]  acetaminophen (TYLENOL) 500 MG tablet Take 500 mg by mouth every 6 (six) hours as needed for moderate pain or headache.    [provider]  buprenorphine (SUBUTEX) 2 MG SUBL SL tablet Place 6 mg under the tongue daily.  06/09/18   [provider]  dicyclomine (BENTYL) 20 MG tablet Take 1 tablet (20 mg total) by mouth 2 (two) times daily. 04/11/19   Garlon Hatchet, PA-C  Multiple Vitamin (QUINTABS) TABS Take by mouth.    [provider]  ondansetron (ZOFRAN ODT) 4 MG disintegrating tablet Take 1 tablet (4 mg total) by mouth every 8 (eight) hours as needed for nausea. 04/11/19   Garlon Hatchet, PA-C    Physical Exam: BP (!) 180/107 (BP Location: Left Arm)   Pulse 71   Temp 98.3 F (36.8 C) (Oral)   Resp 18   Ht 5\' 6"  (1.676 m)   Wt (!) 144.3 kg   SpO2 98%   BMI 51.36 kg/m   . General: 44 y.o. year-old male morbidly obese in no acute distress.  Alert and oriented x3. . Cardiovascular: Regular rate and rhythm with no rubs or gallops.  No thyromegaly or JVD noted.  No lower extremity edema. 2/4 pulses in all 4 extremities. 46 Respiratory: Clear to auscultation with no wheezes or rales. Good inspiratory effort. . Abdomen: Soft nontender nondistended with normal bowel sounds x4 quadrants. . Muskuloskeletal: No cyanosis, clubbing or edema noted bilaterally . Neuro:  CN II-XII intact, strength, sensation, reflexes . Skin: Right facial swelling, erythema, warmth and tenderness on exam.  Abdominal wound with mild light yellow drainage. Marland Kitchen Psychiatry: Judgement and insight appear normal. Mood is appropriate for condition and setting          Labs on Admission:  Basic Metabolic Panel: Recent Labs  Lab 07/24/20 1836  NA 135  K 3.5  CL 100  CO2 24  GLUCOSE 137*  BUN 13  CREATININE 1.10  CALCIUM 9.4   Liver Function Tests: Recent Labs  Lab 07/24/20 1836  AST 20  ALT 16  ALKPHOS 112  BILITOT 0.3  PROT 9.5*  ALBUMIN 4.2   No results for input(s): LIPASE, AMYLASE in the last 168 hours. No results for input(s): AMMONIA in the last 168 hours. CBC: Recent Labs  Lab 07/24/20 1836  WBC 12.9*  NEUTROABS 10.0*  HGB 12.3*  HCT 38.8*  MCV 76.4*  PLT 465*   Cardiac Enzymes: No results for input(s): CKTOTAL, CKMB, CKMBINDEX, TROPONINI in the last 168 hours.  BNP (  last 3 results) No results for input(s): BNP in the last 8760 hours.  ProBNP (last 3 results) No results for input(s): PROBNP in the last 8760 hours.  CBG: No results for input(s): GLUCAP in the last 168 hours.  Radiological Exams on Admission: DG Chest 2 View  Result Date: 07/24/2020 CLINICAL DATA:  Suspected sepsis. Right facial swelling since yesterday. Chest pressure. EXAM: CHEST - 2 VIEW COMPARISON:  Most recent radiograph 07/29/2019. Most recent CT 03/05/2020 FINDINGS: Stable heart size and mediastinal contours. Borderline cardiomegaly. No focal airspace disease. No pulmonary edema. No pleural effusion or pneumothorax no evidence of acute osseous abnormality. IMPRESSION: No acute findings. Borderline cardiomegaly. Electronically Signed   By: Narda Rutherford M.D.   On: 07/24/2020 18:50   DG Forearm Right  Result Date: 07/24/2020 CLINICAL DATA:  Abscess.  Concern for foreign body. EXAM: RIGHT FOREARM - 2 VIEW COMPARISON:  None. FINDINGS: There is extensive soft tissue swelling  about the forearm without evidence for an acute displaced fracture or dislocation. There is no radiographic evidence for osteomyelitis. There are no definite pockets of subcutaneous gas. There is no radiopaque foreign body. IMPRESSION: Extensive soft tissue swelling about the forearm without evidence for an acute displaced fracture or dislocation. No radiopaque foreign body. No radiographic evidence for osteomyelitis. Electronically Signed   By: Katherine Mantle M.D.   On: 07/24/2020 19:44   CT ABDOMEN PELVIS W CONTRAST  Result Date: 07/24/2020 CLINICAL DATA:  Abdominal abscess. Abdominal wall abscess. History of incompletely treated lumbar infection. Patient reports back and hip pain. EXAM: CT ABDOMEN AND PELVIS WITH CONTRAST TECHNIQUE: Multidetector CT imaging of the abdomen and pelvis was performed using the standard protocol following bolus administration of intravenous contrast. CONTRAST:  OMNIPAQUE IOHEXOL 300 MG/ML  SOLN COMPARISON:  Most recent abdominal CT 07/29/2019 FINDINGS: Lower chest: No basilar consolidation or pleural fluid. Hepatobiliary: Enlarged liver spanning 22.6 cm cranial caudal. Diffuse steatosis. No focal hepatic abnormality. Gallbladder unremarkable. No biliary dilatation. Pancreas: No ductal dilatation or inflammation. Spleen: Upper normal in size spanning 12.6 cm. No focal abnormality. Adrenals/Urinary Tract: Normal adrenal glands. No hydronephrosis or perinephric edema. Homogeneous renal enhancement with symmetric excretion on delayed phase imaging. Urinary bladder is minimally distended without wall thickening. Stomach/Bowel: Fluid distended stomach without gastric wall thickening. There is no small bowel obstruction or inflammatory change. Normal appendix. Scattered colonic diverticula without diverticulitis. No colonic wall thickening. Vascular/Lymphatic: Mild aortic atherosclerosis, advanced for age. No aortic aneurysm. Patent portal vein. Prominent periportal nodes are  likely reactive. Reproductive: Prostatic calcifications Other: There is skin thickening with ill-defined edema involving the right lower anterior abdominal wall. Ill-defined fluid/edema spans approximately 3.3 x 2.0 cm in transverse by AP dimension and 2.4 cm in depth. There is no peripherally enhancing or well-circumscribed collection. No soft tissue air. No additional abscess in the abdomen or pelvis. Small fat containing umbilical hernia. Mild fat in the inguinal canals. Musculoskeletal: Previous erosive changes involving the right L4-L5 facet have healed. There is no evidence of discitis or vertebral osteomyelitis. 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 No definite acute bony destruction by CT. No evidence of intramuscular collection. IMPRESSION: 1. Skin thickening with ill-defined edema involving the right lower anterior abdominal wall consistent with cellulitis. Ill-defined fluid/edema spans approximately 3.3 x 2.0 x 2.4 cm. No peripherally enhancing or well-circumscribed collection. No soft tissue air. 2. No other findings of infection in the abdomen or pelvis. No CT findings  of acute spinal infection. 3. Hepatomegaly and hepatic steatosis. 4. Colonic diverticulosis without diverticulitis. 5. 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 No definite acute bony destruction by CT. Aortic Atherosclerosis (ICD10-I70.0). Electronically Signed   By: Narda RutherfordMelanie  Sanford M.D.   On: 07/24/2020 21:10   CT Maxillofacial W Contrast  Result Date: 07/24/2020 CLINICAL DATA:  44 year old male with right facial swelling since yesterday. Bed tooth. History of staph infection. EXAM: CT MAXILLOFACIAL WITH CONTRAST TECHNIQUE: Multidetector CT imaging of the maxillofacial structures was performed with intravenous contrast. Multiplanar CT image reconstructions were also generated.  CONTRAST:  150mL OMNIPAQUE IOHEXOL 300 MG/ML  SOLN COMPARISON:  Face CT 07/29/2018. FINDINGS: Osseous: Absent posterior dentition bilaterally with carious residual bilateral maxillary and mandible dentition. See soft tissue details below. Intact mandible. Other facial bones, central skull base, visible calvarium, an cervical vertebrae appear intact. Chronic degenerative changes about the odontoid. Orbits: Intact orbital walls. Disconjugate gaze but otherwise negative orbits soft tissues. Sinuses: Clear. Tympanic cavities and mastoids are clear. Soft tissues: Negative visible thyroid, larynx, pharynx, parapharyngeal spaces, sublingual space, submandibular spaces, parotid and masticator spaces. Negative retropharyngeal space aside from a retropharyngeal course of both carotids. Soft tissue swelling and stranding along the right anterior face overlying the buccal space. And along the right anterior maxillary alveolar process there is suggestion of a tiny subperiosteal abscess (non drainable series 3, image 36) in proximity to carious right maxillary probable canine tooth with some periapical lucency. No soft tissue gas. No other soft tissue fluid collection. Mild reactive appearing right level 1 and level 2 lymph nodes. Limited intracranial: The major vascular structures in the neck and at the skull base are patent. Partially retropharyngeal course of both carotid arteries, tortuous right ICA below the skull base. Negative visible brain parenchyma. IMPRESSION: 1. Right facial cellulitis, probably odontogenic with infection source suspected to be the carious right maxillary canine with a subtle/trace subperiosteal abscess suspected near the root of that tooth. 2. Carious dentition elsewhere. Mild reactive right level 1 and level 2 lymph nodes. Electronically Signed   By: Odessa FlemingH  Bettymae Yott M.D.   On: 07/24/2020 21:12    EKG: I independently viewed the EKG done and my findings are as followed: None available at the time of this  exam.  Assessment/Plan Present on Admission: . Cellulitis  Active Problems:   Cellulitis  Right facial/abdominal cellulitis, POA Presented with right facial edema, erythema, warmth and tenderness CT maxillofacial suggestive of cellulitis Also has an abdominal wound, CT abdomen and pelvis suggestive of cellulitis Started on IV antibiotics empirically, IV vancomycin Obtain an MRSA screening Obtain blood cultures x2 peripherally, follow cultures Monitor fever curve and WBC  Dental caries Suspected source of right facial cellulitis Consider dental surgery consult in the morning  Essential hypertension BP is not at goal Start Norvasc IV hydralazine as needed with parameters Monitor vital signs  Polysubstance abuse including cocaine and IV drug abuse Obtain UDS Polysubstance abuse cessation counseling done at bedside States he had quit IV drug use for 2 months and relapsed again yesterday due to severe pain Resume home Subutex  Severe morbid obesity BMI 51 Recommend weight loss outpatient with a regular physical activity and healthy dieting  Chronic anxiety/depression Resume home Prozac and hydroxyzine    DVT prophylaxis: Subcu Lovenox daily  Code Status: Full code as stated by the patient himself  Family Communication: None at bedside  Disposition Plan: Admit to telemetry medical  Consults called: Wound  care specialist  Admission status: Inpatient status   Status is: Inpatient    Dispo: The patient is from: Home.               Anticipated d/c is to: Home.              Anticipated d/c date is: 07/27/2020               Patient currently not medically stable for discharge due to ongoing management of cellulitis.        Darlin Drop MD [facility] Pager [phone]  If 7PM-7AM, please contact night-coverage [email or web address] Password [facility]  07/25/2020, 3:56 AM

## 2020-07-25 NOTE — Progress Notes (Signed)
Notified [facility] that pt arrived to unit transported by [facility] from [facility] as a new admission. Requested Dr be assigned to pt.  and requested orders.

## 2020-07-25 NOTE — Progress Notes (Signed)
PHARMACY - PHYSICIAN COMMUNICATION CRITICAL VALUE ALERT - BLOOD CULTURE IDENTIFICATION (BCID)  Joshua Jacobs is an 44 y.o. male who presented to [facility] on 07/24/2020  Assessment:  59 yom with history of polysubstance abuse presenting with R facial/abdominal cellulitis, initiated on vancomycin. BCx now positive for MSSA in 1/2 bottles (only 1 set drawn). Noted "anaphylaxis" documented to PCN with history of beta-lactam use in chart.  Name of physician (or Provider) Contacted: Blount, X  Current antibiotics: vancomycin  Changes to prescribed antibiotics recommended:  Continue vancomycin for now per discussion with Dr. Bruna Potter. She will leave the decision to trial cefazolin up to attending MD in the AM.  Results for orders placed or performed during the hospital encounter of 07/24/20  Blood Culture ID Panel (Reflexed) (Collected: 07/24/2020  6:37 PM)  Result Value Ref Range   Enterococcus faecalis NOT DETECTED NOT DETECTED   Enterococcus Faecium NOT DETECTED NOT DETECTED   Listeria monocytogenes NOT DETECTED NOT DETECTED   Staphylococcus species DETECTED (A) NOT DETECTED   Staphylococcus aureus (BCID) DETECTED (A) NOT DETECTED   Staphylococcus epidermidis NOT DETECTED NOT DETECTED   Staphylococcus lugdunensis NOT DETECTED NOT DETECTED   Streptococcus species NOT DETECTED NOT DETECTED   Streptococcus agalactiae NOT DETECTED NOT DETECTED   Streptococcus pneumoniae NOT DETECTED NOT DETECTED   Streptococcus pyogenes NOT DETECTED NOT DETECTED   A.calcoaceticus-baumannii NOT DETECTED NOT DETECTED   Bacteroides fragilis NOT DETECTED NOT DETECTED   Enterobacterales NOT DETECTED NOT DETECTED   Enterobacter cloacae complex NOT DETECTED NOT DETECTED   Escherichia coli NOT DETECTED NOT DETECTED   Klebsiella aerogenes NOT DETECTED NOT DETECTED   Klebsiella oxytoca NOT DETECTED NOT DETECTED   Klebsiella pneumoniae NOT DETECTED NOT DETECTED   Proteus species NOT DETECTED NOT DETECTED    Salmonella species NOT DETECTED NOT DETECTED   Serratia marcescens NOT DETECTED NOT DETECTED   Haemophilus influenzae NOT DETECTED NOT DETECTED   Neisseria meningitidis NOT DETECTED NOT DETECTED   Pseudomonas aeruginosa NOT DETECTED NOT DETECTED   Stenotrophomonas maltophilia NOT DETECTED NOT DETECTED   Candida albicans NOT DETECTED NOT DETECTED   Candida auris NOT DETECTED NOT DETECTED   Candida glabrata NOT DETECTED NOT DETECTED   Candida krusei NOT DETECTED NOT DETECTED   Candida parapsilosis NOT DETECTED NOT DETECTED   Candida tropicalis NOT DETECTED NOT DETECTED   Cryptococcus neoformans/gattii NOT DETECTED NOT DETECTED   Meth resistant mecA/C and MREJ NOT DETECTED NOT DETECTED     Leia Alf, PharmD, BCPS Please check AMION for all [facility] contact numbers Clinical Pharmacist 07/25/2020 8:55 PM

## 2020-07-25 NOTE — Progress Notes (Signed)
Pharmacy Antibiotic Note  Joshua Jacobs is a 44 y.o. male admitted on 07/24/2020 with cellulitis.  Pharmacy has been consulted for vancomycin dosing. -WBC= 12.9, afebrile, CrCl > 100  Plan: -vancomycin 2000mg  IV x1 followed by 1500mg  IV q12h -Will follow renal function, cultures and clinical progress    Height: 5\' 6"  (167.6 cm) Weight: (!) 144.3 kg (318 lb 3.2 oz) IBW/kg (Calculated) : 63.8  Temp (24hrs), Avg:98.9 F (37.2 C), Min:98.4 F (36.9 C), Max:99.4 F (37.4 C)  Recent Labs  Lab 07/24/20 1836  WBC 12.9*  CREATININE 1.10  LATICACIDVEN 1.4    Estimated Creatinine Clearance: 120 mL/min (by C-G formula based on SCr of 1.1 mg/dL).    Allergies  Allergen Reactions  . Penicillins Anaphylaxis    Has patient had a PCN reaction causing immediate rash, facial/tongue/throat swelling, SOB or lightheadedness with hypotension: Yes Has patient had a PCN reaction causing severe rash involving mucus membranes or skin necrosis: No Has patient had a PCN reaction that required hospitalization Yes Has patient had a PCN reaction occurring within the last 10 years: No If all of the above answers are "NO", then may proceed with Cephalosporin  Childhood PCN > described as chest closing/tightness.   Penicillins Anaphylaxis    Has patient had a PCN reaction causing immediate rash, facial/tongue/throat swelling, SOB or lightheadedness with hypotension: No Has patient had a PCN reaction causing severe rash involving mucus membranes or skin necrosis: No Has patient had a PCN reaction that required hospitalization: Yes Has patient had a PCN reaction occurring within the last 10 years: No If all of the above answers are "NO", then may proceed with Cephalosporin use.      Thank you for allowing pharmacy to be a part of this patient's care.  , PharmD Clinical Pharmacist **Pharmacist phone directory can now be found on [email or web address] ([email or web address]).  Listed under [facility].

## 2020-07-25 NOTE — Progress Notes (Addendum)
Patient seen and examined, admitted by Dr. Raphael Gibney this a.m.  Briefly 44 year old male with history of IV drug use, severe morbid obesity, essential hypertension, OSA, history of osteomyelitis spine, tobacco use, dental caries, hepatitis C, chronic pain syndrome presented with right-sided facial swelling, severe pain. Reports that pain caused him to relapse with history of IV drug addiction. Also reported wound on his abdomen 3 days ago, denied any bug bites. CT maxillofacial revealed right-sided facial cellulitis probably odontogenic with infection source suspected to be subperiosteal abscess near the root of the tooth. CT abdomen pelvis consistent with cellulitis in the right lower abdominal wall.  BP 120/70 (BP Location: Left Wrist)   Pulse (!) 59   Temp 98.5 F (36.9 C) (Oral)   Resp 17   Ht 5\' 6"  (1.676 m)   Wt (!) 144.3 kg   SpO2 95%   BMI 51.36 kg/m   Physical Exam  General: Alert and oriented x 3, NAD, right-sided facial swelling  Cardiovascular: S1 S2 clear, RRR  Respiratory: CTAB, no wheezing, rales or rhonchi  Gastrointestinal: Soft, normal bowel sounds, right abdominal wound, mildly tender with cellulitis  Ext: no pedal edema bilaterally  Neuro: no new deficits  Musculoskeletal: No cyanosis, clubbing  Skin: Multiple abrasions and scars on the right shoulder, elbow, wrist, digits. Noted to have edema of the right hand and arm  Psych: Normal affect and demeanor, alert and oriented x3   A/p Right facial cellulitis secondary to odontogenic source -CT maxillofacial shows right facial cellulitis suspected to be carious right maxillary canine with a subtle/trace subperiosteal abscess suspected near the root of the tooth. No orbital involvement -on call Dental surgery consulted, discussed with Dr. , recommended to continue IV antibiotics while inpatient, discharge on Augmentin when medically ready. Recommended patient make an appointment within 1 week,  will see patient in office. -Continue pain control, placed on full liquid diet  Right hand and arm edema -Per patient he has on and off swelling in his arms, has a history of IV drug use however his right hand and arm significantly got swollen overnight. No pain -Requested hand surgery to evaluate, appreciate recommendations, rule out DVT  Right-sided abdominal wound -Per patient, it spontaneously drained, continue IV antibiotics, if no significant improvement will have surgery consult to assess if he needs I&D  Hx IVDU, recent relapse due to pain -Polysubstance abuse cessation counseling done, currently complaining of significant pain due to right facial cellulitis and dental abscess Continue pain control, resume home Subutex on discharge   Time spent: Loura Pardon, examining the patient, chart review and consultations.  M.D. [facility] 07/25/2020, 10:48 AM

## 2020-07-25 NOTE — Progress Notes (Signed)
Notified X. Blount that diet order requested.

## 2020-07-25 NOTE — Plan of Care (Signed)
°  Problem: Clinical Measurements: Goal: Ability to avoid or minimize complications of infection will improve Outcome: Progressing   Problem: Skin Integrity: Goal: Skin integrity will improve Outcome: Progressing   Problem: Activity: Goal: Risk for activity intolerance will decrease Outcome: Progressing   Problem: Coping: Goal: Level of anxiety will decrease Outcome: Progressing   Problem: Pain Managment: Goal: General experience of comfort will improve Outcome: Progressing   Problem: Safety: Goal: Ability to remain free from injury will improve Outcome: Progressing   Problem: Skin Integrity: Goal: Risk for impaired skin integrity will decrease Outcome: Progressing

## 2020-07-25 NOTE — Progress Notes (Signed)
Right upper ext. study completed.   See CVProc for preliminary results.   Jannet Askew, RDMS, RVT

## 2020-07-25 NOTE — Progress Notes (Signed)
Lab called with positive PCR swab for MRSA.  Information was given to day shift RN to follow up.

## 2020-07-25 NOTE — Consult Note (Signed)
Reason for Consult:Right hand swelling Referring Physician: R Rai  Joshua Jacobs is an 44 y.o. male.  HPI: Joshua Jacobs was admitted yesterday with facial cellulitis. Overnight his right hand swelled. He denies pain or prior e/o. The hospitalist wanted to r/o compartment syndrome and consulted hand surgery. He is RHD.  Past Medical History:  Diagnosis Date  . Anxiety   . Chronic hip pain   . Degenerative joint disease   . Degenerative joint disease of left hip   . Hemorrhoids   . History of MSSA bacteremia 08/2017  . Hypertension   . IV drug abuse (HCC)   . Opioid abuse (HCC)   . Polysubstance abuse (HCC)   . PTSD (post-traumatic stress disorder)   . Sweating abnormality     Past Surgical History:  Procedure Laterality Date  . TEE WITHOUT CARDIOVERSION N/A 08/18/2017   Procedure: TRANSESOPHAGEAL ECHOCARDIOGRAM (TEE);  Surgeon: Lewayne Buntingrenshaw, Brian S, MD;  [facility];  Service: Cardiovascular;  Laterality: N/A;  . TEE WITHOUT CARDIOVERSION  08/2017    Family History  Problem Relation Age of Onset  . Hypertension Mother   . Hypertension Father   . Lung cancer Maternal Uncle     Social History:  reports that he has been smoking cigarettes. He has a 21.00 pack-year smoking history. He has never used smokeless tobacco. He reports current drug use. Drugs: Heroin, Cocaine, Methaqualone, and IV. He reports that he does not drink alcohol.  Allergies:  Allergies  Allergen Reactions  . Penicillins Anaphylaxis    Has patient had a PCN reaction causing immediate rash, facial/tongue/throat swelling, SOB or lightheadedness with hypotension: Yes Has patient had a PCN reaction causing severe rash involving mucus membranes or skin necrosis: No Has patient had a PCN reaction that required hospitalization Yes Has patient had a PCN reaction occurring within the last 10 years: No If all of the above answers are "NO", then may proceed with Cephalosporin  Childhood PCN > described as chest  closing/tightness.   Marland Kitchen. Penicillins Anaphylaxis    Has patient had a PCN reaction causing immediate rash, facial/tongue/throat swelling, SOB or lightheadedness with hypotension: No Has patient had a PCN reaction causing severe rash involving mucus membranes or skin necrosis: No Has patient had a PCN reaction that required hospitalization: Yes Has patient had a PCN reaction occurring within the last 10 years: No If all of the above answers are "NO", then may proceed with Cephalosporin use.    Medications: I have reviewed the patient's current medications.  Results for orders placed or performed during the hospital encounter of 07/24/20 (from the past 48 hour(s))  Lactic acid, plasma     Status: None   Collection Time: 07/24/20  6:36 PM  Result Value Ref Range   Lactic Acid, Venous 1.4 0.5 - 1.9 mmol/L    Comment: Performed at [facility] [place], [address] [place], [place] [address]  Comprehensive metabolic panel     Status: Abnormal   Collection Time: 07/24/20  6:36 PM  Result Value Ref Range   Sodium 135 135 - 145 mmol/L   Potassium 3.5 3.5 - 5.1 mmol/L   Chloride 100 98 - 111 mmol/L   CO2 24 22 - 32 mmol/L   Glucose, Bld 137 (H) 70 - 99 mg/dL    Comment: Glucose reference range applies only to samples taken after fasting for at least 8 hours.   BUN 13 6 - 20 mg/dL   Creatinine, Ser 6.061.10 0.61 - 1.24 mg/dL  Calcium 9.4 8.9 - 10.3 mg/dL   Total Protein 9.5 (H) 6.5 - 8.1 g/dL   Albumin 4.2 3.5 - 5.0 g/dL   AST 20 15 - 41 U/L   ALT 16 0 - 44 U/L   Alkaline Phosphatase 112 38 - 126 U/L   Total Bilirubin 0.3 0.3 - 1.2 mg/dL   GFR calc non Af Amer >60 >60 mL/min   GFR calc Af Amer >60 >60 mL/min   Anion gap 11 5 - 15    Comment: Performed at [facility], [address] [place], [place] [address]  CBC with Differential     Status: Abnormal   Collection Time: 07/24/20  6:36 PM  Result Value Ref Range   WBC 12.9 (H) 4.0 - 10.5 K/uL   RBC 5.08 4.22 - 5.81  MIL/uL   Hemoglobin 12.3 (L) 13.0 - 17.0 g/dL   HCT 62.2 (L) 39 - 52 %   MCV 76.4 (L) 80.0 - 100.0 fL   MCH 24.2 (L) 26.0 - 34.0 pg   MCHC 31.7 30.0 - 36.0 g/dL   RDW 29.7 (H) 98.9 - 21.1 %   Platelets 465 (H) 150 - 400 K/uL   nRBC 0.0 0.0 - 0.2 %   Neutrophils Relative % 78 %   Neutro Abs 10.0 (H) 1.7 - 7.7 K/uL   Lymphocytes Relative 15 %   Lymphs Abs 1.9 0.7 - 4.0 K/uL   Monocytes Relative 5 %   Monocytes Absolute 0.7 0 - 1 K/uL   Eosinophils Relative 1 %   Eosinophils Absolute 0.1 0 - 0 K/uL   Basophils Relative 0 %   Basophils Absolute 0.1 0 - 0 K/uL   Immature Granulocytes 1 %   Abs Immature Granulocytes 0.13 (H) 0.00 - 0.07 K/uL    Comment: Performed at [facility], [address] [place], [place] [address]  Culture, blood (routine x 2)     Status: None (Preliminary result)   Collection Time: 07/24/20  6:37 PM   Specimen: BLOOD RIGHT ARM  Result Value Ref Range   Specimen Description      BLOOD RIGHT ARM Performed at [facility], [address] [place], [place] [address]    Special Requests      BOTTLES DRAWN AEROBIC AND ANAEROBIC Blood Culture adequate volume Performed at [facility], [address] [place], [place] [address]    Culture      NO GROWTH < 12 HOURS Performed at [facility], [address] [place], [place] [address]    Report Status PENDING   Urinalysis, Routine w reflex microscopic Urine, Clean Catch     Status: Abnormal   Collection Time: 07/24/20  7:28 PM  Result Value Ref Range   Color, Urine YELLOW YELLOW   APPearance CLOUDY (A) CLEAR   Specific Gravity, Urine >1.030 (H) 1.005 - 1.030   pH 6.0 5.0 - 8.0   Glucose, UA NEGATIVE NEGATIVE mg/dL   Hgb urine dipstick NEGATIVE NEGATIVE   Bilirubin Urine SMALL (A) NEGATIVE   Ketones, ur NEGATIVE NEGATIVE mg/dL   Protein, ur 30 (A) NEGATIVE mg/dL   Nitrite NEGATIVE NEGATIVE   Leukocytes,Ua NEGATIVE NEGATIVE    Comment: Performed at [facility], [address] [place], [place] [address]  SARS Coronavirus 2 by RT PCR (hospital order, performed in [facility] hospital lab) Nasopharyngeal Peripheral     Status: None   Collection  Time: 07/24/20  7:28 PM   Specimen: Peripheral; Nasopharyngeal  Result Value Ref Range   SARS Coronavirus 2 NEGATIVE NEGATIVE    Comment: (NOTE) SARS-CoV-2 target nucleic acids are NOT DETECTED.  The SARS-CoV-2 RNA is generally detectable in upper and lower respiratory specimens during the acute phase of infection. The lowest concentration of SARS-CoV-2 viral copies this assay can detect is 250 copies / mL. A negative result does not preclude SARS-CoV-2 infection and should not be used as the sole basis for treatment or other patient management decisions.  A negative result may occur with improper specimen collection / handling, submission of specimen other than nasopharyngeal swab, presence of viral mutation(s) within the areas targeted by this assay, and inadequate number of viral copies (<250 copies / mL). A negative result must be combined with clinical observations, patient history, and epidemiological information.  Fact Sheet for Patients:   BoilerBrush.com.cy  Fact Sheet for Healthcare Providers: https://pope.com/  This test is not yet approved or  cleared by the [place] FDA and has been authorized for detection and/or diagnosis of SARS-CoV-2 by FDA under an Emergency Use Authorization (EUA).  This EUA will remain in effect (meaning this test can be used) for the duration of the COVID-19 declaration under Section 564(b)(1) of the Act, 21 U.S.C. section 360bbb-3(b)(1), unless the authorization is terminated or revoked sooner.  Performed at [facility], [address] [place], [place] [address]   Urinalysis, Microscopic (reflex)     Status: Abnormal   Collection Time: 07/24/20  7:28 PM  Result Value Ref Range   RBC  / HPF 0-5 0 - 5 RBC/hpf   WBC, UA 0-5 0 - 5 WBC/hpf   Bacteria, UA RARE (A) NONE SEEN   Squamous Epithelial / LPF 6-10 0 - 5   Mucus PRESENT     Comment: Performed at [facility], [address] [place], [place] [address]  MRSA PCR Screening     Status: Abnormal   Collection Time: 07/25/20  4:21 AM   Specimen: Nasopharyngeal  Result Value Ref Range   MRSA by PCR POSITIVE (A) NEGATIVE    Comment:        The GeneXpert MRSA Assay (FDA approved for NASAL specimens only), is one component of a comprehensive MRSA colonization surveillance program. It is not intended to diagnose MRSA infection nor to guide or monitor treatment for MRSA infections. RESULT CALLED TO, READ BACK BY AND VERIFIED WITH: RN Jasmine December [facility] 0981 [phone] FCP Performed at [facility], [address], [place], [place] [address]     DG Chest 2 View  Result Date: 07/24/2020 CLINICAL DATA:  Suspected sepsis. Right facial swelling since yesterday. Chest pressure. EXAM: CHEST - 2 VIEW COMPARISON:  Most recent radiograph 07/29/2019. Most recent CT 03/05/2020 FINDINGS: Stable heart size and mediastinal contours. Borderline cardiomegaly. No focal airspace disease. No pulmonary edema. No pleural effusion or pneumothorax no evidence of acute osseous abnormality. IMPRESSION: No acute findings. Borderline cardiomegaly. Electronically Signed   By: Narda Rutherford M.D.   On: 07/24/2020 18:50   DG Forearm Right  Result Date: 07/24/2020 CLINICAL DATA:  Abscess.  Concern for foreign body. EXAM: RIGHT FOREARM - 2 VIEW COMPARISON:  None. FINDINGS: There is extensive soft tissue swelling about the forearm without evidence for an acute displaced fracture or dislocation. There is no radiographic evidence for osteomyelitis. There are no definite pockets of subcutaneous gas. There is no radiopaque foreign body. IMPRESSION: Extensive soft  tissue swelling about the forearm without evidence for an acute displaced fracture or  dislocation. No radiopaque foreign body. No radiographic evidence for osteomyelitis. Electronically Signed   By: Katherine Mantle M.D.   On: 07/24/2020 19:44   CT ABDOMEN PELVIS W CONTRAST  Result Date: 07/24/2020 CLINICAL DATA:  Abdominal abscess. Abdominal wall abscess. History of incompletely treated lumbar infection. Patient reports back and hip pain. EXAM: CT ABDOMEN AND PELVIS WITH CONTRAST TECHNIQUE: Multidetector CT imaging of the abdomen and pelvis was performed using the standard protocol following bolus administration of intravenous contrast. CONTRAST:  OMNIPAQUE IOHEXOL 300 MG/ML  SOLN COMPARISON:  Most recent abdominal CT 07/29/2019 FINDINGS: Lower chest: No basilar consolidation or pleural fluid. Hepatobiliary: Enlarged liver spanning 22.6 cm cranial caudal. Diffuse steatosis. No focal hepatic abnormality. Gallbladder unremarkable. No biliary dilatation. Pancreas: No ductal dilatation or inflammation. Spleen: Upper normal in size spanning 12.6 cm. No focal abnormality. Adrenals/Urinary Tract: Normal adrenal glands. No hydronephrosis or perinephric edema. Homogeneous renal enhancement with symmetric excretion on delayed phase imaging. Urinary bladder is minimally distended without wall thickening. Stomach/Bowel: Fluid distended stomach without gastric wall thickening. There is no small bowel obstruction or inflammatory change. Normal appendix. Scattered colonic diverticula without diverticulitis. No colonic wall thickening. Vascular/Lymphatic: Mild aortic atherosclerosis, advanced for age. No aortic aneurysm. Patent portal vein. Prominent periportal nodes are likely reactive. Reproductive: Prostatic calcifications Other: There is skin thickening with ill-defined edema involving the right lower anterior abdominal wall. Ill-defined fluid/edema spans approximately 3.3 x 2.0 cm in transverse by AP dimension and 2.4 cm in depth. There is no peripherally enhancing or well-circumscribed collection.  No soft tissue air. No additional abscess in the abdomen or pelvis. Small fat containing umbilical hernia. Mild fat in the inguinal canals. Musculoskeletal: Previous erosive changes involving the right L4-L5 facet have healed. There is no evidence of discitis or vertebral osteomyelitis. 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 No definite acute bony destruction by CT. No evidence of intramuscular collection. IMPRESSION: 1. Skin thickening with ill-defined edema involving the right lower anterior abdominal wall consistent with cellulitis. Ill-defined fluid/edema spans approximately 3.3 x 2.0 x 2.4 cm. No peripherally enhancing or well-circumscribed collection. No soft tissue air. 2. No other findings of infection in the abdomen or pelvis. No CT findings of acute spinal infection. 3. Hepatomegaly and hepatic steatosis. 4. Colonic diverticulosis without diverticulitis. 5. 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 No definite acute bony destruction by CT. Aortic Atherosclerosis (ICD10-I70.0). Electronically Signed   By: Narda Rutherford M.D.   On: 07/24/2020 21:10   CT Maxillofacial W Contrast  Result Date: 07/24/2020 CLINICAL DATA:  44 year old male with right facial swelling since yesterday. Bed tooth. History of staph infection. EXAM: CT MAXILLOFACIAL WITH CONTRAST TECHNIQUE: Multidetector CT imaging of the maxillofacial structures was performed with intravenous contrast. Multiplanar CT image reconstructions were also generated. CONTRAST:  OMNIPAQUE IOHEXOL 300 MG/ML  SOLN COMPARISON:  Face CT 07/29/2018. FINDINGS: Osseous: Absent posterior dentition bilaterally with carious residual bilateral maxillary and mandible dentition. See soft tissue details below. Intact mandible. Other facial bones, central skull base, visible calvarium, an cervical vertebrae  appear intact. Chronic degenerative changes about the odontoid. Orbits: Intact orbital walls. Disconjugate gaze but otherwise negative orbits soft tissues. Sinuses: Clear. Tympanic cavities and mastoids are clear. Soft tissues: Negative visible thyroid, larynx, pharynx, parapharyngeal spaces, sublingual space, submandibular spaces, parotid and  masticator spaces. Negative retropharyngeal space aside from a retropharyngeal course of both carotids. Soft tissue swelling and stranding along the right anterior face overlying the buccal space. And along the right anterior maxillary alveolar process there is suggestion of a tiny subperiosteal abscess (non drainable series 3, image 36) in proximity to carious right maxillary probable canine tooth with some periapical lucency. No soft tissue gas. No other soft tissue fluid collection. Mild reactive appearing right level 1 and level 2 lymph nodes. Limited intracranial: The major vascular structures in the neck and at the skull base are patent. Partially retropharyngeal course of both carotid arteries, tortuous right ICA below the skull base. Negative visible brain parenchyma. IMPRESSION: 1. Right facial cellulitis, probably odontogenic with infection source suspected to be the carious right maxillary canine with a subtle/trace subperiosteal abscess suspected near the root of that tooth. 2. Carious dentition elsewhere. Mild reactive right level 1 and level 2 lymph nodes. Electronically Signed   By: Odessa Fleming M.D.   On: 07/24/2020 21:12    Review of Systems  HENT: Negative for ear discharge, ear pain, hearing loss and tinnitus.   Eyes: Negative for photophobia and pain.  Respiratory: Negative for cough and shortness of breath.   Cardiovascular: Negative for chest pain.  Gastrointestinal: Negative for abdominal pain, nausea and vomiting.  Genitourinary: Negative for dysuria, flank pain, frequency and urgency.  Musculoskeletal: Positive for joint swelling (Right hand).  Negative for back pain, myalgias and neck pain.  Neurological: Negative for dizziness and headaches.  Hematological: Does not bruise/bleed easily.  Psychiatric/Behavioral: The patient is not nervous/anxious.    Blood pressure 120/70, pulse (!) 59, temperature 98.5 F (36.9 C), temperature source Oral, resp. rate 17, height 5\' 6"  (1.676 m), weight (!) 144.3 kg, SpO2 95 %. Physical Exam Constitutional:      General: He is not in acute distress.    Appearance: He is well-developed. He is not diaphoretic.  HENT:     Head: Normocephalic and atraumatic.  Eyes:     General: No scleral icterus.       Right eye: No discharge.        Left eye: No discharge.     Conjunctiva/sclera: Conjunctivae normal.  Cardiovascular:     Rate and Rhythm: Normal rate and regular rhythm.  Pulmonary:     Effort: Pulmonary effort is normal. No respiratory distress.  Musculoskeletal:     Cervical back: Normal range of motion.     Comments: Right shoulder, elbow, wrist, digits- Multiple abrasions/scars notes with superficial volar ulceration proximal to wrist, mild edema hand, nontender, no instability, no blocks to motion  Sens  Ax/R/M/U intact  Mot   Ax/ R/ PIN/ M/ AIN/ U intact  Rad 2+  Skin:    General: Skin is warm and dry.  Neurological:     Mental Status: He is alert.  Psychiatric:        Behavior: Behavior normal.     Assessment/Plan: Right hand edema -- Will check doppler to r/o DVT. Given painlessness he has r/o for compartment syndrome or infection. No surgical remedy here. Hand surgery will sign off. Multiple medical problems including IV drug abuse on Subutex, severe morbid obesity, essential hypertension, OSA, history of osteomyelitis of the spine, chronic pain syndrome, tobacco use disorder, dental caries, and hepatitis C -- per primary service    , PA-C Orthopedic Surgery [phone] 07/25/2020, 10:23 AM

## 2020-07-26 ENCOUNTER — Inpatient Hospital Stay (HOSPITAL_COMMUNITY): Payer: Self-pay

## 2020-07-26 DIAGNOSIS — F1990 Other psychoactive substance use, unspecified, uncomplicated: Secondary | ICD-10-CM

## 2020-07-26 DIAGNOSIS — M272 Inflammatory conditions of jaws: Secondary | ICD-10-CM

## 2020-07-26 DIAGNOSIS — R7881 Bacteremia: Secondary | ICD-10-CM

## 2020-07-26 LAB — RAPID URINE DRUG SCREEN, HOSP PERFORMED
Amphetamines: NOT DETECTED
Barbiturates: NOT DETECTED
Benzodiazepines: NOT DETECTED
Cocaine: POSITIVE — AB
Opiates: POSITIVE — AB
Tetrahydrocannabinol: NOT DETECTED

## 2020-07-26 LAB — HEPATITIS PANEL, ACUTE
HCV Ab: REACTIVE — AB
Hep A IgM: NONREACTIVE
Hep B C IgM: NONREACTIVE
Hepatitis B Surface Ag: NONREACTIVE

## 2020-07-26 LAB — HIV ANTIBODY (ROUTINE TESTING W REFLEX): HIV Screen 4th Generation wRfx: NONREACTIVE

## 2020-07-26 MED ORDER — CLINDAMYCIN PHOSPHATE 300 MG/50ML IV SOLN
300.0000 mg | Freq: Three times a day (TID) | INTRAVENOUS | Status: DC
  Administered 2020-07-26 – 2020-07-28 (×6): 300 mg via INTRAVENOUS
  Filled 2020-07-26 (×9): qty 50

## 2020-07-26 MED ORDER — CHLORHEXIDINE GLUCONATE 0.12 % MT SOLN
15.0000 mL | Freq: Four times a day (QID) | OROMUCOSAL | Status: DC
  Administered 2020-07-26 – 2020-08-01 (×20): 15 mL via OROMUCOSAL
  Filled 2020-07-26 (×21): qty 15

## 2020-07-26 NOTE — Progress Notes (Signed)
  Echocardiogram 2D Echocardiogram has been performed.  Delcie Roch 07/26/2020, 4:55 PM

## 2020-07-26 NOTE — Consult Note (Signed)
[facility]    Date of Admission:  07/24/2020   Total days of antibiotics: 2 vanco               Reason for Consult: Staph aureus bacteremia    Referring Provider: CHAMP!   Assessment: Staph bacteremia IVDA Cellulitis Dental space infection Hepatitis C (immune?)  Plan: 1. 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 His BCID panel did not flag MRSA 3. Restart clinda for dental infection 4. Chlorhexidine oral washes 5. Ask dental to f/u 6. Check Hep C geno, VL 7. Repeat Hep B and A serologies- vaccinate if (-) 8. Check HIV serology 9. Check TTE 10. Repeat BCx in AM 11. MRI spine and hip  Thank you so much for this interesting consult,  Active Problems:   Cellulitis    amLODipine  5 mg Oral Daily   Chlorhexidine Gluconate Cloth  6 each Topical Q0600   FLUoxetine  20 mg Oral Daily   gabapentin  900 mg Oral BID   hydrOXYzine  100 mg Oral TID   LORazepam  1 mg Oral QHS   mupirocin ointment  1 application Nasal BID    HPI: Joshua Jacobs is a 44 y.o. male with hx of Hep C , IVDA, comes to ED on 9-9 with IVDA relapse, facial cellulitis/dental infection and R lower abd wall cellulitis.  He had BCx done showing 1/2 MSSA.  He has been afebrile. WBC 12.9.   Denies that his abd wound is from injection site (only injects in his arms).   States he was ab+ for Hep C but never treated.   His hx is also notable for back pain and R hip pain for last 18 months after previous infection managed at [facility].   States he has not been able to go to dentist due to inability to pay.   Review of Systems: Review of Systems  Constitutional: Negative for fever and weight loss.  Gastrointestinal: Negative for constipation and diarrhea.  Genitourinary: Negative for dysuria.  Please see HPI. All other systems reviewed and negative.   Past Medical History:  Diagnosis Date   Anxiety    Chronic hip pain    Degenerative joint  disease    Degenerative joint disease of left hip    Hemorrhoids    History of MSSA bacteremia 08/2017   Hypertension    IV drug abuse (HCC)    Opioid abuse (HCC)    Polysubstance abuse (HCC)    PTSD (post-traumatic stress disorder)    Sweating abnormality     Social History   Tobacco Use   Smoking status: Current Every Day Smoker    Packs/day: 1.00    Years: 21.00    Pack years: 21.00    Types: Cigarettes    Last attempt to quit: 08/19/2017    Years since quitting: 2.9   Smokeless tobacco: Never Used  Vaping Use   Vaping Use: Never used  Substance Use Topics   Alcohol use: No   Drug use: Yes    Types: Heroin, Cocaine, Methaqualone, IV    Comment: reports no drug use IV in 6 months - 07/29/2019    Family History  Problem Relation Age of Onset   Hypertension Mother    Hypertension Father    Lung cancer Maternal Uncle      Medications:  Scheduled:  amLODipine  5 mg Oral Daily   Chlorhexidine Gluconate Cloth  6  each Topical Q0600   FLUoxetine  20 mg Oral Daily   gabapentin  900 mg Oral BID   hydrOXYzine  100 mg Oral TID   LORazepam  1 mg Oral QHS   mupirocin ointment  1 application Nasal BID    Abtx:  Anti-infectives (From admission, onward)   Start     Dose/Rate Route Frequency Ordered Stop   07/25/20 1000  vancomycin (VANCOREADY) IVPB 1500 mg/300 mL        1,500 mg 150 mL/hr over 120 Minutes Intravenous Every 12 hours 07/25/20 0736     07/25/20 0200  vancomycin (VANCOCIN) IVPB 1000 mg/200 mL premix  Status:  Discontinued        1,000 mg 200 mL/hr over 60 Minutes Intravenous  Once 07/25/20 0059 07/25/20 0736   07/25/20 0100  vancomycin (VANCOCIN) IVPB 1000 mg/200 mL premix        1,000 mg 200 mL/hr over 60 Minutes Intravenous  Once 07/25/20 0059 07/25/20 0208   07/24/20 2245  clindamycin (CLEOCIN) IVPB 600 mg        600 mg 100 mL/hr over 30 Minutes Intravenous  Once 07/24/20 2244 07/24/20 2341   07/24/20 1915  vancomycin (VANCOCIN)  IVPB 1000 mg/200 mL premix        1,000 mg 200 mL/hr over 60 Minutes Intravenous  Once 07/24/20 1914 07/24/20 2037        OBJECTIVE: Blood pressure (!) 115/99, pulse 70, temperature 98.4 F (36.9 C), temperature source Oral, resp. rate 17, height 5\' 6"  (1.676 m), weight (!) 144.3 kg, SpO2 97 %.  Physical Exam Vitals reviewed.  Constitutional:      Appearance: Normal appearance. He is obese.  HENT:     Head:     Jaw: Swelling present.      Mouth/Throat:     Mouth: Mucous membranes are moist.     Pharynx: No oropharyngeal exudate.   Eyes:     General: No scleral icterus.    Extraocular Movements: Extraocular movements intact.     Pupils: Pupils are equal, round, and reactive to light.  Cardiovascular:     Rate and Rhythm: Normal rate and regular rhythm.  Pulmonary:     Effort: Pulmonary effort is normal.     Breath sounds: Normal breath sounds.  Abdominal:     General: Bowel sounds are normal. There is no distension.     Palpations: Abdomen is soft.     Tenderness: There is no abdominal tenderness.    Musculoskeletal:     Cervical back: Normal range of motion and neck supple.     Right lower leg: No edema.     Left lower leg: No edema.  Neurological:     General: No focal deficit present.     Mental Status: He is alert.  Psychiatric:        Mood and Affect: Mood normal.   please see images for abd wall wound  Lab Results Results for orders placed or performed during the hospital encounter of 07/24/20 (from the past 48 hour(s))  Lactic acid, plasma     Status: None   Collection Time: 07/24/20  6:36 PM  Result Value Ref Range   Lactic Acid, Venous 1.4 0.5 - 1.9 mmol/L    Comment: Performed at [facility], [address] [place], [place] [address]  Comprehensive metabolic panel     Status: Abnormal   Collection Time: 07/24/20  6:36 PM  Result Value Ref Range   Sodium 135  135 - 145 mmol/L   Potassium 3.5 3.5 - 5.1 mmol/L   Chloride 100 98 - 111  mmol/L   CO2 24 22 - 32 mmol/L   Glucose, Bld 137 (H) 70 - 99 mg/dL    Comment: Glucose reference range applies only to samples taken after fasting for at least 8 hours.   BUN 13 6 - 20 mg/dL   Creatinine, Ser 8.75 0.61 - 1.24 mg/dL   Calcium 9.4 8.9 - 64.3 mg/dL   Total Protein 9.5 (H) 6.5 - 8.1 g/dL   Albumin 4.2 3.5 - 5.0 g/dL   AST 20 15 - 41 U/L   ALT 16 0 - 44 U/L   Alkaline Phosphatase 112 38 - 126 U/L   Total Bilirubin 0.3 0.3 - 1.2 mg/dL   GFR calc non Af Amer >60 >60 mL/min   GFR calc Af Amer >60 >60 mL/min   Anion gap 11 5 - 15    Comment: Performed at [facility], [address] [place], [place] [address]  CBC with Differential     Status: Abnormal   Collection Time: 07/24/20  6:36 PM  Result Value Ref Range   WBC 12.9 (H) 4.0 - 10.5 K/uL   RBC 5.08 4.22 - 5.81 MIL/uL   Hemoglobin 12.3 (L) 13.0 - 17.0 g/dL   HCT 88.4 (L) 39 - 52 %   MCV 76.4 (L) 80.0 - 100.0 fL   MCH 24.2 (L) 26.0 - 34.0 pg   MCHC 31.7 30.0 - 36.0 g/dL   RDW 16.6 (H) 06.3 - 01.6 %   Platelets 465 (H) 150 - 400 K/uL   nRBC 0.0 0.0 - 0.2 %   Neutrophils Relative % 78 %   Neutro Abs 10.0 (H) 1.7 - 7.7 K/uL   Lymphocytes Relative 15 %   Lymphs Abs 1.9 0.7 - 4.0 K/uL   Monocytes Relative 5 %   Monocytes Absolute 0.7 0 - 1 K/uL   Eosinophils Relative 1 %   Eosinophils Absolute 0.1 0 - 0 K/uL   Basophils Relative 0 %   Basophils Absolute 0.1 0 - 0 K/uL   Immature Granulocytes 1 %   Abs Immature Granulocytes 0.13 (H) 0.00 - 0.07 K/uL    Comment: Performed at [facility], [address] [place], [place] [address]  Culture, blood (routine x 2)     Status: Abnormal (Preliminary result)   Collection Time: 07/24/20  6:37 PM   Specimen: BLOOD RIGHT ARM  Result Value Ref Range   Specimen Description      BLOOD RIGHT ARM Performed at [facility], [address] [place], [place] [address]    Special Requests      BOTTLES DRAWN AEROBIC AND ANAEROBIC Blood Culture  adequate volume Performed at [facility], [address] [place], [place] [address]    Culture  Setup Time      GRAM POSITIVE COCCI IN CLUSTERS ANAEROBIC BOTTLE ONLY Organism ID to follow CRITICAL RESULT CALLED TO, READ BACK BY AND VERIFIED WITHCandiss [facility] 2051 07/25/20 A BROWNING Performed at [facility], [address], [place], [place] [address]    Culture STAPHYLOCOCCUS AUREUS (A)    Report Status PENDING   Blood Culture ID Panel (Reflexed)     Status: Abnormal   Collection Time: 07/24/20  6:37 PM  Result Value Ref Range   Enterococcus faecalis NOT DETECTED NOT DETECTED   Enterococcus  Faecium NOT DETECTED NOT DETECTED   Listeria monocytogenes NOT DETECTED NOT DETECTED   Staphylococcus species DETECTED (A) NOT DETECTED    Comment: CRITICAL RESULT CALLED TO, READ BACK BY AND VERIFIED WITH: H VON [facility] PHARMD 2051 07/25/20 A BROWNING    Staphylococcus aureus (BCID) DETECTED (A) NOT DETECTED    Comment: CRITICAL RESULT CALLED TO, READ BACK BY AND VERIFIED WITH: H VON [facility] PHARMD 2051 07/25/20 A BROWNING    Staphylococcus epidermidis NOT DETECTED NOT DETECTED   Staphylococcus lugdunensis NOT DETECTED NOT DETECTED   Streptococcus species NOT DETECTED NOT DETECTED   Streptococcus agalactiae NOT DETECTED NOT DETECTED   Streptococcus pneumoniae NOT DETECTED NOT DETECTED   Streptococcus pyogenes NOT DETECTED NOT DETECTED   A.calcoaceticus-baumannii NOT DETECTED NOT DETECTED   Bacteroides fragilis NOT DETECTED NOT DETECTED   Enterobacterales NOT DETECTED NOT DETECTED   Enterobacter cloacae complex NOT DETECTED NOT DETECTED   Escherichia coli NOT DETECTED NOT DETECTED   Klebsiella aerogenes NOT DETECTED NOT DETECTED   Klebsiella oxytoca NOT DETECTED NOT DETECTED   Klebsiella pneumoniae NOT DETECTED NOT DETECTED   Proteus species NOT DETECTED NOT DETECTED   Salmonella species NOT DETECTED NOT DETECTED   Serratia marcescens NOT DETECTED NOT DETECTED    Haemophilus influenzae NOT DETECTED NOT DETECTED   Neisseria meningitidis NOT DETECTED NOT DETECTED   Pseudomonas aeruginosa NOT DETECTED NOT DETECTED   Stenotrophomonas maltophilia NOT DETECTED NOT DETECTED   Candida albicans NOT DETECTED NOT DETECTED   Candida auris NOT DETECTED NOT DETECTED   Candida glabrata NOT DETECTED NOT DETECTED   Candida krusei NOT DETECTED NOT DETECTED   Candida parapsilosis NOT DETECTED NOT DETECTED   Candida tropicalis NOT DETECTED NOT DETECTED   Cryptococcus neoformans/gattii NOT DETECTED NOT DETECTED   Meth resistant mecA/C and MREJ NOT DETECTED NOT DETECTED    Comment: Performed at [facility], [address] [place], [place] [address]  Urinalysis, Routine w reflex microscopic Urine, Clean Catch     Status: Abnormal   Collection Time: 07/24/20  7:28 PM  Result Value Ref Range   Color, Urine YELLOW YELLOW   APPearance CLOUDY (A) CLEAR   Specific Gravity, Urine >1.030 (H) 1.005 - 1.030   pH 6.0 5.0 - 8.0   Glucose, UA NEGATIVE NEGATIVE mg/dL   Hgb urine dipstick NEGATIVE NEGATIVE   Bilirubin Urine SMALL (A) NEGATIVE   Ketones, ur NEGATIVE NEGATIVE mg/dL   Protein, ur 30 (A) NEGATIVE mg/dL   Nitrite NEGATIVE NEGATIVE   Leukocytes,Ua NEGATIVE NEGATIVE    Comment: Performed at [facility], [address] [place], [place] [address]  SARS Coronavirus 2 by RT PCR (hospital order, performed in [facility] hospital lab) Nasopharyngeal Peripheral     Status: None   Collection Time: 07/24/20  7:28 PM   Specimen: Peripheral; Nasopharyngeal  Result Value Ref Range   SARS Coronavirus 2 NEGATIVE NEGATIVE    Comment: (NOTE) SARS-CoV-2 target nucleic acids are NOT DETECTED.  The SARS-CoV-2 RNA is generally detectable in upper and lower respiratory specimens during the acute phase of infection. The lowest concentration of SARS-CoV-2 viral copies this assay can detect is 250 copies / mL. A negative result does not preclude SARS-CoV-2  infection and should not be used as the sole basis for treatment or other patient management decisions.  A negative result may occur with improper specimen collection / handling, submission of specimen other than nasopharyngeal swab, presence of viral mutation(s) within the areas targeted by this assay, and  inadequate number of viral copies (<250 copies / mL). A negative result must be combined with clinical observations, patient history, and epidemiological information.  Fact Sheet for Patients:   BoilerBrush.com.cy  Fact Sheet for Healthcare Providers: https://pope.com/  This test is not yet approved or  cleared by the [place] FDA and has been authorized for detection and/or diagnosis of SARS-CoV-2 by FDA under an Emergency Use Authorization (EUA).  This EUA will remain in effect (meaning this test can be used) for the duration of the COVID-19 declaration under Section 564(b)(1) of the Act, 21 U.S.C. section 360bbb-3(b)(1), unless the authorization is terminated or revoked sooner.  Performed at [facility], [address] [place], [place] [address]   Urinalysis, Microscopic (reflex)     Status: Abnormal   Collection Time: 07/24/20  7:28 PM  Result Value Ref Range   RBC / HPF 0-5 0 - 5 RBC/hpf   WBC, UA 0-5 0 - 5 WBC/hpf   Bacteria, UA RARE (A) NONE SEEN   Squamous Epithelial / LPF 6-10 0 - 5   Mucus PRESENT     Comment: Performed at [facility], [address] [place], [place] [address]  MRSA PCR Screening     Status: Abnormal   Collection Time: 07/25/20  4:21 AM   Specimen: Nasopharyngeal  Result Value Ref Range   MRSA by PCR POSITIVE (A) NEGATIVE    Comment:        The GeneXpert MRSA Assay (FDA approved for NASAL specimens only), is one component of a comprehensive MRSA colonization surveillance program. It is not intended to diagnose MRSA infection nor to guide or monitor treatment  for MRSA infections. RESULT CALLED TO, READ BACK BY AND VERIFIED WITH: RN Jasmine December [id number] [phone] FCP Performed at [facility], [address] [place], [place] [address]   Urine rapid drug screen (hosp performed)     Status: Abnormal   Collection Time: 07/25/20  5:00 AM  Result Value Ref Range   Opiates POSITIVE (A) NONE DETECTED   Cocaine POSITIVE (A) NONE DETECTED   Benzodiazepines NONE DETECTED NONE DETECTED   Amphetamines NONE DETECTED NONE DETECTED   Tetrahydrocannabinol NONE DETECTED NONE DETECTED   Barbiturates NONE DETECTED NONE DETECTED    Comment: (NOTE) DRUG SCREEN FOR MEDICAL PURPOSES ONLY.  IF CONFIRMATION IS NEEDED FOR ANY PURPOSE, NOTIFY LAB WITHIN 5 DAYS.  LOWEST DETECTABLE LIMITS FOR URINE DRUG SCREEN Drug Class                     Cutoff (ng/mL) Amphetamine and metabolites    1000 Barbiturate and metabolites    200 Benzodiazepine                 200 Tricyclics and metabolites     300 Opiates and metabolites        300 Cocaine and metabolites        300 THC                            50 Performed at [facility], [address], [place], [place] [address]   CBC with Differential/Platelet     Status: Abnormal   Collection Time: 07/25/20  2:03 PM  Result Value Ref Range   WBC 9.8 4.0 - 10.5 K/uL   RBC 4.50 4.22 - 5.81 MIL/uL   Hemoglobin 11.0 (L) 13.0 - 17.0 g/dL  HCT 34.9 (L) 39 - 52 %   MCV 77.6 (L) 80.0 - 100.0 fL   MCH 24.4 (L) 26.0 - 34.0 pg   MCHC 31.5 30.0 - 36.0 g/dL   RDW 13.2 (H) 44.0 - 10.2 %   Platelets 406 (H) 150 - 400 K/uL   nRBC 0.0 0.0 - 0.2 %   Neutrophils Relative % 66 %   Neutro Abs 6.6 1.7 - 7.7 K/uL   Lymphocytes Relative 21 %   Lymphs Abs 2.0 0.7 - 4.0 K/uL   Monocytes Relative 9 %   Monocytes Absolute 0.8 0 - 1 K/uL   Eosinophils Relative 2 %   Eosinophils Absolute 0.2 0 - 0 K/uL   Basophils Relative 1 %   Basophils Absolute 0.1 0 - 0 K/uL   Immature Granulocytes 1 %   Abs Immature Granulocytes 0.12  (H) 0.00 - 0.07 K/uL    Comment: Performed at [facility], [address], [place], [place] [address]  Comprehensive metabolic panel     Status: Abnormal   Collection Time: 07/25/20  2:03 PM  Result Value Ref Range   Sodium 136 135 - 145 mmol/L   Potassium 3.9 3.5 - 5.1 mmol/L   Chloride 101 98 - 111 mmol/L   CO2 25 22 - 32 mmol/L   Glucose, Bld 101 (H) 70 - 99 mg/dL    Comment: Glucose reference range applies only to samples taken after fasting for at least 8 hours.   BUN 8 6 - 20 mg/dL   Creatinine, Ser 6.44 0.61 - 1.24 mg/dL   Calcium 9.2 8.9 - 03.4 mg/dL   Total Protein 8.1 6.5 - 8.1 g/dL   Albumin 3.5 3.5 - 5.0 g/dL   AST 16 15 - 41 U/L   ALT 14 0 - 44 U/L   Alkaline Phosphatase 109 38 - 126 U/L   Total Bilirubin 0.8 0.3 - 1.2 mg/dL   GFR calc non Af Amer >60 >60 mL/min   GFR calc Af Amer >60 >60 mL/min   Anion gap 10 5 - 15    Comment: Performed at [facility], [address], [place], [place] [address]      Component Value Date/Time   SDES  07/24/2020 1837    BLOOD RIGHT ARM Performed at [facility], [address] [place], [place] [address]    [facility]  07/24/2020 1837    BOTTLES DRAWN AEROBIC AND ANAEROBIC Blood Culture adequate volume Performed at [facility], [address] [place], [place] [address]    CULT STAPHYLOCOCCUS AUREUS (A) 07/24/2020 1837   REPTSTATUS PENDING 07/24/2020 1837   DG Chest 2 View  Result Date: 07/24/2020 CLINICAL DATA:  Suspected sepsis. Right facial swelling since yesterday. Chest pressure. EXAM: CHEST - 2 VIEW COMPARISON:  Most recent radiograph 07/29/2019. Most recent CT 03/05/2020 FINDINGS: Stable heart size and mediastinal contours. Borderline cardiomegaly. No focal airspace disease. No pulmonary edema. No pleural effusion or pneumothorax no evidence of acute osseous abnormality. IMPRESSION: No acute findings. Borderline cardiomegaly. Electronically Signed   By: Narda Rutherford M.D.   On:  07/24/2020 18:50   DG Forearm Right  Result Date: 07/24/2020 CLINICAL DATA:  Abscess.  Concern for foreign body. EXAM: RIGHT FOREARM - 2 VIEW COMPARISON:  None. FINDINGS: There is extensive soft tissue swelling about the forearm without evidence for an acute displaced fracture or dislocation. There is no radiographic evidence for osteomyelitis. There are no definite pockets of  subcutaneous gas. There is no radiopaque foreign body. IMPRESSION: Extensive soft tissue swelling about the forearm without evidence for an acute displaced fracture or dislocation. No radiopaque foreign body. No radiographic evidence for osteomyelitis. Electronically Signed   By: Katherine Mantle M.D.   On: 07/24/2020 19:44   CT ABDOMEN PELVIS W CONTRAST  Result Date: 07/24/2020 CLINICAL DATA:  Abdominal abscess. Abdominal wall abscess. History of incompletely treated lumbar infection. Patient reports back and hip pain. EXAM: CT ABDOMEN AND PELVIS WITH CONTRAST TECHNIQUE: Multidetector CT imaging of the abdomen and pelvis was performed using the standard protocol following bolus administration of intravenous contrast. CONTRAST:  OMNIPAQUE IOHEXOL 300 MG/ML  SOLN COMPARISON:  Most recent abdominal CT 07/29/2019 FINDINGS: Lower chest: No basilar consolidation or pleural fluid. Hepatobiliary: Enlarged liver spanning 22.6 cm cranial caudal. Diffuse steatosis. No focal hepatic abnormality. Gallbladder unremarkable. No biliary dilatation. Pancreas: No ductal dilatation or inflammation. Spleen: Upper normal in size spanning 12.6 cm. No focal abnormality. Adrenals/Urinary Tract: Normal adrenal glands. No hydronephrosis or perinephric edema. Homogeneous renal enhancement with symmetric excretion on delayed phase imaging. Urinary bladder is minimally distended without wall thickening. Stomach/Bowel: Fluid distended stomach without gastric wall thickening. There is no small bowel obstruction or inflammatory change. Normal appendix.  Scattered colonic diverticula without diverticulitis. No colonic wall thickening. Vascular/Lymphatic: Mild aortic atherosclerosis, advanced for age. No aortic aneurysm. Patent portal vein. Prominent periportal nodes are likely reactive. Reproductive: Prostatic calcifications Other: There is skin thickening with ill-defined edema involving the right lower anterior abdominal wall. Ill-defined fluid/edema spans approximately 3.3 x 2.0 cm in transverse by AP dimension and 2.4 cm in depth. There is no peripherally enhancing or well-circumscribed collection. No soft tissue air. No additional abscess in the abdomen or pelvis. Small fat containing umbilical hernia. Mild fat in the inguinal canals. Musculoskeletal: Previous erosive changes involving the right L4-L5 facet have healed. There is no evidence of discitis or vertebral osteomyelitis. 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 No definite acute bony destruction by CT. No evidence of intramuscular collection. IMPRESSION: 1. Skin thickening with ill-defined edema involving the right lower anterior abdominal wall consistent with cellulitis. Ill-defined fluid/edema spans approximately 3.3 x 2.0 x 2.4 cm. No peripherally enhancing or well-circumscribed collection. No soft tissue air. 2. No other findings of infection in the abdomen or pelvis. No CT findings of acute spinal infection. 3. Hepatomegaly and hepatic steatosis. 4. Colonic diverticulosis without diverticulitis. 5. 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 No definite acute bony destruction by CT. Aortic Atherosclerosis (ICD10-I70.0). Electronically Signed   By: Narda Rutherford M.D.   On: 07/24/2020 21:10   CT Maxillofacial W Contrast  Result Date: 07/24/2020 CLINICAL DATA:  44 year old male with right facial swelling since yesterday. Bed tooth. History of  staph infection. EXAM: CT MAXILLOFACIAL WITH CONTRAST TECHNIQUE: Multidetector CT imaging of the maxillofacial structures was performed with intravenous contrast. Multiplanar CT image reconstructions were also generated. CONTRAST:  OMNIPAQUE IOHEXOL 300 MG/ML  SOLN COMPARISON:  Face CT 07/29/2018. FINDINGS: Osseous: Absent posterior dentition bilaterally with carious residual bilateral maxillary and mandible dentition. See soft tissue details below. Intact mandible. Other facial bones, central skull base, visible calvarium, an cervical vertebrae appear intact. Chronic degenerative changes about the odontoid. Orbits: Intact orbital walls. Disconjugate gaze but otherwise negative orbits soft tissues. Sinuses: Clear. Tympanic cavities and mastoids are clear. Soft tissues: Negative visible thyroid,  larynx, pharynx, parapharyngeal spaces, sublingual space, submandibular spaces, parotid and masticator spaces. Negative retropharyngeal space aside from a retropharyngeal course of both carotids. Soft tissue swelling and stranding along the right anterior face overlying the buccal space. And along the right anterior maxillary alveolar process there is suggestion of a tiny subperiosteal abscess (non drainable series 3, image 36) in proximity to carious right maxillary probable canine tooth with some periapical lucency. No soft tissue gas. No other soft tissue fluid collection. Mild reactive appearing right level 1 and level 2 lymph nodes. Limited intracranial: The major vascular structures in the neck and at the skull base are patent. Partially retropharyngeal course of both carotid arteries, tortuous right ICA below the skull base. Negative visible brain parenchyma. IMPRESSION: 1. Right facial cellulitis, probably odontogenic with infection source suspected to be the carious right maxillary canine with a subtle/trace subperiosteal abscess suspected near the root of that tooth. 2. Carious dentition elsewhere. Mild  reactive right level 1 and level 2 lymph nodes. Electronically Signed   By: Odessa FlemingH  Hall M.D.   On: 07/24/2020 21:12   VAS US UPPER EXTREMITY VENOUS DUPLEX  Result Date: 07/25/2020 UPPER VENOUS STUDY  Indications: Swelling, and Edema Risk Factors: IV Drug Use. Performing Technologist: Jannet AskewVernon Matacale RCT RDMS  Examination Guidelines: A complete evaluation includes B-mode imaging, spectral Doppler, color Doppler, and power Doppler as needed of all accessible portions of each vessel. Bilateral testing is considered an integral part of a complete examination. Limited examinations for reoccurring indications may be performed as noted.  Right Findings: +----------+------------+---------+-----------+----------+-------+  RIGHT      Compressible Phasicity Spontaneous Properties Summary  +----------+------------+---------+-----------+----------+-------+  IJV            Full        Yes        Yes                         +----------+------------+---------+-----------+----------+-------+  Subclavian     Full        Yes        Yes                         +----------+------------+---------+-----------+----------+-------+  Axillary       Full        Yes        Yes                         +----------+------------+---------+-----------+----------+-------+  Brachial       Full        Yes        Yes                         +----------+------------+---------+-----------+----------+-------+  Radial         Full                                               +----------+------------+---------+-----------+----------+-------+  Ulnar          Full                                               +----------+------------+---------+-----------+----------+-------+  Cephalic       Full                                               +----------+------------+---------+-----------+----------+-------+  Basilic        Full                                               +----------+------------+---------+-----------+----------+-------+  Left Findings:  +----------+------------+---------+-----------+----------+-------+  LEFT       Compressible Phasicity Spontaneous Properties Summary  +----------+------------+---------+-----------+----------+-------+  IJV            Full        Yes        Yes                         +----------+------------+---------+-----------+----------+-------+  Subclavian     Full        Yes        Yes                         +----------+------------+---------+-----------+----------+-------+  Summary:  Right: No evidence of deep vein thrombosis in the upper extremity. No evidence of superficial vein thrombosis in the upper extremity.  Left: No evidence of thrombosis in the subclavian.  *See table(s) above for measurements and observations.  Diagnosing physician: Sherald Hess MD Electronically signed by Sherald Hess MD on 07/25/2020 at 4:42:55 PM.    Final    Recent Results (from the past 240 hour(s))  Culture, blood (routine x 2)     Status: Abnormal (Preliminary result)   Collection Time: 07/24/20  6:37 PM   Specimen: BLOOD RIGHT ARM  Result Value Ref Range Status   Specimen Description   Final    BLOOD RIGHT ARM Performed at [facility], [address] [place], [place] [address]    Special Requests   Final    BOTTLES DRAWN AEROBIC AND ANAEROBIC Blood Culture adequate volume Performed at [facility], [address] [place], [place] [address]    Culture  Setup Time   Final    GRAM POSITIVE COCCI IN CLUSTERS ANAEROBIC BOTTLE ONLY Organism ID to follow CRITICAL RESULT CALLED TO, READ BACK BY AND VERIFIED WITHCandiss Norse [facility] 2051 07/25/20 A BROWNING Performed at [facility], [address] [place], [place] [address]    Culture STAPHYLOCOCCUS AUREUS (A)  Final   Report Status PENDING  Incomplete  Blood Culture ID Panel (Reflexed)     Status: Abnormal   Collection Time: 07/24/20  6:37 PM  Result Value Ref Range Status   Enterococcus faecalis NOT DETECTED NOT DETECTED Final     Enterococcus Faecium NOT DETECTED NOT DETECTED Final   Listeria monocytogenes NOT DETECTED NOT DETECTED Final   Staphylococcus species DETECTED (A) NOT DETECTED Final    Comment: CRITICAL RESULT CALLED TO, READ BACK BY AND VERIFIED WITHCandiss Norse [facility] PH[facility] 2051 07/25/20 A BROWNING    Staphylococcus aureus (BCID) DETECTED (A) NOT DETECTED Final    Comment: CRITICAL RESULT CALLED TO, READ BACK BY AND VERIFIED WITHCandiss Norse [facility] PHARMD 2051 07/25/20 A BROWNING    Staphylococcus epidermidis NOT DETECTED  NOT DETECTED Final   Staphylococcus lugdunensis NOT DETECTED NOT DETECTED Final   Streptococcus species NOT DETECTED NOT DETECTED Final   Streptococcus agalactiae NOT DETECTED NOT DETECTED Final   Streptococcus pneumoniae NOT DETECTED NOT DETECTED Final   Streptococcus pyogenes NOT DETECTED NOT DETECTED Final   A.calcoaceticus-baumannii NOT DETECTED NOT DETECTED Final   Bacteroides fragilis NOT DETECTED NOT DETECTED Final   Enterobacterales NOT DETECTED NOT DETECTED Final   Enterobacter cloacae complex NOT DETECTED NOT DETECTED Final   Escherichia coli NOT DETECTED NOT DETECTED Final   Klebsiella aerogenes NOT DETECTED NOT DETECTED Final   Klebsiella oxytoca NOT DETECTED NOT DETECTED Final   Klebsiella pneumoniae NOT DETECTED NOT DETECTED Final   Proteus species NOT DETECTED NOT DETECTED Final   Salmonella species NOT DETECTED NOT DETECTED Final   Serratia marcescens NOT DETECTED NOT DETECTED Final   Haemophilus influenzae NOT DETECTED NOT DETECTED Final   Neisseria meningitidis NOT DETECTED NOT DETECTED Final   Pseudomonas aeruginosa NOT DETECTED NOT DETECTED Final   Stenotrophomonas maltophilia NOT DETECTED NOT DETECTED Final   Candida albicans NOT DETECTED NOT DETECTED Final   Candida auris NOT DETECTED NOT DETECTED Final   Candida glabrata NOT DETECTED NOT DETECTED Final   Candida krusei NOT DETECTED NOT DETECTED Final   Candida parapsilosis NOT DETECTED NOT DETECTED Final    Candida tropicalis NOT DETECTED NOT DETECTED Final   Cryptococcus neoformans/gattii NOT DETECTED NOT DETECTED Final   Meth resistant mecA/C and MREJ NOT DETECTED NOT DETECTED Final    Comment: Performed at [facility], [address] [place], [place] [address]  SARS Coronavirus 2 by RT PCR (hospital order, performed in [facility] hospital lab) Nasopharyngeal Peripheral     Status: None   Collection Time: 07/24/20  7:28 PM   Specimen: Peripheral; Nasopharyngeal  Result Value Ref Range Status   SARS Coronavirus 2 NEGATIVE NEGATIVE Final    Comment: (NOTE) SARS-CoV-2 target nucleic acids are NOT DETECTED.  The SARS-CoV-2 RNA is generally detectable in upper and lower respiratory specimens during the acute phase of infection. The lowest concentration of SARS-CoV-2 viral copies this assay can detect is 250 copies / mL. A negative result does not preclude SARS-CoV-2 infection and should not be used as the sole basis for treatment or other patient management decisions.  A negative result may occur with improper specimen collection / handling, submission of specimen other than nasopharyngeal swab, presence of viral mutation(s) within the areas targeted by this assay, and inadequate number of viral copies (<250 copies / mL). A negative result must be combined with clinical observations, patient history, and epidemiological information.  Fact Sheet for Patients:   BoilerBrush.com.cy  Fact Sheet for Healthcare Providers: https://pope.com/  This test is not yet approved or  cleared by the [place] FDA and has been authorized for detection and/or diagnosis of SARS-CoV-2 by FDA under an Emergency Use Authorization (EUA).  This EUA will remain in effect (meaning this test can be used) for the duration of the COVID-19 declaration under Section 564(b)(1) of the Act, 21 U.S.C. section 360bbb-3(b)(1), unless the authorization is terminated  or revoked sooner.  Performed at [facility], [address], [place], [place] [address]   MRSA PCR Screening     Status: Abnormal   Collection Time: 07/25/20  4:21 AM   Specimen: Nasopharyngeal  Result Value Ref Range Status   MRSA by PCR POSITIVE (A) NEGATIVE Final    Comment:  The GeneXpert MRSA Assay (FDA approved for NASAL specimens only), is one component of a comprehensive MRSA colonization surveillance program. It is not intended to diagnose MRSA infection nor to guide or monitor treatment for MRSA infections. RESULT CALLED TO, READ BACK BY AND VERIFIED WITH: RN Jasmine December [facility] 1610 [phone] FCP Performed at [facility], [address] [place], [place] [address]     Microbiology: Recent Results (from the past 240 hour(s))  Culture, blood (routine x 2)     Status: Abnormal (Preliminary result)   Collection Time: 07/24/20  6:37 PM   Specimen: BLOOD RIGHT ARM  Result Value Ref Range Status   Specimen Description   Final    BLOOD RIGHT ARM Performed at [facility], [address] [place], [place] [address]    Special Requests   Final    BOTTLES DRAWN AEROBIC AND ANAEROBIC Blood Culture adequate volume Performed at [facility], [address] [place], [place] [address]    Culture  Setup Time   Final    GRAM POSITIVE COCCI IN CLUSTERS ANAEROBIC BOTTLE ONLY Organism ID to follow CRITICAL RESULT CALLED TO, READ BACK BY AND VERIFIED WITHCandiss Norse [facility] 2051 07/25/20 A BROWNING Performed at [facility], [address] [place], [place] [address]    Culture STAPHYLOCOCCUS AUREUS (A)  Final   Report Status PENDING  Incomplete  Blood Culture ID Panel (Reflexed)     Status: Abnormal   Collection Time: 07/24/20  6:37 PM  Result Value Ref Range Status   Enterococcus faecalis NOT DETECTED NOT DETECTED Final   Enterococcus Faecium NOT DETECTED NOT DETECTED Final   Listeria monocytogenes NOT DETECTED NOT  DETECTED Final   Staphylococcus species DETECTED (A) NOT DETECTED Final    Comment: CRITICAL RESULT CALLED TO, READ BACK BY AND VERIFIED WITH: H VON [facility] PHARMD 2051 07/25/20 A BROWNING    Staphylococcus aureus (BCID) DETECTED (A) NOT DETECTED Final    Comment: CRITICAL RESULT CALLED TO, READ BACK BY AND VERIFIED WITH: H [facility]ON [facility] PHARMD 2051 07/25/20 A BROWNING    Staphylococcus epidermidis NOT DETECTED NOT DETECTED Final   Staphylococcus lugdunensis NOT DETECTED NOT DETECTED Final   Streptococcus species NOT DETECTED NOT DETECTED Final   Streptococcus agalactiae NOT DETECTED NOT DETECTED Final   Streptococcus pneumoniae NOT DETECTED NOT DETECTED Final   Streptococcus pyogenes NOT DETECTED NOT DETECTED Final   A.calcoaceticus-baumannii NOT DETECTED NOT DETECTED Final   Bacteroides fragilis NOT DETECTED NOT DETECTED Final   Enterobacterales NOT DETECTED NOT DETECTED Final   Enterobacter cloacae complex NOT DETECTED NOT DETECTED Final   Escherichia coli NOT DETECTED NOT DETECTED Final   Klebsiella aerogenes NOT DETECTED NOT DETECTED Final   Klebsiella oxytoca NOT DETECTED NOT DETECTED Final   Klebsiella pneumoniae NOT DETECTED NOT DETECTED Final   Proteus species NOT DETECTED NOT DETECTED Final   Salmonella species NOT DETECTED NOT DETECTED Final   Serratia marcescens NOT DETECTED NOT DETECTED Final   Haemophilus influenzae NOT DETECTED NOT DETECTED Final   Neisseria meningitidis NOT DETECTED NOT DETECTED Final   Pseudomonas aeruginosa NOT DETECTED NOT DETECTED Final   Stenotrophomonas maltophilia NOT DETECTED NOT DETECTED Final   Candida albicans NOT DETECTED NOT DETECTED Final   Candida auris NOT DETECTED NOT DETECTED Final   Candida glabrata NOT DETECTED NOT DETECTED Final   Candida krusei NOT DETECTED NOT DETECTED Final   Candida parapsilosis NOT DETECTED NOT DETECTED Final   Candida tropicalis  NOT DETECTED NOT DETECTED Final   Cryptococcus neoformans/gattii NOT DETECTED NOT  DETECTED Final   Meth resistant mecA/C and MREJ NOT DETECTED NOT DETECTED Final    Comment: Performed at [facility], [address] [place], [place] [address]  SARS Coronavirus 2 by RT PCR (hospital order, performed in [facility] hospital lab) Nasopharyngeal Peripheral     Status: None   Collection Time: 07/24/20  7:28 PM   Specimen: Peripheral; Nasopharyngeal  Result Value Ref Range Status   SARS Coronavirus 2 NEGATIVE NEGATIVE Final    Comment: (NOTE) SARS-CoV-2 target nucleic acids are NOT DETECTED.  The SARS-CoV-2 RNA is generally detectable in upper and lower respiratory specimens during the acute phase of infection. The lowest concentration of SARS-CoV-2 viral copies this assay can detect is 250 copies / mL. A negative result does not preclude SARS-CoV-2 infection and should not be used as the sole basis for treatment or other patient management decisions.  A negative result may occur with improper specimen collection / handling, submission of specimen other than nasopharyngeal swab, presence of viral mutation(s) within the areas targeted by this assay, and inadequate number of viral copies (<250 copies / mL). A negative result must be combined with clinical observations, patient history, and epidemiological information.  Fact Sheet for Patients:   BoilerBrush.com.cy  Fact Sheet for Healthcare Providers: https://pope.com/  This test is not yet approved or  cleared by the [place] FDA and has been authorized for detection and/or diagnosis of SARS-CoV-2 by FDA under an Emergency Use Authorization (EUA).  This EUA will remain in effect (meaning this test can be used) for the duration of the COVID-19 declaration under Section 564(b)(1) of the Act, 21 U.S.C. section 360bbb-3(b)(1), unless the authorization is terminated or revoked sooner.  Performed at [facility], [address] [place], [place]  [address]   MRSA PCR Screening     Status: Abnormal   Collection Time: 07/25/20  4:21 AM   Specimen: Nasopharyngeal  Result Value Ref Range Status   MRSA by PCR POSITIVE (A) NEGATIVE Final    Comment:        The GeneXpert MRSA Assay (FDA approved for NASAL specimens only), is one component of a comprehensive MRSA colonization surveillance program. It is not intended to diagnose MRSA infection nor to guide or monitor treatment for MRSA infections. RESULT CALLED TO, READ BACK BY AND VERIFIED WITH: RN Jasmine December [facility] 8295 [phone] FCP Performed at [facility], [address] [place], [place] [address]     Radiographs and labs were personally reviewed by me.   Johny Sax, MD [facility] [phone] 07/26/2020, 12:44 PM

## 2020-07-26 NOTE — Progress Notes (Signed)
Patient ID: Joshua Jacobs, male   DOB: Apr 04, 1976, 45 y.o.   MRN: [id number] We are asked to see the patient in regards to his upper extremity predicament.  He has no signs of compartment syndrome dystrophy or advancing infection.  There is no cellulitic change or other issues.  He is certainly had a significant battle with IV drug abuse and other issues but at present time he is fortunately not actively infected or displaying signs of any surgical lesion.  We will be happy to reevaluate should any issues or changes occur otherwise we will sign off and see as needed.  I did discuss this with patient at bedside at great length today.  Chaunte Hornbeck MD

## 2020-07-26 NOTE — H&P (View-Only) (Signed)
°   ° ° ° ° °[facility]   ° °Date of Admission:  07/24/2020   Total days of antibiotics: 2 vanco °        °      °Reason for Consult: Staph aureus bacteremia    °Referring Provider: CHAMP! ° ° °Assessment: °Staph bacteremia °IVDA °Cellulitis °Dental space infection °Hepatitis C (immune?) ° °Plan: °1. 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 His BCID panel did not flag MRSA °3. Restart clinda for dental infection °4. Chlorhexidine oral washes °5. Ask dental to f/u °6. Check Hep C geno, VL °7. Repeat Hep B and A serologies- vaccinate if (-) °8. Check HIV serology °9. Check TTE °10. Repeat BCx in AM °11. MRI spine and hip ° °Thank you so much for this interesting consult, ° °Active Problems: °  Cellulitis ° ° °• amLODipine  5 mg Oral Daily  °• Chlorhexidine Gluconate Cloth  6 each Topical Q0600  °• FLUoxetine  20 mg Oral Daily  °• gabapentin  900 mg Oral BID  °• hydrOXYzine  100 mg Oral TID  °• LORazepam  1 mg Oral QHS  °• mupirocin ointment  1 application Nasal BID  ° ° °HPI: Joshua Jacobs is a 43 y.o. male with hx of Hep C , IVDA, comes to ED on 9-9 with IVDA relapse, facial cellulitis/dental infection and R lower abd wall cellulitis.  °He had BCx done showing 1/2 MSSA.  °He has been afebrile. WBC 12.9.  ° °Denies that his abd wound is from injection site (only injects in his arms).  ° °States he was ab+ for Hep C but never treated.  ° °His hx is also notable for back pain and R hip pain for last 18 months after previous infection managed at [facility].  ° °States he has not been able to go to dentist due to inability to pay.  ° °Review of Systems: °Review of Systems  °Constitutional: Negative for fever and weight loss.  °Gastrointestinal: Negative for constipation and diarrhea.  °Genitourinary: Negative for dysuria.  °Please see HPI. All other systems reviewed and negative. ° ° °Past Medical History:  °Diagnosis Date  °• Anxiety   °• Chronic hip pain   °• Degenerative joint  disease   °• Degenerative joint disease of left hip   °• Hemorrhoids   °• History of MSSA bacteremia 08/2017  °• Hypertension   °• IV drug abuse (HCC)   °• Opioid abuse (HCC)   °• Polysubstance abuse (HCC)   °• PTSD (post-traumatic stress disorder)   °• Sweating abnormality   ° ° °Social History  ° °Tobacco Use  °• Smoking status: Current Every Day Smoker  °  Packs/day: 1.00  °  Years: 21.00  °  Pack years: 21.00  °  Types: Cigarettes  °  Last attempt to quit: 08/19/2017  °  Years since quitting: 2.9  °• Smokeless tobacco: Never Used  °Vaping Use  °• Vaping Use: Never used  °Substance Use Topics  °• Alcohol use: No  °• Drug use: Yes  °  Types: Heroin, Cocaine, Methaqualone, IV  °  Comment: reports no drug use IV in 6 months - 07/29/2019  ° ° °Family History  °Problem Relation Age of Onset  °• Hypertension Mother   °• Hypertension Father   °• Lung cancer Maternal Uncle   ° ° ° °Medications:  °Scheduled: °• amLODipine  5 mg Oral Daily  °• Chlorhexidine Gluconate Cloth  6   each Topical Q0600  °• FLUoxetine  20 mg Oral Daily  °• gabapentin  900 mg Oral BID  °• hydrOXYzine  100 mg Oral TID  °• LORazepam  1 mg Oral QHS  °• mupirocin ointment  1 application Nasal BID  ° ° °Abtx:  °Anti-infectives (From admission, onward)  ° Start     Dose/Rate Route Frequency Ordered Stop  ° 07/25/20 1000  vancomycin (VANCOREADY) IVPB 1500 mg/300 mL       ° 1,500 mg °150 mL/hr over 120 Minutes Intravenous Every 12 hours 07/25/20 0736    ° 07/25/20 0200  vancomycin (VANCOCIN) IVPB 1000 mg/200 mL premix  Status:  Discontinued       ° 1,000 mg °200 mL/hr over 60 Minutes Intravenous  Once 07/25/20 0059 07/25/20 0736  ° 07/25/20 0100  vancomycin (VANCOCIN) IVPB 1000 mg/200 mL premix       ° 1,000 mg °200 mL/hr over 60 Minutes Intravenous  Once 07/25/20 0059 07/25/20 0208  ° 07/24/20 2245  clindamycin (CLEOCIN) IVPB 600 mg       ° 600 mg °100 mL/hr over 30 Minutes Intravenous  Once 07/24/20 2244 07/24/20 2341  ° 07/24/20 1915  vancomycin (VANCOCIN)  IVPB 1000 mg/200 mL premix       ° 1,000 mg °200 mL/hr over 60 Minutes Intravenous  Once 07/24/20 1914 07/24/20 2037  °  ° ° ° ° °OBJECTIVE: °Blood pressure (!) 115/99, pulse 70, temperature 98.4 °F (36.9 °C), temperature source Oral, resp. rate 17, height 5' 6" (1.676 m), weight (!) 144.3 kg, SpO2 97 %. ° °Physical Exam °Vitals reviewed.  °Constitutional:   °   Appearance: Normal appearance. He is obese.  °HENT:  °   Head:  °   Jaw: Swelling present.  ° °   Mouth/Throat:  °   Mouth: Mucous membranes are moist.  °   Pharynx: No oropharyngeal exudate.  ° °Eyes:  °   General: No scleral icterus. °   Extraocular Movements: Extraocular movements intact.  °   Pupils: Pupils are equal, round, and reactive to light.  °Cardiovascular:  °   Rate and Rhythm: Normal rate and regular rhythm.  °Pulmonary:  °   Effort: Pulmonary effort is normal.  °   Breath sounds: Normal breath sounds.  °Abdominal:  °   General: Bowel sounds are normal. There is no distension.  °   Palpations: Abdomen is soft.  °   Tenderness: There is no abdominal tenderness.  ° ° °Musculoskeletal:  °   Cervical back: Normal range of motion and neck supple.  °   Right lower leg: No edema.  °   Left lower leg: No edema.  °Neurological:  °   General: No focal deficit present.  °   Mental Status: He is alert.  °Psychiatric:     °   Mood and Affect: Mood normal.  ° °please see images for abd wall wound ° °Lab Results °Results for orders placed or performed during the hospital encounter of 07/24/20 (from the past 48 hour(s))  °Lactic acid, plasma     Status: None  ° Collection Time: 07/24/20  6:36 PM  °Result Value Ref Range  ° Lactic Acid, Venous 1.4 0.5 - 1.9 mmol/L  °  Comment: Performed at [facility] [place], [address] [place], [place] [address]  °Comprehensive metabolic panel     Status: Abnormal  ° Collection Time: 07/24/20  6:36 PM  °Result Value Ref Range  ° Sodium 135   135 - 145 mmol/L  ° Potassium 3.5 3.5 - 5.1 mmol/L  ° Chloride 100 98 - 111  mmol/L  ° CO2 24 22 - 32 mmol/L  ° Glucose, Bld 137 (H) 70 - 99 mg/dL  °  Comment: Glucose reference range applies only to samples taken after fasting for at least 8 hours.  ° BUN 13 6 - 20 mg/dL  ° Creatinine, Ser 1.10 0.61 - 1.24 mg/dL  ° Calcium 9.4 8.9 - 10.3 mg/dL  ° Total Protein 9.5 (H) 6.5 - 8.1 g/dL  ° Albumin 4.2 3.5 - 5.0 g/dL  ° AST 20 15 - 41 U/L  ° ALT 16 0 - 44 U/L  ° Alkaline Phosphatase 112 38 - 126 U/L  ° Total Bilirubin 0.3 0.3 - 1.2 mg/dL  ° GFR calc non Af Amer >60 >60 mL/min  ° GFR calc Af Amer >60 >60 mL/min  ° Anion gap 11 5 - 15  °  Comment: Performed at [facility] [place], [address] [place], [place] [address]  °CBC with Differential     Status: Abnormal  ° Collection Time: 07/24/20  6:36 PM  °Result Value Ref Range  ° WBC 12.9 (H) 4.0 - 10.5 K/uL  ° RBC 5.08 4.22 - 5.81 MIL/uL  ° Hemoglobin 12.3 (L) 13.0 - 17.0 g/dL  ° HCT 38.8 (L) 39 - 52 %  ° MCV 76.4 (L) 80.0 - 100.0 fL  ° MCH 24.2 (L) 26.0 - 34.0 pg  ° MCHC 31.7 30.0 - 36.0 g/dL  ° RDW 15.9 (H) 11.5 - 15.5 %  ° Platelets 465 (H) 150 - 400 K/uL  ° nRBC 0.0 0.0 - 0.2 %  ° Neutrophils Relative % 78 %  ° Neutro Abs 10.0 (H) 1.7 - 7.7 K/uL  ° Lymphocytes Relative 15 %  ° Lymphs Abs 1.9 0.7 - 4.0 K/uL  ° Monocytes Relative 5 %  ° Monocytes Absolute 0.7 0 - 1 K/uL  ° Eosinophils Relative 1 %  ° Eosinophils Absolute 0.1 0 - 0 K/uL  ° Basophils Relative 0 %  ° Basophils Absolute 0.1 0 - 0 K/uL  ° Immature Granulocytes 1 %  ° Abs Immature Granulocytes 0.13 (H) 0.00 - 0.07 K/uL  °  Comment: Performed at [facility] [place], [address] [place], [place] [address]  °Culture, blood (routine x 2)     Status: Abnormal (Preliminary result)  ° Collection Time: 07/24/20  6:37 PM  ° Specimen: BLOOD RIGHT ARM  °Result Value Ref Range  ° Specimen Description    °  BLOOD RIGHT ARM °Performed at [facility] [place], [address] [place], [place] [address] °  ° Special Requests    °  BOTTLES DRAWN AEROBIC AND ANAEROBIC Blood Culture  adequate volume °Performed at [facility] [place], [address] [place], [place] [address] °  ° Culture  Setup Time    °  GRAM POSITIVE COCCI IN CLUSTERS °ANAEROBIC BOTTLE ONLY °Organism ID to follow °CRITICAL RESULT CALLED TO, READ BACK BY AND VERIFIED WITH: H VON DOHLEN PHARMD 2051 07/25/20 A BROWNING °Performed at [facility], [address] [place], [place] [address] °  ° Culture STAPHYLOCOCCUS AUREUS (A)   ° Report Status PENDING   °Blood Culture ID Panel (Reflexed)     Status: Abnormal  ° Collection Time: 07/24/20  6:37 PM  °Result Value Ref Range  ° Enterococcus faecalis NOT DETECTED NOT DETECTED  ° Enterococcus   Faecium NOT DETECTED NOT DETECTED  ° Listeria monocytogenes NOT DETECTED NOT DETECTED  ° Staphylococcus species DETECTED (A) NOT DETECTED  °  Comment: CRITICAL RESULT CALLED TO, READ BACK BY AND VERIFIED WITH: °H VON DOHLEN PHARMD 2051 07/25/20 A BROWNING °  ° Staphylococcus aureus (BCID) DETECTED (A) NOT DETECTED  °  Comment: CRITICAL RESULT CALLED TO, READ BACK BY AND VERIFIED WITH: °H VON DOHLEN PHARMD 2051 07/25/20 A BROWNING °  ° Staphylococcus epidermidis NOT DETECTED NOT DETECTED  ° Staphylococcus lugdunensis NOT DETECTED NOT DETECTED  ° Streptococcus species NOT DETECTED NOT DETECTED  ° Streptococcus agalactiae NOT DETECTED NOT DETECTED  ° Streptococcus pneumoniae NOT DETECTED NOT DETECTED  ° Streptococcus pyogenes NOT DETECTED NOT DETECTED  ° A.calcoaceticus-baumannii NOT DETECTED NOT DETECTED  ° Bacteroides fragilis NOT DETECTED NOT DETECTED  ° Enterobacterales NOT DETECTED NOT DETECTED  ° Enterobacter cloacae complex NOT DETECTED NOT DETECTED  ° Escherichia coli NOT DETECTED NOT DETECTED  ° Klebsiella aerogenes NOT DETECTED NOT DETECTED  ° Klebsiella oxytoca NOT DETECTED NOT DETECTED  ° Klebsiella pneumoniae NOT DETECTED NOT DETECTED  ° Proteus species NOT DETECTED NOT DETECTED  ° Salmonella species NOT DETECTED NOT DETECTED  ° Serratia marcescens NOT DETECTED NOT DETECTED  °  Haemophilus influenzae NOT DETECTED NOT DETECTED  ° Neisseria meningitidis NOT DETECTED NOT DETECTED  ° Pseudomonas aeruginosa NOT DETECTED NOT DETECTED  ° Stenotrophomonas maltophilia NOT DETECTED NOT DETECTED  ° Candida albicans NOT DETECTED NOT DETECTED  ° Candida auris NOT DETECTED NOT DETECTED  ° Candida glabrata NOT DETECTED NOT DETECTED  ° Candida krusei NOT DETECTED NOT DETECTED  ° Candida parapsilosis NOT DETECTED NOT DETECTED  ° Candida tropicalis NOT DETECTED NOT DETECTED  ° Cryptococcus neoformans/gattii NOT DETECTED NOT DETECTED  ° Meth resistant mecA/C and MREJ NOT DETECTED NOT DETECTED  °  Comment: Performed at [facility], [address] [place], [place] [address]  °Urinalysis, Routine w reflex microscopic Urine, Clean Catch     Status: Abnormal  ° Collection Time: 07/24/20  7:28 PM  °Result Value Ref Range  ° Color, Urine YELLOW YELLOW  ° APPearance CLOUDY (A) CLEAR  ° Specific Gravity, Urine >1.030 (H) 1.005 - 1.030  ° pH 6.0 5.0 - 8.0  ° Glucose, UA NEGATIVE NEGATIVE mg/dL  ° Hgb urine dipstick NEGATIVE NEGATIVE  ° Bilirubin Urine SMALL (A) NEGATIVE  ° Ketones, ur NEGATIVE NEGATIVE mg/dL  ° Protein, ur 30 (A) NEGATIVE mg/dL  ° Nitrite NEGATIVE NEGATIVE  ° Leukocytes,Ua NEGATIVE NEGATIVE  °  Comment: Performed at [facility] [place], [address] [place], [place] [address]  °SARS Coronavirus 2 by RT PCR (hospital order, performed in [place] hospital lab) Nasopharyngeal Peripheral     Status: None  ° Collection Time: 07/24/20  7:28 PM  ° Specimen: Peripheral; Nasopharyngeal  °Result Value Ref Range  ° SARS Coronavirus 2 NEGATIVE NEGATIVE  °  Comment: (NOTE) °SARS-CoV-2 target nucleic acids are NOT DETECTED. ° °The SARS-CoV-2 RNA is generally detectable in upper and lower °respiratory specimens during the acute phase of infection. The lowest °concentration of SARS-CoV-2 viral copies this assay can detect is 250 °copies / mL. A negative result does not preclude SARS-CoV-2  infection °and should not be used as the sole basis for treatment or other °patient management decisions.  A negative result may occur with °improper specimen collection / handling, submission of specimen other °than nasopharyngeal swab, presence of viral mutation(s) within the °areas targeted by this assay, and   inadequate number of viral copies °(<250 copies / mL). A negative result must be combined with clinical °observations, patient history, and epidemiological information. ° °Fact Sheet for Patients:   °https://www.fda.gov/media/136312/download ° °Fact Sheet for Healthcare Providers: °https://www.fda.gov/media/136313/download ° °This test is not yet approved or  cleared by the [place] FDA and °has been authorized for detection and/or diagnosis of SARS-CoV-2 by °FDA under an Emergency Use Authorization (EUA).  This EUA will remain °in effect (meaning this test can be used) for the duration of the °COVID-19 declaration under Section 564(b)(1) of the Act, 21 U.S.C. °section 360bbb-3(b)(1), unless the authorization is terminated or °revoked sooner. ° °Performed at [facility] [place], [address] [place] °[place], [place] [address] °  °Urinalysis, Microscopic (reflex)     Status: Abnormal  ° Collection Time: 07/24/20  7:28 PM  °Result Value Ref Range  ° RBC / HPF 0-5 0 - 5 RBC/hpf  ° WBC, UA 0-5 0 - 5 WBC/hpf  ° Bacteria, UA RARE (A) NONE SEEN  ° Squamous Epithelial / LPF 6-10 0 - 5  ° Mucus PRESENT   °  Comment: Performed at [facility] [place], [address] [place], [place] [address]  °MRSA PCR Screening     Status: Abnormal  ° Collection Time: 07/25/20  4:21 AM  ° Specimen: Nasopharyngeal  °Result Value Ref Range  ° MRSA by PCR POSITIVE (A) NEGATIVE  °  Comment:        °The GeneXpert MRSA Assay (FDA °approved for NASAL specimens °only), is one component of a °comprehensive MRSA colonization °surveillance program. It is not °intended to diagnose MRSA °infection nor to guide or °monitor treatment  for °MRSA infections. °RESULT CALLED TO, READ BACK BY AND VERIFIED WITH: °RN SHARON WHITEHORN 0724 [id number] FCP °Performed at [facility], [address] [place], [place] [address] °  °Urine rapid drug screen (hosp performed)     Status: Abnormal  ° Collection Time: 07/25/20  5:00 AM  °Result Value Ref Range  ° Opiates POSITIVE (A) NONE DETECTED  ° Cocaine POSITIVE (A) NONE DETECTED  ° Benzodiazepines NONE DETECTED NONE DETECTED  ° Amphetamines NONE DETECTED NONE DETECTED  ° Tetrahydrocannabinol NONE DETECTED NONE DETECTED  ° Barbiturates NONE DETECTED NONE DETECTED  °  Comment: (NOTE) °DRUG SCREEN FOR MEDICAL PURPOSES °ONLY.  IF CONFIRMATION IS NEEDED °FOR ANY PURPOSE, NOTIFY LAB °WITHIN 5 DAYS. ° °LOWEST DETECTABLE LIMITS °FOR URINE DRUG SCREEN °Drug Class                     Cutoff (ng/mL) °Amphetamine and metabolites    1000 °Barbiturate and metabolites    200 °Benzodiazepine                 200 °Tricyclics and metabolites     300 °Opiates and metabolites        300 °Cocaine and metabolites        300 °THC                            50 °Performed at [facility], [address] [place],  °[address] °  °CBC with Differential/Platelet     Status: Abnormal  ° Collection Time: 07/25/20  2:03 PM  °Result Value Ref Range  ° WBC 9.8 4.0 - 10.5 K/uL  ° RBC 4.50 4.22 - 5.81 MIL/uL  ° Hemoglobin 11.0 (L) 13.0 - 17.0 g/dL  °   HCT 34.9 (L) 39 - 52 %  ° MCV 77.6 (L) 80.0 - 100.0 fL  ° MCH 24.4 (L) 26.0 - 34.0 pg  ° MCHC 31.5 30.0 - 36.0 g/dL  ° RDW 15.9 (H) 11.5 - 15.5 %  ° Platelets 406 (H) 150 - 400 K/uL  ° nRBC 0.0 0.0 - 0.2 %  ° Neutrophils Relative % 66 %  ° Neutro Abs 6.6 1.7 - 7.7 K/uL  ° Lymphocytes Relative 21 %  ° Lymphs Abs 2.0 0.7 - 4.0 K/uL  ° Monocytes Relative 9 %  ° Monocytes Absolute 0.8 0 - 1 K/uL  ° Eosinophils Relative 2 %  ° Eosinophils Absolute 0.2 0 - 0 K/uL  ° Basophils Relative 1 %  ° Basophils Absolute 0.1 0 - 0 K/uL  ° Immature Granulocytes 1 %  ° Abs Immature Granulocytes 0.12  (H) 0.00 - 0.07 K/uL  °  Comment: Performed at [facility], [address] [place], [place] [address]  °Comprehensive metabolic panel     Status: Abnormal  ° Collection Time: 07/25/20  2:03 PM  °Result Value Ref Range  ° Sodium 136 135 - 145 mmol/L  ° Potassium 3.9 3.5 - 5.1 mmol/L  ° Chloride 101 98 - 111 mmol/L  ° CO2 25 22 - 32 mmol/L  ° Glucose, Bld 101 (H) 70 - 99 mg/dL  °  Comment: Glucose reference range applies only to samples taken after fasting for at least 8 hours.  ° BUN 8 6 - 20 mg/dL  ° Creatinine, Ser 0.96 0.61 - 1.24 mg/dL  ° Calcium 9.2 8.9 - 10.3 mg/dL  ° Total Protein 8.1 6.5 - 8.1 g/dL  ° Albumin 3.5 3.5 - 5.0 g/dL  ° AST 16 15 - 41 U/L  ° ALT 14 0 - 44 U/L  ° Alkaline Phosphatase 109 38 - 126 U/L  ° Total Bilirubin 0.8 0.3 - 1.2 mg/dL  ° GFR calc non Af Amer >60 >60 mL/min  ° GFR calc Af Amer >60 >60 mL/min  ° Anion gap 10 5 - 15  °  Comment: Performed at  [facility], [address] [place], [place] [address]  ° °   °Component Value Date/Time  ° SDES  07/24/2020 1837  °  BLOOD RIGHT ARM °Performed at [facility] [place], [address] [place], [place] [address] °  ° SPECREQUEST  07/24/2020 1837  °  BOTTLES DRAWN AEROBIC AND ANAEROBIC Blood Culture adequate volume °Performed at [facility] [place], [address] [place], [place] [address] °  ° CULT STAPHYLOCOCCUS AUREUS (A) 07/24/2020 1837  ° REPTSTATUS PENDING 07/24/2020 1837  ° °DG Chest 2 View ° °Result Date: 07/24/2020 °CLINICAL DATA:  Suspected sepsis. Right facial swelling since yesterday. Chest pressure. EXAM: CHEST - 2 VIEW COMPARISON:  Most recent radiograph 07/29/2019. Most recent CT 03/05/2020 FINDINGS: Stable heart size and mediastinal contours. Borderline cardiomegaly. No focal airspace disease. No pulmonary edema. No pleural effusion or pneumothorax no evidence of acute osseous abnormality. IMPRESSION: No acute findings. Borderline cardiomegaly. Electronically Signed   By: Melanie  Sanford M.D.   On:  07/24/2020 18:50  ° °DG Forearm Right ° °Result Date: 07/24/2020 °CLINICAL DATA:  Abscess.  Concern for foreign body. EXAM: RIGHT FOREARM - 2 VIEW COMPARISON:  None. FINDINGS: There is extensive soft tissue swelling about the forearm without evidence for an acute displaced fracture or dislocation. There is no radiographic evidence for osteomyelitis. There are no definite pockets of   subcutaneous gas. There is no radiopaque foreign body. IMPRESSION: Extensive soft tissue swelling about the forearm without evidence for an acute displaced fracture or dislocation. No radiopaque foreign body. No radiographic evidence for osteomyelitis. Electronically Signed   By: Christopher  Green M.D.   On: 07/24/2020 19:44  ° °CT ABDOMEN PELVIS W CONTRAST ° °Result Date: 07/24/2020 °CLINICAL DATA:  Abdominal abscess. Abdominal wall abscess. History of incompletely treated lumbar infection. Patient reports back and hip pain. EXAM: CT ABDOMEN AND PELVIS WITH CONTRAST TECHNIQUE: Multidetector CT imaging of the abdomen and pelvis was performed using the standard protocol following bolus administration of intravenous contrast. CONTRAST:  150mL OMNIPAQUE IOHEXOL 300 MG/ML  SOLN COMPARISON:  Most recent abdominal CT 07/29/2019 FINDINGS: Lower chest: No basilar consolidation or pleural fluid. Hepatobiliary: Enlarged liver spanning 22.6 cm cranial caudal. Diffuse steatosis. No focal hepatic abnormality. Gallbladder unremarkable. No biliary dilatation. Pancreas: No ductal dilatation or inflammation. Spleen: Upper normal in size spanning 12.6 cm. No focal abnormality. Adrenals/Urinary Tract: Normal adrenal glands. No hydronephrosis or perinephric edema. Homogeneous renal enhancement with symmetric excretion on delayed phase imaging. Urinary bladder is minimally distended without wall thickening. Stomach/Bowel: Fluid distended stomach without gastric wall thickening. There is no small bowel obstruction or inflammatory change. Normal appendix.  Scattered colonic diverticula without diverticulitis. No colonic wall thickening. Vascular/Lymphatic: Mild aortic atherosclerosis, advanced for age. No aortic aneurysm. Patent portal vein. Prominent periportal nodes are likely reactive. Reproductive: Prostatic calcifications Other: There is skin thickening with ill-defined edema involving the right lower anterior abdominal wall. Ill-defined fluid/edema spans approximately 3.3 x 2.0 cm in transverse by AP dimension and 2.4 cm in depth. There is no peripherally enhancing or well-circumscribed collection. No soft tissue air. No additional abscess in the abdomen or pelvis. Small fat containing umbilical hernia. Mild fat in the inguinal canals. Musculoskeletal: Previous erosive changes involving the right L4-L5 facet have healed. There is no evidence of discitis or vertebral osteomyelitis. 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 No definite acute bony destruction by CT. No evidence of intramuscular collection. IMPRESSION: 1. Skin thickening with ill-defined edema involving the right lower anterior abdominal wall consistent with cellulitis. Ill-defined fluid/edema spans approximately 3.3 x 2.0 x 2.4 cm. No peripherally enhancing or well-circumscribed collection. No soft tissue air. 2. No other findings of infection in the abdomen or pelvis. No CT findings of acute spinal infection. 3. Hepatomegaly and hepatic steatosis. 4. Colonic diverticulosis without diverticulitis. 5. 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 No definite acute bony destruction by CT. Aortic Atherosclerosis (ICD10-I70.0). Electronically Signed   By: Melanie  Sanford M.D.   On: 07/24/2020 21:10  ° °CT Maxillofacial W Contrast ° °Result Date: 07/24/2020 °CLINICAL DATA:  43-year-old male with right facial swelling since yesterday. Bed tooth. History of  staph infection. EXAM: CT MAXILLOFACIAL WITH CONTRAST TECHNIQUE: Multidetector CT imaging of the maxillofacial structures was performed with intravenous contrast. Multiplanar CT image reconstructions were also generated. CONTRAST:  150mL OMNIPAQUE IOHEXOL 300 MG/ML  SOLN COMPARISON:  Face CT 07/29/2018. FINDINGS: Osseous: Absent posterior dentition bilaterally with carious residual bilateral maxillary and mandible dentition. See soft tissue details below. Intact mandible. Other facial bones, central skull base, visible calvarium, an cervical vertebrae appear intact. Chronic degenerative changes about the odontoid. Orbits: Intact orbital walls. Disconjugate gaze but otherwise negative orbits soft tissues. Sinuses: Clear. Tympanic cavities and mastoids are clear. Soft tissues: Negative visible thyroid,   larynx, pharynx, parapharyngeal spaces, sublingual space, submandibular spaces, parotid and masticator spaces. Negative retropharyngeal space aside from a retropharyngeal course of both carotids. Soft tissue swelling and stranding along the right anterior face overlying the buccal space. And along the right anterior maxillary alveolar process there is suggestion of a tiny subperiosteal abscess (non drainable series 3, image 36) in proximity to carious right maxillary probable canine tooth with some periapical lucency. No soft tissue gas. No other soft tissue fluid collection. Mild reactive appearing right level 1 and level 2 lymph nodes. Limited intracranial: The major vascular structures in the neck and at the skull base are patent. Partially retropharyngeal course of both carotid arteries, tortuous right ICA below the skull base. Negative visible brain parenchyma. IMPRESSION: 1. Right facial cellulitis, probably odontogenic with infection source suspected to be the carious right maxillary canine with a subtle/trace subperiosteal abscess suspected near the root of that tooth. 2. Carious dentition elsewhere. Mild  reactive right level 1 and level 2 lymph nodes. Electronically Signed   By: H  Hall M.D.   On: 07/24/2020 21:12  ° °VAS US UPPER EXTREMITY VENOUS DUPLEX ° °Result Date: 07/25/2020 °UPPER VENOUS STUDY  Indications: Swelling, and Edema Risk Factors: IV Drug Use. Performing Technologist: Vernon [place] RCT RDMS  Examination Guidelines: A complete evaluation includes B-mode imaging, spectral Doppler, color Doppler, and power Doppler as needed of all accessible portions of each vessel. Bilateral testing is considered an integral part of a complete examination. Limited examinations for reoccurring indications may be performed as noted.  Right Findings: +----------+------------+---------+-----------+----------+-------+  RIGHT      Compressible Phasicity Spontaneous Properties Summary  +----------+------------+---------+-----------+----------+-------+  IJV            Full        Yes        Yes                         +----------+------------+---------+-----------+----------+-------+  Subclavian     Full        Yes        Yes                         +----------+------------+---------+-----------+----------+-------+  Axillary       Full        Yes        Yes                         +----------+------------+---------+-----------+----------+-------+  Brachial       Full        Yes        Yes                         +----------+------------+---------+-----------+----------+-------+  Radial         Full                                               +----------+------------+---------+-----------+----------+-------+  Ulnar          Full                                               +----------+------------+---------+-----------+----------+-------+    Cephalic       Full                                               +----------+------------+---------+-----------+----------+-------+  Basilic        Full                                               +----------+------------+---------+-----------+----------+-------+  Left Findings:  +----------+------------+---------+-----------+----------+-------+  LEFT       Compressible Phasicity Spontaneous Properties Summary  +----------+------------+---------+-----------+----------+-------+  IJV            Full        Yes        Yes                         +----------+------------+---------+-----------+----------+-------+  Subclavian     Full        Yes        Yes                         +----------+------------+---------+-----------+----------+-------+  Summary:  Right: No evidence of deep vein thrombosis in the upper extremity. No evidence of superficial vein thrombosis in the upper extremity.  Left: No evidence of thrombosis in the subclavian.  *See table(s) above for measurements and observations.  Diagnosing physician: Christopher Clark MD Electronically signed by Christopher Clark MD on 07/25/2020 at 4:42:55 PM.    Final   ° °Recent Results (from the past 240 hour(s))  °Culture, blood (routine x 2)     Status: Abnormal (Preliminary result)  ° Collection Time: 07/24/20  6:37 PM  ° Specimen: BLOOD RIGHT ARM  °Result Value Ref Range Status  ° Specimen Description   Final  °  BLOOD RIGHT ARM °Performed at [facility] [place], [address] [place], [place] [address] °  ° Special Requests   Final  °  BOTTLES DRAWN AEROBIC AND ANAEROBIC Blood Culture adequate volume °Performed at [facility] [place], [address] [place],  [place] [address] °  ° Culture  Setup Time   Final  °  GRAM POSITIVE COCCI IN CLUSTERS °ANAEROBIC BOTTLE ONLY °Organism ID to follow °CRITICAL RESULT CALLED TO, READ BACK BY AND VERIFIED WITH: H VON DOHLEN PHARMD 2051 07/25/20 A BROWNING °Performed at [facility], [address] , [place] [address] °  ° Culture STAPHYLOCOCCUS AUREUS (A)  Final  ° Report Status PENDING  Incomplete  °Blood Culture ID Panel (Reflexed)     Status: Abnormal  ° Collection Time: 07/24/20  6:37 PM  °Result Value Ref Range Status  ° Enterococcus faecalis NOT DETECTED NOT DETECTED Final    ° Enterococcus Faecium NOT DETECTED NOT DETECTED Final  ° Listeria monocytogenes NOT DETECTED NOT DETECTED Final  ° Staphylococcus species DETECTED (A) NOT DETECTED Final  °  Comment: CRITICAL RESULT CALLED TO, READ BACK BY AND VERIFIED WITH: °H VON DOHLEN PHARMD 2051 07/25/20 A BROWNING °  ° Staphylococcus aureus (BCID) DETECTED (A) NOT DETECTED Final  °  Comment: CRITICAL RESULT CALLED TO, READ BACK BY AND VERIFIED WITH: °H VON DOHLEN PHARMD 2051 07/25/20 A BROWNING °  ° Staphylococcus epidermidis NOT DETECTED   NOT DETECTED Final  ° Staphylococcus lugdunensis NOT DETECTED NOT DETECTED Final  ° Streptococcus species NOT DETECTED NOT DETECTED Final  ° Streptococcus agalactiae NOT DETECTED NOT DETECTED Final  ° Streptococcus pneumoniae NOT DETECTED NOT DETECTED Final  ° Streptococcus pyogenes NOT DETECTED NOT DETECTED Final  ° A.calcoaceticus-baumannii NOT DETECTED NOT DETECTED Final  ° Bacteroides fragilis NOT DETECTED NOT DETECTED Final  ° Enterobacterales NOT DETECTED NOT DETECTED Final  ° Enterobacter cloacae complex NOT DETECTED NOT DETECTED Final  ° Escherichia coli NOT DETECTED NOT DETECTED Final  ° Klebsiella aerogenes NOT DETECTED NOT DETECTED Final  ° Klebsiella oxytoca NOT DETECTED NOT DETECTED Final  ° Klebsiella pneumoniae NOT DETECTED NOT DETECTED Final  ° Proteus species NOT DETECTED NOT DETECTED Final  ° Salmonella species NOT DETECTED NOT DETECTED Final  ° Serratia marcescens NOT DETECTED NOT DETECTED Final  ° Haemophilus influenzae NOT DETECTED NOT DETECTED Final  ° Neisseria meningitidis NOT DETECTED NOT DETECTED Final  ° Pseudomonas aeruginosa NOT DETECTED NOT DETECTED Final  ° Stenotrophomonas maltophilia NOT DETECTED NOT DETECTED Final  ° Candida albicans NOT DETECTED NOT DETECTED Final  ° Candida auris NOT DETECTED NOT DETECTED Final  ° Candida glabrata NOT DETECTED NOT DETECTED Final  ° Candida krusei NOT DETECTED NOT DETECTED Final  ° Candida parapsilosis NOT DETECTED NOT DETECTED Final  °  Candida tropicalis NOT DETECTED NOT DETECTED Final  ° Cryptococcus neoformans/gattii NOT DETECTED NOT DETECTED Final  ° Meth resistant mecA/C and MREJ NOT DETECTED NOT DETECTED Final  °  Comment: Performed at [facility], [address] [place], [place] [address]  °SARS Coronavirus 2 by RT PCR (hospital order, performed in [place] hospital lab) Nasopharyngeal Peripheral     Status: None  ° Collection Time: 07/24/20  7:28 PM  ° Specimen: Peripheral; Nasopharyngeal  °Result Value Ref Range Status  ° SARS Coronavirus 2 NEGATIVE NEGATIVE Final  °  Comment: (NOTE) °SARS-CoV-2 target nucleic acids are NOT DETECTED. ° °The SARS-CoV-2 RNA is generally detectable in upper and lower °respiratory specimens during the acute phase of infection. The lowest °concentration of SARS-CoV-2 viral copies this assay can detect is 250 °copies / mL. A negative result does not preclude SARS-CoV-2 infection °and should not be used as the sole basis for treatment or other °patient management decisions.  A negative result may occur with °improper specimen collection / handling, submission of specimen other °than nasopharyngeal swab, presence of viral mutation(s) within the °areas targeted by this assay, and inadequate number of viral copies °(<250 copies / mL). A negative result must be combined with clinical °observations, patient history, and epidemiological information. ° °Fact Sheet for Patients:   °https://www.fda.gov/media/136312/download ° °Fact Sheet for Healthcare Providers: °https://www.fda.gov/media/136313/download ° °This test is not yet approved or  cleared by the [place] FDA and °has been authorized for detection and/or diagnosis of SARS-CoV-2 by °FDA under an Emergency Use Authorization (EUA).  This EUA will remain °in effect (meaning this test can be used) for the duration of the °COVID-19 declaration under Section 564(b)(1) of the Act, 21 U.S.C. °section 360bbb-3(b)(1), unless the authorization is terminated  or °revoked sooner. ° °Performed at [facility] [place], [address] [place], [place] [address] °  °MRSA PCR Screening     Status: Abnormal  ° Collection Time: 07/25/20  4:21 AM  ° Specimen: Nasopharyngeal  °Result Value Ref Range Status  ° MRSA by PCR POSITIVE (A) NEGATIVE Final  °  Comment:        °  The GeneXpert MRSA Assay (FDA °approved for NASAL specimens °only), is one component of a °comprehensive MRSA colonization °surveillance program. It is not °intended to diagnose MRSA °infection nor to guide or °monitor treatment for °MRSA infections. °RESULT CALLED TO, READ BACK BY AND VERIFIED WITH: °RN SHARON WHITEHORN 0724 [id number] FCP °Performed at [facility], [address] [place], [place] [address] °  ° ° °Microbiology: °Recent Results (from the past 240 hour(s))  °Culture, blood (routine x 2)     Status: Abnormal (Preliminary result)  ° Collection Time: 07/24/20  6:37 PM  ° Specimen: BLOOD RIGHT ARM  °Result Value Ref Range Status  ° Specimen Description   Final  °  BLOOD RIGHT ARM °Performed at [facility] [place], [address] [place], [place] [address] °  ° Special Requests   Final  °  BOTTLES DRAWN AEROBIC AND ANAEROBIC Blood Culture adequate volume °Performed at [facility] [place], [address] [place], [place] [address] °  ° Culture  Setup Time   Final  °  GRAM POSITIVE COCCI IN CLUSTERS °ANAEROBIC BOTTLE ONLY °Organism ID to follow °CRITICAL RESULT CALLED TO, READ BACK BY AND VERIFIED WITH: H VON DOHLEN PHARMD 2051 07/25/20 A BROWNING °Performed at [facility], [address] [place],  [address] °  ° Culture STAPHYLOCOCCUS AUREUS (A)  Final  ° Report Status PENDING  Incomplete  °Blood Culture ID Panel (Reflexed)     Status: Abnormal  ° Collection Time: 07/24/20  6:37 PM  °Result Value Ref Range Status  ° Enterococcus faecalis NOT DETECTED NOT DETECTED Final  ° Enterococcus Faecium NOT DETECTED NOT DETECTED Final  ° Listeria monocytogenes NOT DETECTED NOT  DETECTED Final  ° Staphylococcus species DETECTED (A) NOT DETECTED Final  °  Comment: CRITICAL RESULT CALLED TO, READ BACK BY AND VERIFIED WITH: °H VON DOHLEN PHARMD 2051 07/25/20 A BROWNING °  ° Staphylococcus aureus (BCID) DETECTED (A) NOT DETECTED Final  °  Comment: CRITICAL RESULT CALLED TO, READ BACK BY AND VERIFIED WITH: °H VON DOHLEN PHARMD 2051 07/25/20 A BROWNING °  ° Staphylococcus epidermidis NOT DETECTED NOT DETECTED Final  ° Staphylococcus lugdunensis NOT DETECTED NOT DETECTED Final  ° Streptococcus species NOT DETECTED NOT DETECTED Final  ° Streptococcus agalactiae NOT DETECTED NOT DETECTED Final  ° Streptococcus pneumoniae NOT DETECTED NOT DETECTED Final  ° Streptococcus pyogenes NOT DETECTED NOT DETECTED Final  ° A.calcoaceticus-baumannii NOT DETECTED NOT DETECTED Final  ° Bacteroides fragilis NOT DETECTED NOT DETECTED Final  ° Enterobacterales NOT DETECTED NOT DETECTED Final  ° Enterobacter cloacae complex NOT DETECTED NOT DETECTED Final  ° Escherichia coli NOT DETECTED NOT DETECTED Final  ° Klebsiella aerogenes NOT DETECTED NOT DETECTED Final  ° Klebsiella oxytoca NOT DETECTED NOT DETECTED Final  ° Klebsiella pneumoniae NOT DETECTED NOT DETECTED Final  ° Proteus species NOT DETECTED NOT DETECTED Final  ° Salmonella species NOT DETECTED NOT DETECTED Final  ° Serratia marcescens NOT DETECTED NOT DETECTED Final  ° Haemophilus influenzae NOT DETECTED NOT DETECTED Final  ° Neisseria meningitidis NOT DETECTED NOT DETECTED Final  ° Pseudomonas aeruginosa NOT DETECTED NOT DETECTED Final  ° Stenotrophomonas maltophilia NOT DETECTED NOT DETECTED Final  ° Candida albicans NOT DETECTED NOT DETECTED Final  ° Candida auris NOT DETECTED NOT DETECTED Final  ° Candida glabrata NOT DETECTED NOT DETECTED Final  ° Candida krusei NOT DETECTED NOT DETECTED Final  ° Candida parapsilosis NOT DETECTED NOT DETECTED Final  ° Candida tropicalis   NOT DETECTED NOT DETECTED Final  ° Cryptococcus neoformans/gattii NOT DETECTED NOT  DETECTED Final  ° Meth resistant mecA/C and MREJ NOT DETECTED NOT DETECTED Final  °  Comment: Performed at [facility], [address] [place], [place] [address]  °SARS Coronavirus 2 by RT PCR (hospital order, performed in [place] hospital lab) Nasopharyngeal Peripheral     Status: None  ° Collection Time: 07/24/20  7:28 PM  ° Specimen: Peripheral; Nasopharyngeal  °Result Value Ref Range Status  ° SARS Coronavirus 2 NEGATIVE NEGATIVE Final  °  Comment: (NOTE) °SARS-CoV-2 target nucleic acids are NOT DETECTED. ° °The SARS-CoV-2 RNA is generally detectable in upper and lower °respiratory specimens during the acute phase of infection. The lowest °concentration of SARS-CoV-2 viral copies this assay can detect is 250 °copies / mL. A negative result does not preclude SARS-CoV-2 infection °and should not be used as the sole basis for treatment or other °patient management decisions.  A negative result may occur with °improper specimen collection / handling, submission of specimen other °than nasopharyngeal swab, presence of viral mutation(s) within the °areas targeted by this assay, and inadequate number of viral copies °(<250 copies / mL). A negative result must be combined with clinical °observations, patient history, and epidemiological information. ° °Fact Sheet for Patients:   °https://www.fda.gov/media/136312/download ° °Fact Sheet for Healthcare Providers: °https://www.fda.gov/media/136313/download ° °This test is not yet approved or  cleared by the [place] FDA and °has been authorized for detection and/or diagnosis of SARS-CoV-2 by °FDA under an Emergency Use Authorization (EUA).  This EUA will remain °in effect (meaning this test can be used) for the duration of the °COVID-19 declaration under Section 564(b)(1) of the Act, 21 U.S.C. °section 360bbb-3(b)(1), unless the authorization is terminated or °revoked sooner. ° °Performed at [facility] [place], [address] [place], [place]  [address] °  °MRSA PCR Screening     Status: Abnormal  ° Collection Time: 07/25/20  4:21 AM  ° Specimen: Nasopharyngeal  °Result Value Ref Range Status  ° MRSA by PCR POSITIVE (A) NEGATIVE Final  °  Comment:        °The GeneXpert MRSA Assay (FDA °approved for NASAL specimens °only), is one component of a °comprehensive MRSA colonization °surveillance program. It is not °intended to diagnose MRSA °infection nor to guide or °monitor treatment for °MRSA infections. °RESULT CALLED TO, READ BACK BY AND VERIFIED WITH: °RN SHARON WHITEHORN 0724 [id number] FCP °Performed at [facility], [address] [place], [place] [address] °  ° ° °Radiographs and labs were personally reviewed by me.  ° °Alvon Nygaard, MD FACP °[facility] °[facility] °[phone] °07/26/2020, 12:44 PM ° °

## 2020-07-26 NOTE — Progress Notes (Signed)
Multiple attempts via MAR to have clindamycin tubed to the floor. Still waiting on IV antibiotic from pharmacy to administer. Will adjust administration time.

## 2020-07-26 NOTE — Plan of Care (Signed)
?  Problem: Clinical Measurements: ?Goal: Ability to avoid or minimize complications of infection will improve ?Outcome: Progressing ?  ?Problem: Skin Integrity: ?Goal: Skin integrity will improve ?Outcome: Progressing ?  ?

## 2020-07-26 NOTE — Consult Note (Signed)
WOC Nurse Consult Note: Reason for Consult: Spontaneously ruptured "boil" in right lower quadrant per patient. On systemic antibiotics. Wound type:Infectious  Hospitalist MD note on 07/25/20 indicates that if no improvement or if need for I&D presents that they will consult Surgery ([facility]).  I agree with that POC. I have communicated with Dr. Butler Denmark this morning via Secure Chat and we agree that [facility] nurse Consult is not needed at this time.  WOC nursing team will not follow, but will remain available to this patient, the nursing and medical teams.  Please re-consult if needed. Thanks, Ladona Mow, MSN, RN, GNP, [place]  Pager# [phone]

## 2020-07-26 NOTE — Progress Notes (Signed)
Notified Dr. Butler Denmark about pt's blood pressure taken this morning. Blood pressure at 09:04 was 98/81, pulse 77. Taken a second time at 09:24, blood pressure 115/99, pulse 70. No new orders at this time. Will continue to monitor.

## 2020-07-26 NOTE — Progress Notes (Signed)
PROGRESS NOTE    Joshua Jacobs   ZDG:[id number]  DOB: 05/16/1976  DOA: 07/24/2020 PCP: Patient, No Pcp Per   Brief Narrative:  Joshua Jacobs is a 44 year old male with history of IV drug use, severe morbid obesity, essential hypertension, OSA, history of osteomyelitis spine, tobacco use, dental caries, hepatitis C, chronic pain syndrome presented with right-sided facial swelling and severe pain. He has a h/o of IV drug abuse and states the pain in his face resulted in a relapse.  Subjective: Pain and swelling in face slightly improved    Assessment & Plan:   Principal Problem:   Cellulitis with staph aureus bacteremia -cellulitis of left face - initially thought to be related to dental carries but blood cultures from 9/9 positive for staph - still continues to have some swelling of the face especially under left eye but thinks he can eat solid food- will advance to soft diet - cont to follow sensitivities to determine if this is MRSA- he will likely need a TEE- will go ahead and order at [facility] today - abdominal wound was likely an abscess and is now healing well- will order nonstick dressing to wound  - will repeat blood cultures - cont Vanc  Active Problems:   IV drug user - has been counseled - UDS revealed opiates and cocaine   HTN - Amlodipine  Time spent in minutes: 35 DVT prophylaxis: SCDs Code Status: full code Family Communication:  Disposition Plan:  Status is: Inpatient  Remains inpatient appropriate because:IV antibiotic treatment   Dispo: The patient is from: Home              Anticipated d/c is to: Home              Anticipated d/c date is: > 3 days              Patient currently is not medically stable to d/c.      Consultants:   none Procedures:   none Antimicrobials:  Anti-infectives (From admission, onward)   Start     Dose/Rate Route Frequency Ordered Stop   07/25/20 1000  vancomycin (VANCOREADY) IVPB 1500 mg/300 mL        1,500  mg 150 mL/hr over 120 Minutes Intravenous Every 12 hours 07/25/20 0736     07/25/20 0200  vancomycin (VANCOCIN) IVPB 1000 mg/200 mL premix  Status:  Discontinued        1,000 mg 200 mL/hr over 60 Minutes Intravenous  Once 07/25/20 0059 07/25/20 0736   07/25/20 0100  vancomycin (VANCOCIN) IVPB 1000 mg/200 mL premix        1,000 mg 200 mL/hr over 60 Minutes Intravenous  Once 07/25/20 0059 07/25/20 0208   07/24/20 2245  clindamycin (CLEOCIN) IVPB 600 mg        600 mg 100 mL/hr over 30 Minutes Intravenous  Once 07/24/20 2244 07/24/20 2341   07/24/20 1915  vancomycin (VANCOCIN) IVPB 1000 mg/200 mL premix        1,000 mg 200 mL/hr over 60 Minutes Intravenous  Once 07/24/20 1914 07/24/20 2037       Objective: Vitals:   07/25/20 1932 07/26/20 0451 07/26/20 0904 07/26/20 0924  BP: (!) 149/91 139/86 98/81 (!) 115/99  Pulse: 71 80 77 70  Resp: 17 17 17    Temp: 98.6 F (37 C) 98.6 F (37 C) 98.4 F (36.9 C)   TempSrc: Oral Oral Oral   SpO2: 95% 96% 97% 97%  Weight:      Height:  No intake or output data in the 24 hours ending 07/26/20 1253 Filed Weights   07/24/20 1811  Weight: (!) 144.3 kg    Examination: General exam: Appears comfortable  HEENT: PERRLA, oral mucosa moist, no sclera icterus or thrush Respiratory system: Clear to auscultation. Respiratory effort normal. Cardiovascular system: S1 & S2 heard, RRR.   Gastrointestinal system: Abdomen soft, non-tender, nondistended. Normal bowel sounds. Central nervous system: Alert and oriented. No focal neurological deficits. Extremities: No cyanosis, clubbing - he has b/l edema of hands and arms  Skin: No rashes or ulcers- numerous track marks on arms- 2 in diameter wound on abdomen - not draining Psychiatry:  Mood & affect appropriate.     Data Reviewed: I have personally reviewed following labs and imaging studies  CBC: Recent Labs  Lab 07/24/20 1836 07/25/20 1403  WBC 12.9* 9.8  NEUTROABS 10.0* 6.6  HGB 12.3*  11.0*  HCT 38.8* 34.9*  MCV 76.4* 77.6*  PLT 465* 406*   Basic Metabolic Panel: Recent Labs  Lab 07/24/20 1836 07/25/20 1403  NA 135 136  K 3.5 3.9  CL 100 101  CO2 24 25  GLUCOSE 137* 101*  BUN 13 8  CREATININE 1.10 0.96  CALCIUM 9.4 9.2   GFR: Estimated Creatinine Clearance: 137.5 mL/min (by C-G formula based on SCr of 0.96 mg/dL). Liver Function Tests: Recent Labs  Lab 07/24/20 1836 07/25/20 1403  AST 20 16  ALT 16 14  ALKPHOS 112 109  BILITOT 0.3 0.8  PROT 9.5* 8.1  ALBUMIN 4.2 3.5   No results for input(s): LIPASE, AMYLASE in the last 168 hours. No results for input(s): AMMONIA in the last 168 hours. Coagulation Profile: No results for input(s): INR, PROTIME in the last 168 hours. Cardiac Enzymes: No results for input(s): CKTOTAL, CKMB, CKMBINDEX, TROPONINI in the last 168 hours. BNP (last 3 results) No results for input(s): PROBNP in the last 8760 hours. HbA1C: No results for input(s): HGBA1C in the last 72 hours. CBG: No results for input(s): GLUCAP in the last 168 hours. Lipid Profile: No results for input(s): CHOL, HDL, LDLCALC, TRIG, CHOLHDL, LDLDIRECT in the last 72 hours. Thyroid Function Tests: No results for input(s): TSH, T4TOTAL, FREET4, T3FREE, THYROIDAB in the last 72 hours. Anemia Panel: No results for input(s): VITAMINB12, FOLATE, FERRITIN, TIBC, IRON, RETICCTPCT in the last 72 hours. Urine analysis:    Component Value Date/Time   COLORURINE YELLOW 07/24/2020 1928   APPEARANCEUR CLOUDY (A) 07/24/2020 1928   LABSPEC >1.030 (H) 07/24/2020 1928   PHURINE 6.0 07/24/2020 1928   GLUCOSEU NEGATIVE 07/24/2020 1928   HGBUR NEGATIVE 07/24/2020 1928   BILIRUBINUR SMALL (A) 07/24/2020 1928   KETONESUR NEGATIVE 07/24/2020 1928   PROTEINUR 30 (A) 07/24/2020 1928   NITRITE NEGATIVE 07/24/2020 1928   LEUKOCYTESUR NEGATIVE 07/24/2020 1928   Sepsis Labs: (procalcitonin:4,lacticidven:4) ) Recent Results (from the past 240 hour(s))   Culture, blood (routine x 2)     Status: Abnormal (Preliminary result)   Collection Time: 07/24/20  6:37 PM   Specimen: BLOOD RIGHT ARM  Result Value Ref Range Status   Specimen Description   Final    BLOOD RIGHT ARM Performed at [facility], [address] [place], [place] [address]    Special Requests   Final    BOTTLES DRAWN AEROBIC AND ANAEROBIC Blood Culture adequate volume Performed at [facility], [address] [place], [place] [address]    Culture  Setup Time   Final  GRAM POSITIVE COCCI IN CLUSTERS ANAEROBIC BOTTLE ONLY Organism ID to follow CRITICAL RESULT CALLED TO, READ BACK BY AND VERIFIED WITHCandiss Norse [facility] 2051 07/25/20 A BROWNING Performed at [facility], [address] [place], [place] [address]    Culture STAPHYLOCOCCUS AUREUS (A)  Final   Report Status PENDING  Incomplete  Blood Culture ID Panel (Reflexed)     Status: Abnormal   Collection Time: 07/24/20  6:37 PM  Result Value Ref Range Status   Enterococcus faecalis NOT DETECTED NOT DETECTED Final   Enterococcus Faecium NOT DETECTED NOT DETECTED Final   Listeria monocytogenes NOT DETECTED NOT DETECTED Final   Staphylococcus species DETECTED (A) NOT DETECTED Final    Comment: CRITICAL RESULT CALLED TO, READ BACK BY AND VERIFIED WITH: H VON [facility] PHARMD 2051 07/25/20 A BROWNING    Staphylococcus aureus (BCID) DETECTED (A) NOT DETECTED Final    Comment: CRITICAL RESULT CALLED TO, READ BACK BY AND VERIFIED WITH: H VON [facility] PHARMD 2051 07/25/20 A BROWNING    Staphylococcus epidermidis NOT DETECTED NOT DETECTED Final   Staphylococcus lugdunensis NOT DETECTED NOT DETECTED Final   Streptococcus species NOT DETECTED NOT DETECTED Final   Streptococcus agalactiae NOT DETECTED NOT DETECTED Final   Streptococcus pneumoniae NOT DETECTED NOT DETECTED Final   Streptococcus pyogenes NOT DETECTED NOT DETECTED Final   A.calcoaceticus-baumannii NOT DETECTED NOT DETECTED Final    Bacteroides fragilis NOT DETECTED NOT DETECTED Final   Enterobacterales NOT DETECTED NOT DETECTED Final   Enterobacter cloacae complex NOT DETECTED NOT DETECTED Final   Escherichia coli NOT DETECTED NOT DETECTED Final   Klebsiella aerogenes NOT DETECTED NOT DETECTED Final   Klebsiella oxytoca NOT DETECTED NOT DETECTED Final   Klebsiella pneumoniae NOT DETECTED NOT DETECTED Final   Proteus species NOT DETECTED NOT DETECTED Final   Salmonella species NOT DETECTED NOT DETECTED Final   Serratia marcescens NOT DETECTED NOT DETECTED Final   Haemophilus influenzae NOT DETECTED NOT DETECTED Final   Neisseria meningitidis NOT DETECTED NOT DETECTED Final   Pseudomonas aeruginosa NOT DETECTED NOT DETECTED Final   Stenotrophomonas maltophilia NOT DETECTED NOT DETECTED Final   Candida albicans NOT DETECTED NOT DETECTED Final   Candida auris NOT DETECTED NOT DETECTED Final   Candida glabrata NOT DETECTED NOT DETECTED Final   Candida krusei NOT DETECTED NOT DETECTED Final   Candida parapsilosis NOT DETECTED NOT DETECTED Final   Candida tropicalis NOT DETECTED NOT DETECTED Final   Cryptococcus neoformans/gattii NOT DETECTED NOT DETECTED Final   Meth resistant mecA/C and MREJ NOT DETECTED NOT DETECTED Final    Comment: Performed at [facility], [address] [place], [place] [address]  SARS Coronavirus 2 by RT PCR (hospital order, performed in [facility] hospital lab) Nasopharyngeal Peripheral     Status: None   Collection Time: 07/24/20  7:28 PM   Specimen: Peripheral; Nasopharyngeal  Result Value Ref Range Status   SARS Coronavirus 2 NEGATIVE NEGATIVE Final    Comment: (NOTE) SARS-CoV-2 target nucleic acids are NOT DETECTED.  The SARS-CoV-2 RNA is generally detectable in upper and lower respiratory specimens during the acute phase of infection. The lowest concentration of SARS-CoV-2 viral copies this assay can detect is 250 copies / mL. A negative result does not preclude SARS-CoV-2  infection and should not be used as the sole basis for treatment or other patient management decisions.  A negative result may occur with improper specimen collection / handling, submission of specimen other than nasopharyngeal swab, presence  of viral mutation(s) within the areas targeted by this assay, and inadequate number of viral copies (<250 copies / mL). A negative result must be combined with clinical observations, patient history, and epidemiological information.  Fact Sheet for Patients:   BoilerBrush.com.cy  Fact Sheet for Healthcare Providers: https://pope.com/  This test is not yet approved or  cleared by the [place] FDA and has been authorized for detection and/or diagnosis of SARS-CoV-2 by FDA under an Emergency Use Authorization (EUA).  This EUA will remain in effect (meaning this test can be used) for the duration of the COVID-19 declaration under Section 564(b)(1) of the Act, 21 U.S.C. section 360bbb-3(b)(1), unless the authorization is terminated or revoked sooner.  Performed at [facility], [address] [place], [place] [address]   MRSA PCR Screening     Status: Abnormal   Collection Time: 07/25/20  4:21 AM   Specimen: Nasopharyngeal  Result Value Ref Range Status   MRSA by PCR POSITIVE (A) NEGATIVE Final    Comment:        The GeneXpert MRSA Assay (FDA approved for NASAL specimens only), is one component of a comprehensive MRSA colonization surveillance program. It is not intended to diagnose MRSA infection nor to guide or monitor treatment for MRSA infections. RESULT CALLED TO, READ BACK BY AND VERIFIED WITH: RN Jasmine December [facility] 5400 [phone] FCP Performed at [facility], [address], [place], [place] [address]          Radiology Studies: DG Chest 2 View  Result Date: 07/24/2020 CLINICAL DATA:  Suspected sepsis. Right facial swelling since yesterday. Chest pressure.  EXAM: CHEST - 2 VIEW COMPARISON:  Most recent radiograph 07/29/2019. Most recent CT 03/05/2020 FINDINGS: Stable heart size and mediastinal contours. Borderline cardiomegaly. No focal airspace disease. No pulmonary edema. No pleural effusion or pneumothorax no evidence of acute osseous abnormality. IMPRESSION: No acute findings. Borderline cardiomegaly. Electronically Signed   By: Narda Rutherford M.D.   On: 07/24/2020 18:50   DG Forearm Right  Result Date: 07/24/2020 CLINICAL DATA:  Abscess.  Concern for foreign body. EXAM: RIGHT FOREARM - 2 VIEW COMPARISON:  None. FINDINGS: There is extensive soft tissue swelling about the forearm without evidence for an acute displaced fracture or dislocation. There is no radiographic evidence for osteomyelitis. There are no definite pockets of subcutaneous gas. There is no radiopaque foreign body. IMPRESSION: Extensive soft tissue swelling about the forearm without evidence for an acute displaced fracture or dislocation. No radiopaque foreign body. No radiographic evidence for osteomyelitis. Electronically Signed   By: Katherine Mantle M.D.   On: 07/24/2020 19:44   CT ABDOMEN PELVIS W CONTRAST  Result Date: 07/24/2020 CLINICAL DATA:  Abdominal abscess. Abdominal wall abscess. History of incompletely treated lumbar infection. Patient reports back and hip pain. EXAM: CT ABDOMEN AND PELVIS WITH CONTRAST TECHNIQUE: Multidetector CT imaging of the abdomen and pelvis was performed using the standard protocol following bolus administration of intravenous contrast. CONTRAST:  OMNIPAQUE IOHEXOL 300 MG/ML  SOLN COMPARISON:  Most recent abdominal CT 07/29/2019 FINDINGS: Lower chest: No basilar consolidation or pleural fluid. Hepatobiliary: Enlarged liver spanning 22.6 cm cranial caudal. Diffuse steatosis. No focal hepatic abnormality. Gallbladder unremarkable. No biliary dilatation. Pancreas: No ductal dilatation or inflammation. Spleen: Upper normal in size spanning 12.6 cm.  No focal abnormality. Adrenals/Urinary Tract: Normal adrenal glands. No hydronephrosis or perinephric edema. Homogeneous renal enhancement with symmetric excretion on delayed phase imaging. Urinary bladder is minimally distended without wall thickening. Stomach/Bowel:  Fluid distended stomach without gastric wall thickening. There is no small bowel obstruction or inflammatory change. Normal appendix. Scattered colonic diverticula without diverticulitis. No colonic wall thickening. Vascular/Lymphatic: Mild aortic atherosclerosis, advanced for age. No aortic aneurysm. Patent portal vein. Prominent periportal nodes are likely reactive. Reproductive: Prostatic calcifications Other: There is skin thickening with ill-defined edema involving the right lower anterior abdominal wall. Ill-defined fluid/edema spans approximately 3.3 x 2.0 cm in transverse by AP dimension and 2.4 cm in depth. There is no peripherally enhancing or well-circumscribed collection. No soft tissue air. No additional abscess in the abdomen or pelvis. Small fat containing umbilical hernia. Mild fat in the inguinal canals. Musculoskeletal: Previous erosive changes involving the right L4-L5 facet have healed. There is no evidence of discitis or vertebral osteomyelitis. 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 No definite acute bony destruction by CT. No evidence of intramuscular collection. IMPRESSION: 1. Skin thickening with ill-defined edema involving the right lower anterior abdominal wall consistent with cellulitis. Ill-defined fluid/edema spans approximately 3.3 x 2.0 x 2.4 cm. No peripherally enhancing or well-circumscribed collection. No soft tissue air. 2. No other findings of infection in the abdomen or pelvis. No CT findings of acute spinal infection. 3. Hepatomegaly and hepatic steatosis. 4. Colonic diverticulosis without diverticulitis. 5. 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 No definite acute bony destruction by CT. Aortic Atherosclerosis (ICD10-I70.0). Electronically Signed   By: Narda Rutherford M.D.   On: 07/24/2020 21:10   CT Maxillofacial W Contrast  Result Date: 07/24/2020 CLINICAL DATA:  44 year old male with right facial swelling since yesterday. Bed tooth. History of staph infection. EXAM: CT MAXILLOFACIAL WITH CONTRAST TECHNIQUE: Multidetector CT imaging of the maxillofacial structures was performed with intravenous contrast. Multiplanar CT image reconstructions were also generated. CONTRAST:  OMNIPAQUE IOHEXOL 300 MG/ML  SOLN COMPARISON:  Face CT 07/29/2018. FINDINGS: Osseous: Absent posterior dentition bilaterally with carious residual bilateral maxillary and mandible dentition. See soft tissue details below. Intact mandible. Other facial bones, central skull base, visible calvarium, an cervical vertebrae appear intact. Chronic degenerative changes about the odontoid. Orbits: Intact orbital walls. Disconjugate gaze but otherwise negative orbits soft tissues. Sinuses: Clear. Tympanic cavities and mastoids are clear. Soft tissues: Negative visible thyroid, larynx, pharynx, parapharyngeal spaces, sublingual space, submandibular spaces, parotid and masticator spaces. Negative retropharyngeal space aside from a retropharyngeal course of both carotids. Soft tissue swelling and stranding along the right anterior face overlying the buccal space. And along the right anterior maxillary alveolar process there is suggestion of a tiny subperiosteal abscess (non drainable series 3, image 36) in proximity to carious right maxillary probable canine tooth with some periapical lucency. No soft tissue gas. No other soft tissue fluid collection. Mild reactive appearing right level 1 and level 2 lymph nodes. Limited intracranial: The major vascular structures in the neck and at the skull base  are patent. Partially retropharyngeal course of both carotid arteries, tortuous right ICA below the skull base. Negative visible brain parenchyma. IMPRESSION: 1. Right facial cellulitis, probably odontogenic with infection source suspected to be the carious right maxillary canine with a subtle/trace subperiosteal abscess suspected near the root of that tooth. 2. Carious dentition elsewhere. Mild reactive right level 1 and level 2 lymph nodes. Electronically Signed   By: Odessa Fleming M.D.   On: 07/24/2020 21:12   VAS [place] UPPER EXTREMITY VENOUS DUPLEX  Result Date: 07/25/2020  UPPER VENOUS STUDY  Indications: Swelling, and Edema Risk Factors: IV Drug Use. Performing Technologist: Jannet Askew RCT RDMS  Examination Guidelines: A complete evaluation includes B-mode imaging, spectral Doppler, color Doppler, and power Doppler as needed of all accessible portions of each vessel. Bilateral testing is considered an integral part of a complete examination. Limited examinations for reoccurring indications may be performed as noted.  Right Findings: +----------+------------+---------+-----------+----------+-------+ RIGHT     CompressiblePhasicitySpontaneousPropertiesSummary +----------+------------+---------+-----------+----------+-------+ IJV           Full       Yes       Yes                      +----------+------------+---------+-----------+----------+-------+ Subclavian    Full       Yes       Yes                      +----------+------------+---------+-----------+----------+-------+ Axillary      Full       Yes       Yes                      +----------+------------+---------+-----------+----------+-------+ Brachial      Full       Yes       Yes                      +----------+------------+---------+-----------+----------+-------+ Radial        Full                                          +----------+------------+---------+-----------+----------+-------+ Ulnar         Full                                           +----------+------------+---------+-----------+----------+-------+ Cephalic      Full                                          +----------+------------+---------+-----------+----------+-------+ Basilic       Full                                          +----------+------------+---------+-----------+----------+-------+  Left Findings: +----------+------------+---------+-----------+----------+-------+ LEFT      CompressiblePhasicitySpontaneousPropertiesSummary +----------+------------+---------+-----------+----------+-------+ IJV           Full       Yes       Yes                      +----------+------------+---------+-----------+----------+-------+ Subclavian    Full       Yes       Yes                      +----------+------------+---------+-----------+----------+-------+  Summary:  Right: No evidence of deep vein thrombosis in the upper extremity. No evidence of superficial vein thrombosis in the upper extremity.  Left: No evidence of thrombosis in the subclavian.  *See table(s) above for measurements and observations.  Diagnosing physician: Cristal Deer  Chestine Spore MD Electronically signed by Sherald Hess MD on 07/25/2020 at 4:42:55 PM.    Final       Scheduled Meds: . amLODipine  5 mg Oral Daily  . Chlorhexidine Gluconate Cloth  6 each Topical Q0600  . FLUoxetine  20 mg Oral Daily  . gabapentin  900 mg Oral BID  . hydrOXYzine  100 mg Oral TID  . LORazepam  1 mg Oral QHS  . mupirocin ointment  1 application Nasal BID   Continuous Infusions: . sodium chloride 10 mL/hr at 07/25/20 0107  . vancomycin 1,500 mg (07/26/20 1010)     LOS: 1 day      Calvert Cantor, MD [facility] Pager: [email or web address] 07/26/2020, 12:53 PM

## 2020-07-27 ENCOUNTER — Inpatient Hospital Stay (HOSPITAL_COMMUNITY): Payer: Self-pay

## 2020-07-27 LAB — CULTURE, BLOOD (ROUTINE X 2): Special Requests: ADEQUATE

## 2020-07-27 LAB — ECHOCARDIOGRAM COMPLETE
Area-P 1/2: 3.42 cm2
Height: 66 in
S' Lateral: 3.6 cm
Weight: 5091.2 oz

## 2020-07-27 LAB — VANCOMYCIN, TROUGH: Vancomycin Tr: 12 ug/mL — ABNORMAL LOW (ref 15–20)

## 2020-07-27 MED ORDER — HEPATITIS B VAC RECOMBINANT 10 MCG/ML IJ SUSP
1.0000 mL | Freq: Once | INTRAMUSCULAR | Status: AC
  Administered 2020-07-27: 10 ug via INTRAMUSCULAR
  Filled 2020-07-27 (×2): qty 1

## 2020-07-27 MED ORDER — HEPATITIS A VACCINE 1440 EL U/ML IM SUSP
1.0000 mL | Freq: Once | INTRAMUSCULAR | Status: AC
  Administered 2020-07-27: 1440 [IU] via INTRAMUSCULAR
  Filled 2020-07-27 (×2): qty 1

## 2020-07-27 MED ORDER — LORAZEPAM 2 MG/ML IJ SOLN
1.0000 mg | INTRAMUSCULAR | Status: AC
  Administered 2020-07-27: 1 mg via INTRAVENOUS
  Filled 2020-07-27: qty 1

## 2020-07-27 NOTE — Progress Notes (Signed)
INFECTIOUS DISEASE PROGRESS NOTE  ID: Arrian Manson is a 44 y.o. male with  Principal Problem:   Cellulitis Active Problems:   IV drug user  Subjective: Less dental pain and feels like his abd is better.  No diarrhea  Abtx:  Anti-infectives (From admission, onward)   Start     Dose/Rate Route Frequency Ordered Stop   07/26/20 1400  clindamycin (CLEOCIN) IVPB 300 mg        300 mg 100 mL/hr over 30 Minutes Intravenous Every 8 hours 07/26/20 1321     07/25/20 1000  vancomycin (VANCOREADY) IVPB 1500 mg/300 mL        1,500 mg 150 mL/hr over 120 Minutes Intravenous Every 12 hours 07/25/20 0736     07/25/20 0200  vancomycin (VANCOCIN) IVPB 1000 mg/200 mL premix  Status:  Discontinued        1,000 mg 200 mL/hr over 60 Minutes Intravenous  Once 07/25/20 0059 07/25/20 0736   07/25/20 0100  vancomycin (VANCOCIN) IVPB 1000 mg/200 mL premix        1,000 mg 200 mL/hr over 60 Minutes Intravenous  Once 07/25/20 0059 07/25/20 0208   07/24/20 2245  clindamycin (CLEOCIN) IVPB 600 mg        600 mg 100 mL/hr over 30 Minutes Intravenous  Once 07/24/20 2244 07/24/20 2341   07/24/20 1915  vancomycin (VANCOCIN) IVPB 1000 mg/200 mL premix        1,000 mg 200 mL/hr over 60 Minutes Intravenous  Once 07/24/20 1914 07/24/20 2037      Medications:  Scheduled: . amLODipine  5 mg Oral Daily  . chlorhexidine  15 mL Mouth/Throat QID  . Chlorhexidine Gluconate Cloth  6 each Topical Q0600  . FLUoxetine  20 mg Oral Daily  . gabapentin  900 mg Oral BID  . hepatitis A virus (PF) vaccine  1 mL Intramuscular Once  . hepatitis b vaccine for adults  1 mL Intramuscular Once  . hydrOXYzine  100 mg Oral TID  . LORazepam  1 mg Intravenous On Call  . LORazepam  1 mg Oral QHS  . mupirocin ointment  1 application Nasal BID    Objective: Vital signs in last 24 hours: Temp:  [98.2 F (36.8 C)-99.8 F (37.7 C)] 98.2 F (36.8 C) (09/12 0826) Pulse Rate:  [58-72] 72 (09/12 0826) Resp:  [15-18] 15 (09/12  0826) BP: (114-159)/(82-94) 147/90 (09/12 0826) SpO2:  [94 %-99 %] 94 % (09/12 0826)   General appearance: alert, cooperative and no distress Resp: clear to auscultation bilaterally Cardio: regular rate and rhythm GI: normal findings: bowel sounds normal, soft, non-tender and wound with less erythema, no d/c, continued crusting.  Lab Results Recent Labs    07/24/20 1836 07/25/20 1403  WBC 12.9* 9.8  HGB 12.3* 11.0*  HCT 38.8* 34.9*  NA 135 136  K 3.5 3.9  CL 100 101  CO2 24 25  BUN 13 8  CREATININE 1.10 0.96   Liver Panel Recent Labs    07/24/20 1836 07/25/20 1403  PROT 9.5* 8.1  ALBUMIN 4.2 3.5  AST 20 16  ALT 16 14  ALKPHOS 112 109  BILITOT 0.3 0.8   Sedimentation Rate No results for input(s): ESRSEDRATE in the last 72 hours. C-Reactive Protein No results for input(s): CRP in the last 72 hours.  Microbiology: Recent Results (from the past 240 hour(s))  Culture, blood (routine x 2)     Status: Abnormal   Collection Time: 07/24/20  6:37 PM   Specimen:  BLOOD RIGHT ARM  Result Value Ref Range Status   Specimen Description   Final    BLOOD RIGHT ARM Performed at [facility] [place], [address] [place], [place] [address]    Special Requests   Final    BOTTLES DRAWN AEROBIC AND ANAEROBIC Blood Culture adequate volume Performed at [facility] [place], [address] [place], [place] [address]    Culture  Setup Time   Final    GRAM POSITIVE COCCI IN CLUSTERS ANAEROBIC BOTTLE ONLY CRITICAL RESULT CALLED TO, READ BACK BY AND VERIFIED WITHCandiss Norse: [facility] 2051 07/25/20 A BROWNING Performed at [facility], [address] [place], [place] [address]    Culture STAPHYLOCOCCUS AUREUS (A)  Final   Report Status 07/27/2020 FINAL  Final   Organism ID, Bacteria STAPHYLOCOCCUS AUREUS  Final      Susceptibility   Staphylococcus aureus - MIC*    CIPROFLOXACIN <=0.5 SENSITIVE Sensitive     ERYTHROMYCIN <=0.25 SENSITIVE Sensitive     GENTAMICIN  <=0.5 SENSITIVE Sensitive     OXACILLIN 0.5 SENSITIVE Sensitive     TETRACYCLINE <=1 SENSITIVE Sensitive     VANCOMYCIN 1 SENSITIVE Sensitive     TRIMETH/SULFA <=10 SENSITIVE Sensitive     CLINDAMYCIN <=0.25 SENSITIVE Sensitive     RIFAMPIN <=0.5 SENSITIVE Sensitive     Inducible Clindamycin NEGATIVE Sensitive     * STAPHYLOCOCCUS AUREUS  Blood Culture ID Panel (Reflexed)     Status: Abnormal   Collection Time: 07/24/20  6:37 PM  Result Value Ref Range Status   Enterococcus faecalis NOT DETECTED NOT DETECTED Final   Enterococcus Faecium NOT DETECTED NOT DETECTED Final   Listeria monocytogenes NOT DETECTED NOT DETECTED Final   Staphylococcus species DETECTED (A) NOT DETECTED Final    Comment: CRITICAL RESULT CALLED TO, READ BACK BY AND VERIFIED WITH: [facility] 2051 07/25/20 A BROWNING    Staphylococcus aureus (BCID) DETECTED (A) NOT DETECTED Final    Comment: CRITICAL RESULT CALLED TO, READ BACK BY AND VERIFIED WITH: [facility] PHARMD 2051 07/25/20 A BROWNING    Staphylococcus epidermidis NOT DETECTED NOT DETECTED Final   Staphylococcus lugdunensis NOT DETECTED NOT DETECTED Final   Streptococcus species NOT DETECTED NOT DETECTED Final   Streptococcus agalactiae NOT DETECTED NOT DETECTED Final   Streptococcus pneumoniae NOT DETECTED NOT DETECTED Final   Streptococcus pyogenes NOT DETECTED NOT DETECTED Final   A.calcoaceticus-baumannii NOT DETECTED NOT DETECTED Final   Bacteroides fragilis NOT DETECTED NOT DETECTED Final   Enterobacterales NOT DETECTED NOT DETECTED Final   Enterobacter cloacae complex NOT DETECTED NOT DETECTED Final   Escherichia coli NOT DETECTED NOT DETECTED Final   Klebsiella aerogenes NOT DETECTED NOT DETECTED Final   Klebsiella oxytoca NOT DETECTED NOT DETECTED Final   Klebsiella pneumoniae NOT DETECTED NOT DETECTED Final   Proteus species NOT DETECTED NOT DETECTED Final   Salmonella species NOT DETECTED NOT DETECTED Final   Serratia marcescens NOT  DETECTED NOT DETECTED Final   Haemophilus influenzae NOT DETECTED NOT DETECTED Final   Neisseria meningitidis NOT DETECTED NOT DETECTED Final   Pseudomonas aeruginosa NOT DETECTED NOT DETECTED Final   Stenotrophomonas maltophilia NOT DETECTED NOT DETECTED Final   Candida albicans NOT DETECTED NOT DETECTED Final   Candida auris NOT DETECTED NOT DETECTED Final   Candida glabrata NOT DETECTED NOT DETECTED Final   Candida krusei NOT DETECTED NOT DETECTED Final   Candida parapsilosis NOT DETECTED NOT DETECTED Final  Candida tropicalis NOT DETECTED NOT DETECTED Final   Cryptococcus neoformans/gattii NOT DETECTED NOT DETECTED Final   Meth resistant mecA/C and MREJ NOT DETECTED NOT DETECTED Final    Comment: Performed at [facility], [address], [place], [place] [address]  SARS Coronavirus 2 by RT PCR (hospital order, performed in [facility] hospital lab) Nasopharyngeal Peripheral     Status: None   Collection Time: 07/24/20  7:28 PM   Specimen: Peripheral; Nasopharyngeal  Result Value Ref Range Status   SARS Coronavirus 2 NEGATIVE NEGATIVE Final    Comment: (NOTE) SARS-CoV-2 target nucleic acids are NOT DETECTED.  The SARS-CoV-2 RNA is generally detectable in upper and lower respiratory specimens during the acute phase of infection. The lowest concentration of SARS-CoV-2 viral copies this assay can detect is 250 copies / mL. A negative result does not preclude SARS-CoV-2 infection and should not be used as the sole basis for treatment or other patient management decisions.  A negative result may occur with improper specimen collection / handling, submission of specimen other than nasopharyngeal swab, presence of viral mutation(s) within the areas targeted by this assay, and inadequate number of viral copies (<250 copies / mL). A negative result must be combined with clinical observations, patient history, and epidemiological information.  Fact Sheet for Patients:     BoilerBrush.com.cy  Fact Sheet for Healthcare Providers: https://pope.com/  This test is not yet approved or  cleared by the [place] FDA and has been authorized for detection and/or diagnosis of SARS-CoV-2 by FDA under an Emergency Use Authorization (EUA).  This EUA will remain in effect (meaning this test can be used) for the duration of the COVID-19 declaration under Section 564(b)(1) of the Act, 21 U.S.C. section 360bbb-3(b)(1), unless the authorization is terminated or revoked sooner.  Performed at [facility], [address] [place], [place] [address]   MRSA PCR Screening     Status: Abnormal   Collection Time: 07/25/20  4:21 AM   Specimen: Nasopharyngeal  Result Value Ref Range Status   MRSA by PCR POSITIVE (A) NEGATIVE Final    Comment:        The GeneXpert MRSA Assay (FDA approved for NASAL specimens only), is one component of a comprehensive MRSA colonization surveillance program. It is not intended to diagnose MRSA infection nor to guide or monitor treatment for MRSA infections. RESULT CALLED TO, READ BACK BY AND VERIFIED WITH: RN Jasmine December [facility] 5364 [phone] FCP Performed at [facility], [address], [place], [place] [address]     Studies/Results: ECHOCARDIOGRAM COMPLETE  Result Date: 07/27/2020    ECHOCARDIOGRAM REPORT   Patient Name:   JAXSYN AZAM Date of Exam: 07/26/2020 Medical Rec #:  [id number]       Height:       66.0 in Accession #:    [id number]      Weight:       318.2 lb Date of Birth:  1976-01-06       BSA:          2.436 m Patient Age:    43 years        BP:           114/84 mmHg Patient Gender: M               HR:           63 bpm. Exam Location:  Inpatient Procedure: 2D Echo Indications:    bacteremia 790.7  History:  Patient has prior history of Echocardiogram examinations, most                 recent 08/18/2017. Risk Factors:IV drug use.  Sonographer:    Delcie Roch Referring Phys: [id number] SAIMA RIZWAN  Sonographer Comments: Image acquisition challenging due to patient body habitus. IMPRESSIONS  1. Left ventricular ejection fraction, by estimation, is 60 to 65%. The left ventricle has normal function. The left ventricle has no regional wall motion abnormalities. The left ventricular internal cavity size was mildly dilated. Left ventricular diastolic parameters were normal.  2. Right ventricular systolic function is normal. The right ventricular size is normal. Tricuspid regurgitation signal is inadequate for assessing PA pressure.  3. The mitral valve is normal in structure. No evidence of mitral valve regurgitation. No evidence of mitral stenosis.  4. The aortic valve is normal in structure. Aortic valve regurgitation is not visualized. No aortic stenosis is present.  5. Aortic dilatation noted. There is mild dilatation of the aortic root, measuring 39 mm. There is borderline dilatation of the ascending aorta, measuring 37 mm.  6. The inferior vena cava is dilated in size with >50% respiratory variability, suggesting right atrial pressure of 8 mmHg. FINDINGS  Left Ventricle: Left ventricular ejection fraction, by estimation, is 60 to 65%. The left ventricle has normal function. The left ventricle has no regional wall motion abnormalities. The left ventricular internal cavity size was mildly dilated. There is  no left ventricular hypertrophy. Left ventricular diastolic parameters were normal. Normal left ventricular filling pressure. Right Ventricle: The right ventricular size is normal. No increase in right ventricular wall thickness. Right ventricular systolic function is normal. Tricuspid regurgitation signal is inadequate for assessing PA pressure. Left Atrium: Left atrial size was normal in size. Right Atrium: Right atrial size was normal in size. Pericardium: There is no evidence of pericardial effusion. Mitral Valve: The mitral valve is normal in structure. No  evidence of mitral valve regurgitation. No evidence of mitral valve stenosis. Tricuspid Valve: The tricuspid valve is normal in structure. Tricuspid valve regurgitation is not demonstrated. No evidence of tricuspid stenosis. Aortic Valve: The aortic valve is normal in structure. Aortic valve regurgitation is not visualized. No aortic stenosis is present. Pulmonic Valve: The pulmonic valve was normal in structure. Pulmonic valve regurgitation is not visualized. No evidence of pulmonic stenosis. Aorta: Aortic dilatation noted. There is mild dilatation of the aortic root, measuring 39 mm. There is borderline dilatation of the ascending aorta, measuring 37 mm. Venous: The inferior vena cava is dilated in size with greater than 50% respiratory variability, suggesting right atrial pressure of 8 mmHg. IAS/Shunts: No atrial level shunt detected by color flow Doppler.  LEFT VENTRICLE PLAX 2D LVIDd:         5.60 cm  Diastology LVIDs:         3.60 cm  LV e' medial:    8.05 cm/s LV PW:         1.10 cm  LV E/e' medial:  12.0 LV IVS:        1.20 cm  LV e' lateral:   13.60 cm/s LVOT diam:     2.30 cm  LV E/e' lateral: 7.1 LV SV:         124 LV SV Index:   51 LVOT Area:     4.15 cm  RIGHT VENTRICLE RV S prime:     15.70 cm/s TAPSE (M-mode): 2.5 cm LEFT ATRIUM  Index       RIGHT ATRIUM           Index LA diam:        4.10 cm 1.68 cm/m  RA Area:     18.60 cm LA Vol (A2C):   50.0 ml 20.52 ml/m RA Volume:   49.90 ml  20.48 ml/m LA Vol (A4C):   48.2 ml 19.79 ml/m LA Biplane Vol: 50.2 ml 20.61 ml/m  AORTIC VALVE LVOT Vmax:   159.00 cm/s LVOT Vmean:  90.200 cm/s LVOT VTI:    0.298 m  AORTA Ao Root diam: 3.90 cm Ao Asc diam:  3.70 cm MITRAL VALVE MV Area (PHT): 3.42 cm    SHUNTS MV Decel Time: 222 msec    Systemic VTI:  0.30 m MV E velocity: 96.30 cm/s  Systemic Diam: 2.30 cm MV A velocity: 90.30 cm/s MV E/A ratio:  1.07 Armanda Magic MD Electronically signed by Armanda Magic MD Signature Date/Time: 07/27/2020/9:06:45 AM     Final      Assessment/Plan: Staph bacteremia IVDA Cellulitis, abd wall wound Dental space infection 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   Total days of antibiotics: 2 vanco/clinda  MRI hip, spine pending.  Continue his current anbx Await his TTE, suggest TEE also Prev on suboxonne Start Hep A and B vax Check Hep C RNA Repeat BCx 9-11/9-12 pending.  Please ask dental to eval in AM My great appreciation to the outstanding care provided by [facility]         Johny Sax MD, FACP Infectious Diseases (pager) [phone] [email or web address] 07/27/2020, 1:48 PM  LOS: 2 days

## 2020-07-27 NOTE — Progress Notes (Signed)
PROGRESS NOTE    Mohmmad Saleeby   MEQ:[id number]  DOB: 1976/08/18  DOA: 07/24/2020 PCP: Patient, No Pcp Per   Brief Narrative:  Callan Norden is a 44 year old male with history of IV drug use, severe morbid obesity, essential hypertension, OSA, history of osteomyelitis spine, tobacco use, dental caries, hepatitis C, chronic pain syndrome presented with right-sided facial swelling and severe pain. He has a h/o of IV drug abuse and states the pain in his face resulted in a relapse.  Subjective: Pain and swelling in face continues to improve. Has acute on chronic pain in lower back and right hip.     Assessment & Plan:   Principal Problem:   Cellulitis with staph aureus bacteremia -cellulitis of left face - initially thought to be related to dental carries but blood cultures from 9/9 positive for staph- facial swelling improving - yesterday ID placed him back on Clinda - still continues to have some swelling of the face especially under left eye but thinks he can eat solid food- will advance to soft diet - cont to follow sensitivities to determine if this is MRSA -  BCID suggests it is MSSA but PCR for MRSA is + - he will likely need a TEE-  - f/u on TTE which has been done today - abdominal wound was likely an abscess and is now healing well- will order nonstick dressing to wound  -  f/u repeat blood cultures - cont Vanc - due to back and right hip pain and prior such infections, will obtain imaging of lumbar spine and right hip (MRI)   Active Problems:   IV drug user - has been counseled - UDS revealed opiates and cocaine   B/l arm swelling - likely related to poor vasculature related to chronic IV drug use  HTN - Amlodipine  Time spent in minutes: 35 DVT prophylaxis: SCDs Code Status: full code Family Communication:  Disposition Plan:  Status is: Inpatient  Remains inpatient appropriate because:IV antibiotic treatment   Dispo: The patient is from: Home               Anticipated d/c is to: Home              Anticipated d/c date is: > 3 days              Patient currently is not medically stable to d/c.   Consultants:   ID Procedures:   none Antimicrobials:  Anti-infectives (From admission, onward)   Start     Dose/Rate Route Frequency Ordered Stop   07/26/20 1400  clindamycin (CLEOCIN) IVPB 300 mg        300 mg 100 mL/hr over 30 Minutes Intravenous Every 8 hours 07/26/20 1321     07/25/20 1000  vancomycin (VANCOREADY) IVPB 1500 mg/300 mL        1,500 mg 150 mL/hr over 120 Minutes Intravenous Every 12 hours 07/25/20 0736     07/25/20 0200  vancomycin (VANCOCIN) IVPB 1000 mg/200 mL premix  Status:  Discontinued        1,000 mg 200 mL/hr over 60 Minutes Intravenous  Once 07/25/20 0059 07/25/20 0736   07/25/20 0100  vancomycin (VANCOCIN) IVPB 1000 mg/200 mL premix        1,000 mg 200 mL/hr over 60 Minutes Intravenous  Once 07/25/20 0059 07/25/20 0208   07/24/20 2245  clindamycin (CLEOCIN) IVPB 600 mg        600 mg 100 mL/hr over 30 Minutes Intravenous  Once 07/24/20  2244 07/24/20 2341   07/24/20 1915  vancomycin (VANCOCIN) IVPB 1000 mg/200 mL premix        1,000 mg 200 mL/hr over 60 Minutes Intravenous  Once 07/24/20 1914 07/24/20 2037       Objective: Vitals:   07/26/20 1505 07/26/20 2010 07/27/20 0559 07/27/20 0826  BP: 114/84 (!) 159/82 (!) 151/94 (!) 147/90  Pulse: (!) 58 72 66 72  Resp: 17 18 16 15   Temp: 98.3 F (36.8 C) 99.8 F (37.7 C) 98.6 F (37 C) 98.2 F (36.8 C)  TempSrc: Oral Oral Oral Oral  SpO2: 96% 94% 99% 94%  Weight:      Height:        Intake/Output Summary (Last 24 hours) at 07/27/2020 1147 Last data filed at 07/27/2020 0600 Gross per 24 hour  Intake 1069.77 ml  Output 900 ml  Net 169.77 ml   Filed Weights   07/24/20 1811  Weight: (!) 144.3 kg    Examination: General exam: Appears comfortable  HEENT: PERRLA, oral mucosa moist, no sclera icterus or thrush- mild right maxillary swelling  today Respiratory system: Clear to auscultation. Respiratory effort normal. Cardiovascular system: S1 & S2 heard,  No murmurs  Gastrointestinal system: Abdomen soft, non-tender, nondistended. Normal bowel sounds  Central nervous system: Alert and oriented. No focal neurological deficits. Extremities: No cyanosis, clubbing or edema Skin: ulcer on abdomen appears to be healing Psychiatry:  Mood & affect appropriate.     Data Reviewed: I have personally reviewed following labs and imaging studies  CBC: Recent Labs  Lab 07/24/20 1836 07/25/20 1403  WBC 12.9* 9.8  NEUTROABS 10.0* 6.6  HGB 12.3* 11.0*  HCT 38.8* 34.9*  MCV 76.4* 77.6*  PLT 465* 406*   Basic Metabolic Panel: Recent Labs  Lab 07/24/20 1836 07/25/20 1403  NA 135 136  K 3.5 3.9  CL 100 101  CO2 24 25  GLUCOSE 137* 101*  BUN 13 8  CREATININE 1.10 0.96  CALCIUM 9.4 9.2   GFR: Estimated Creatinine Clearance: 137.5 mL/min (by C-G formula based on SCr of 0.96 mg/dL). Liver Function Tests: Recent Labs  Lab 07/24/20 1836 07/25/20 1403  AST 20 16  ALT 16 14  ALKPHOS 112 109  BILITOT 0.3 0.8  PROT 9.5* 8.1  ALBUMIN 4.2 3.5   No results for input(s): LIPASE, AMYLASE in the last 168 hours. No results for input(s): AMMONIA in the last 168 hours. Coagulation Profile: No results for input(s): INR, PROTIME in the last 168 hours. Cardiac Enzymes: No results for input(s): CKTOTAL, CKMB, CKMBINDEX, TROPONINI in the last 168 hours. BNP (last 3 results) No results for input(s): PROBNP in the last 8760 hours. HbA1C: No results for input(s): HGBA1C in the last 72 hours. CBG: No results for input(s): GLUCAP in the last 168 hours. Lipid Profile: No results for input(s): CHOL, HDL, LDLCALC, TRIG, CHOLHDL, LDLDIRECT in the last 72 hours. Thyroid Function Tests: No results for input(s): TSH, T4TOTAL, FREET4, T3FREE, THYROIDAB in the last 72 hours. Anemia Panel: No results for input(s): VITAMINB12, FOLATE, FERRITIN,  TIBC, IRON, RETICCTPCT in the last 72 hours. Urine analysis:    Component Value Date/Time   COLORURINE YELLOW 07/24/2020 1928   APPEARANCEUR CLOUDY (A) 07/24/2020 1928   LABSPEC >1.030 (H) 07/24/2020 1928   PHURINE 6.0 07/24/2020 1928   GLUCOSEU NEGATIVE 07/24/2020 1928   HGBUR NEGATIVE 07/24/2020 1928   BILIRUBINUR SMALL (A) 07/24/2020 1928   KETONESUR NEGATIVE 07/24/2020 1928   PROTEINUR 30 (A) 07/24/2020 1928  NITRITE NEGATIVE 07/24/2020 1928   LEUKOCYTESUR NEGATIVE 07/24/2020 1928   Sepsis Labs: @LABRCNTIP (procalcitonin:4,lacticidven:4) ) Recent Results (from the past 240 hour(s))  Culture, blood (routine x 2)     Status: Abnormal   Collection Time: 07/24/20  6:37 PM   Specimen: BLOOD RIGHT ARM  Result Value Ref Range Status   Specimen Description   Final    BLOOD RIGHT ARM Performed at [facility], [address] [place], [place]    Special Requests   Final    BOTTLES DRAWN AEROBIC AND ANAEROBIC Blood Culture adequate volume Performed at [facility], [address] [place], [place]    Culture  Setup Time   Final    GRAM POSITIVE COCCI IN CLUSTERS ANAEROBIC BOTTLE ONLY CRITICAL RESULT CALLED TO, READ BACK BY AND VERIFIED [id number] [facility] 2051 07/25/20 A BROWNING Performed at [facility], [address], [place], [place]    Culture STAPHYLOCOCCUS AUREUS (A)  Final   Report Status 07/27/2020 FINAL  Final   Organism ID, Bacteria STAPHYLOCOCCUS AUREUS  Final      Susceptibility   Staphylococcus aureus - MIC*    CIPROFLOXACIN <=0.5 SENSITIVE Sensitive     ERYTHROMYCIN <=0.25 SENSITIVE Sensitive     GENTAMICIN <=0.5 SENSITIVE Sensitive     OXACILLIN 0.5 SENSITIVE Sensitive     TETRACYCLINE <=1 SENSITIVE Sensitive     VANCOMYCIN 1 SENSITIVE Sensitive     TRIMETH/SULFA <=10 SENSITIVE Sensitive     CLINDAMYCIN <=0.25 SENSITIVE Sensitive     RIFAMPIN <=0.5 SENSITIVE Sensitive     Inducible  Clindamycin NEGATIVE Sensitive     * STAPHYLOCOCCUS AUREUS  Blood Culture ID Panel (Reflexed)     Status: Abnormal   Collection Time: 07/24/20  6:37 PM  Result Value Ref Range Status   Enterococcus faecalis NOT DETECTED NOT DETECTED Final   Enterococcus Faecium NOT DETECTED NOT DETECTED Final   Listeria monocytogenes NOT DETECTED NOT DETECTED Final   Staphylococcus species DETECTED (A) NOT DETECTED Final    Comment: CRITICAL RESULT CALLED TO, READ BACK BY AND VERIFIED WITH: [facility] 2051 07/25/20 A BROWNING    Staphylococcus aureus (BCID) DETECTED (A) NOT DETECTED Final    Comment: CRITICAL RESULT CALLED TO, READ BACK BY AND VERIFIED WITH: H VON [facility] PHAR[facility] 2051 07/25/20 A BROWNING    Staphylococcus epidermidis NOT DETECTED NOT DETECTED Final   Staphylococcus lugdunensis NOT DETECTED NOT DETECTED Final   Streptococcus species NOT DETECTED NOT DETECTED Final   Streptococcus agalactiae NOT DETECTED NOT DETECTED Final   Streptococcus pneumoniae NOT DETECTED NOT DETECTED Final   Streptococcus pyogenes NOT DETECTED NOT DETECTED Final   A.calcoaceticus-baumannii NOT DETECTED NOT DETECTED Final   Bacteroides fragilis NOT DETECTED NOT DETECTED Final   Enterobacterales NOT DETECTED NOT DETECTED Final   Enterobacter cloacae complex NOT DETECTED NOT DETECTED Final   Escherichia coli NOT DETECTED NOT DETECTED Final   Klebsiella aerogenes NOT DETECTED NOT DETECTED Final   Klebsiella oxytoca NOT DETECTED NOT DETECTED Final   Klebsiella pneumoniae NOT DETECTED NOT DETECTED Final   Proteus species NOT DETECTED NOT DETECTED Final   Salmonella species NOT DETECTED NOT DETECTED Final   Serratia marcescens NOT DETECTED NOT DETECTED Final   Haemophilus influenzae NOT DETECTED NOT DETECTED Final   Neisseria meningitidis NOT DETECTED NOT DETECTED Final   Pseudomonas aeruginosa NOT DETECTED NOT DETECTED Final   Stenotrophomonas maltophilia NOT DETECTED NOT DETECTED Final  Candida albicans  NOT DETECTED NOT DETECTED Final   Candida auris NOT DETECTED NOT DETECTED Final   Candida glabrata NOT DETECTED NOT DETECTED Final   Candida krusei NOT DETECTED NOT DETECTED Final   Candida parapsilosis NOT DETECTED NOT DETECTED Final   Candida tropicalis NOT DETECTED NOT DETECTED Final   Cryptococcus neoformans/gattii NOT DETECTED NOT DETECTED Final   Meth resistant mecA/C and MREJ NOT DETECTED NOT DETECTED Final    Comment: Performed at [facility], [address], [place], [place] [address]  SARS Coronavirus 2 by RT PCR (hospital order, performed in [facility] hospital lab) Nasopharyngeal Peripheral     Status: None   Collection Time: 07/24/20  7:28 PM   Specimen: Peripheral; Nasopharyngeal  Result Value Ref Range Status   SARS Coronavirus 2 NEGATIVE NEGATIVE Final    Comment: (NOTE) SARS-CoV-2 target nucleic acids are NOT DETECTED.  The SARS-CoV-2 RNA is generally detectable in upper and lower respiratory specimens during the acute phase of infection. The lowest concentration of SARS-CoV-2 viral copies this assay can detect is 250 copies / mL. A negative result does not preclude SARS-CoV-2 infection and should not be used as the sole basis for treatment or other patient management decisions.  A negative result may occur with improper specimen collection / handling, submission of specimen other than nasopharyngeal swab, presence of viral mutation(s) within the areas targeted by this assay, and inadequate number of viral copies (<250 copies / mL). A negative result must be combined with clinical observations, patient history, and epidemiological information.  Fact Sheet for Patients:   BoilerBrush.com.cy  Fact Sheet for Healthcare Providers: https://pope.com/  This test is not yet approved or  cleared by the [place] FDA and has been authorized for detection and/or diagnosis of SARS-CoV-2 by FDA under an Emergency Use  Authorization (EUA).  This EUA will remain in effect (meaning this test can be used) for the duration of the COVID-19 declaration under Section 564(b)(1) of the Act, 21 U.S.C. section 360bbb-3(b)(1), unless the authorization is terminated or revoked sooner.  Performed at [facility], [address] [place], [place] [address]   MRSA PCR Screening     Status: Abnormal   Collection Time: 07/25/20  4:21 AM   Specimen: Nasopharyngeal  Result Value Ref Range Status   MRSA by PCR POSITIVE (A) NEGATIVE Final    Comment:        The GeneXpert MRSA Assay (FDA approved for NASAL specimens only), is one component of a comprehensive MRSA colonization surveillance program. It is not intended to diagnose MRSA infection nor to guide or monitor treatment for MRSA infections. RESULT CALLED TO, READ BACK BY AND VERIFIED WITH: RN Jasmine December [facility] 5621 [phone] FCP Performed at [facility], [address] [place], [place] [address]          Radiology Studies: ECHOCARDIOGRAM COMPLETE  Result Date: 07/27/2020    ECHOCARDIOGRAM REPORT   Patient Name:   SLAYDE BRAULT Date of Exam: 07/26/2020 Medical Rec #:  [id number]       Height:       66.0 in Accession #:    [id number]      Weight:       318.2 lb Date of Birth:  September 26, 1976       BSA:          2.436 m Patient Age:    43 years        BP:  114/84 mmHg Patient Gender: M               HR:           63 bpm. Exam Location:  Inpatient Procedure: 2D Echo Indications:    bacteremia 790.7  History:        Patient has prior history of Echocardiogram examinations, most                 recent 08/18/2017. Risk Factors:IV drug use.  Sonographer:    Delcie Roch Referring Phys: [id number] Donaldo Teegarden  Sonographer Comments: Image acquisition challenging due to patient body habitus. IMPRESSIONS  1. Left ventricular ejection fraction, by estimation, is 60 to 65%. The left ventricle has normal function. The left ventricle has no regional wall  motion abnormalities. The left ventricular internal cavity size was mildly dilated. Left ventricular diastolic parameters were normal.  2. Right ventricular systolic function is normal. The right ventricular size is normal. Tricuspid regurgitation signal is inadequate for assessing PA pressure.  3. The mitral valve is normal in structure. No evidence of mitral valve regurgitation. No evidence of mitral stenosis.  4. The aortic valve is normal in structure. Aortic valve regurgitation is not visualized. No aortic stenosis is present.  5. Aortic dilatation noted. There is mild dilatation of the aortic root, measuring 39 mm. There is borderline dilatation of the ascending aorta, measuring 37 mm.  6. The inferior vena cava is dilated in size with >50% respiratory variability, suggesting right atrial pressure of 8 mmHg. FINDINGS  Left Ventricle: Left ventricular ejection fraction, by estimation, is 60 to 65%. The left ventricle has normal function. The left ventricle has no regional wall motion abnormalities. The left ventricular internal cavity size was mildly dilated. There is  no left ventricular hypertrophy. Left ventricular diastolic parameters were normal. Normal left ventricular filling pressure. Right Ventricle: The right ventricular size is normal. No increase in right ventricular wall thickness. Right ventricular systolic function is normal. Tricuspid regurgitation signal is inadequate for assessing PA pressure. Left Atrium: Left atrial size was normal in size. Right Atrium: Right atrial size was normal in size. Pericardium: There is no evidence of pericardial effusion. Mitral Valve: The mitral valve is normal in structure. No evidence of mitral valve regurgitation. No evidence of mitral valve stenosis. Tricuspid Valve: The tricuspid valve is normal in structure. Tricuspid valve regurgitation is not demonstrated. No evidence of tricuspid stenosis. Aortic Valve: The aortic valve is normal in structure. Aortic  valve regurgitation is not visualized. No aortic stenosis is present. Pulmonic Valve: The pulmonic valve was normal in structure. Pulmonic valve regurgitation is not visualized. No evidence of pulmonic stenosis. Aorta: Aortic dilatation noted. There is mild dilatation of the aortic root, measuring 39 mm. There is borderline dilatation of the ascending aorta, measuring 37 mm. Venous: The inferior vena cava is dilated in size with greater than 50% respiratory variability, suggesting right atrial pressure of 8 mmHg. IAS/Shunts: No atrial level shunt detected by color flow Doppler.  LEFT VENTRICLE PLAX 2D LVIDd:         5.60 cm  Diastology LVIDs:         3.60 cm  LV e' medial:    8.05 cm/s LV PW:         1.10 cm  LV E/e' medial:  12.0 LV IVS:        1.20 cm  LV e' lateral:   13.60 cm/s LVOT diam:     2.30 cm  LV E/e'  lateral: 7.1 LV SV:         124 LV SV Index:   51 LVOT Area:     4.15 cm  RIGHT VENTRICLE RV S prime:     15.70 cm/s TAPSE (M-mode): 2.5 cm LEFT ATRIUM             Index       RIGHT ATRIUM           Index LA diam:        4.10 cm 1.68 cm/m  RA Area:     18.60 cm LA Vol (A2C):   50.0 ml 20.52 ml/m RA Volume:   49.90 ml  20.48 ml/m LA Vol (A4C):   48.2 ml 19.79 ml/m LA Biplane Vol: 50.2 ml 20.61 ml/m  AORTIC VALVE LVOT Vmax:   159.00 cm/s LVOT Vmean:  90.200 cm/s LVOT VTI:    0.298 m  AORTA Ao Root diam: 3.90 cm Ao Asc diam:  3.70 cm MITRAL VALVE MV Area (PHT): 3.42 cm    SHUNTS MV Decel Time: 222 msec    Systemic VTI:  0.30 m MV E velocity: 96.30 cm/s  Systemic Diam: 2.30 cm MV A velocity: 90.30 cm/s MV E/A ratio:  1.07 Armanda Magic MD Electronically signed by Armanda Magic MD Signature Date/Time: 07/27/2020/9:06:45 AM    Final    VAS [place] UPPER EXTREMITY VENOUS DUPLEX  Result Date: 07/25/2020 UPPER VENOUS STUDY  Indications: Swelling, and Edema Risk Factors: IV Drug Use. Performing Technologist: Jannet Askew RCT RDMS  Examination Guidelines: A complete evaluation includes B-mode imaging, spectral  Doppler, color Doppler, and power Doppler as needed of all accessible portions of each vessel. Bilateral testing is considered an integral part of a complete examination. Limited examinations for reoccurring indications may be performed as noted.  Right Findings: +----------+------------+---------+-----------+----------+-------+ RIGHT     CompressiblePhasicitySpontaneousPropertiesSummary +----------+------------+---------+-----------+----------+-------+ IJV           Full       Yes       Yes                      +----------+------------+---------+-----------+----------+-------+ Subclavian    Full       Yes       Yes                      +----------+------------+---------+-----------+----------+-------+ Axillary      Full       Yes       Yes                      +----------+------------+---------+-----------+----------+-------+ Brachial      Full       Yes       Yes                      +----------+------------+---------+-----------+----------+-------+ Radial        Full                                          +----------+------------+---------+-----------+----------+-------+ Ulnar         Full                                          +----------+------------+---------+-----------+----------+-------+ Cephalic  Full                                          +----------+------------+---------+-----------+----------+-------+ Basilic       Full                                          +----------+------------+---------+-----------+----------+-------+  Left Findings: +----------+------------+---------+-----------+----------+-------+ LEFT      CompressiblePhasicitySpontaneousPropertiesSummary +----------+------------+---------+-----------+----------+-------+ IJV           Full       Yes       Yes                      +----------+------------+---------+-----------+----------+-------+ Subclavian    Full       Yes       Yes                       +----------+------------+---------+-----------+----------+-------+  Summary:  Right: No evidence of deep vein thrombosis in the upper extremity. No evidence of superficial vein thrombosis in the upper extremity.  Left: No evidence of thrombosis in the subclavian.  *See table(s) above for measurements and observations.  Diagnosing physician: Sherald Hess MD Electronically signed by Sherald Hess MD on 07/25/2020 at 4:42:55 PM.    Final       Scheduled Meds: . amLODipine  5 mg Oral Daily  . chlorhexidine  15 mL Mouth/Throat QID  . Chlorhexidine Gluconate Cloth  6 each Topical Q0600  . FLUoxetine  20 mg Oral Daily  . gabapentin  900 mg Oral BID  . hydrOXYzine  100 mg Oral TID  . LORazepam  1 mg Intravenous On Call  . LORazepam  1 mg Oral QHS  . mupirocin ointment  1 application Nasal BID   Continuous Infusions: . sodium chloride 10 mL/hr at 07/25/20 0107  . clindamycin (CLEOCIN) IV 300 mg (07/27/20 0238)  . vancomycin 1,500 mg (07/27/20 1111)     LOS: 2 days      Calvert Cantor, MD [facility] Pager: [email or web address] 07/27/2020, 11:47 AM

## 2020-07-27 NOTE — Progress Notes (Signed)
Pharmacy Antibiotic Note  Joshua Jacobs is a 44 y.o. male admitted on 07/24/2020 with cellulitis.  Pharmacy has been consulted for vancomycin dosing.  Vancomycin trough within goal at 12 mcg/ml. WBC improve to 9.8, afebrile, creatinine improve to 0.96 from 1.10.  Plan: - Continue vancomycin IV 1500mg  IV q12h - Monitor renal function and clinical progress - Follow-up cultures, imaging studies and de-escalation plans    Height: 5\' 6"  (167.6 cm) Weight: (!) 144.3 kg (318 lb 3.2 oz) IBW/kg (Calculated) : 63.8  Temp (24hrs), Avg:98.7 F (37.1 C), Min:98.2 F (36.8 C), Max:99.8 F (37.7 C)  Recent Labs  Lab 07/24/20 1836 07/25/20 1403 07/27/20 1112  WBC 12.9* 9.8  --   CREATININE 1.10 0.96  --   LATICACIDVEN 1.4  --   --   VANCOTROUGH  --   --  12*    Estimated Creatinine Clearance: 137.5 mL/min (by C-G formula based on SCr of 0.96 mg/dL).    Allergies  Allergen Reactions  . Penicillins Anaphylaxis    Has patient had a PCN reaction causing immediate rash, facial/tongue/throat swelling, SOB or lightheadedness with hypotension: Yes Has patient had a PCN reaction causing severe rash involving mucus membranes or skin necrosis: No Has patient had a PCN reaction that required hospitalization Yes Has patient had a PCN reaction occurring within the last 10 years: No If all of the above answers are "NO", then may proceed with Cephalosporin  Childhood PCN > described as chest closing/tightness.   09/24/20 Penicillins Anaphylaxis    Has patient had a PCN reaction causing immediate rash, facial/tongue/throat swelling, SOB or lightheadedness with hypotension: No Has patient had a PCN reaction causing severe rash involving mucus membranes or skin necrosis: No Has patient had a PCN reaction that required hospitalization: Yes Has patient had a PCN reaction occurring within the last 10 years: No If all of the above answers are "NO", then may proceed with Cephalosporin use.   Antimicrobials this  admission: Bactroban 9/10 >> (9/15) Vancomycin 9/9 >> Clinda x1 9/9, 9/11 >>  Microbiology results: 9/9 BCx: MSSA 9/10 MRSA PCR: positive 9/12 Repeat BCx x2: sent     Joshua Jacobs L. 11/10, PharmD [facility] PGY2 Pharmacy Resident Weekends 7:00 am - 3:00 pm, please call [phone] 07/27/20      1:05 PM  Please check AMION for all [facility] Pharmacy phone numbers After 10:00 PM, call the Main Pharmacy [phone]

## 2020-07-27 NOTE — Plan of Care (Addendum)
Pt picked up from room for MRI, prior to MRI Ativan given, Pt alert and oriented x4, not in distress, vital signs taken and recorded.  Problem: Education: Goal: Knowledge of General Education information will improve Description: Including pain rating scale, medication(s)/side effects and non-pharmacologic comfort measures Outcome: Progressing   Problem: Clinical Measurements: Goal: Ability to maintain clinical measurements within normal limits will improve Outcome: Progressing   Problem: Activity: Goal: Risk for activity intolerance will decrease Outcome: Progressing   Problem: Nutrition: Goal: Adequate nutrition will be maintained Outcome: Progressing   Problem: Elimination: Goal: Will not experience complications related to bowel motility Outcome: Progressing   Problem: Pain Managment: Goal: General experience of comfort will improve Outcome: Progressing   Problem: Safety: Goal: Ability to remain free from injury will improve Outcome: Progressing   MRI called, verified that Pt is on the list for MRI. Pt updated.

## 2020-07-28 DIAGNOSIS — L03311 Cellulitis of abdominal wall: Secondary | ICD-10-CM | POA: Diagnosis present

## 2020-07-28 DIAGNOSIS — M545 Low back pain, unspecified: Secondary | ICD-10-CM | POA: Diagnosis present

## 2020-07-28 DIAGNOSIS — K047 Periapical abscess without sinus: Secondary | ICD-10-CM

## 2020-07-28 DIAGNOSIS — B1920 Unspecified viral hepatitis C without hepatic coma: Secondary | ICD-10-CM | POA: Diagnosis present

## 2020-07-28 LAB — BLOOD CULTURE ID PANEL (REFLEXED) - BCID2

## 2020-07-28 MED ORDER — CEFAZOLIN SODIUM-DEXTROSE 2-4 GM/100ML-% IV SOLN
2.0000 g | Freq: Three times a day (TID) | INTRAVENOUS | Status: DC
  Administered 2020-07-28 – 2020-08-01 (×12): 2 g via INTRAVENOUS
  Filled 2020-07-28 (×13): qty 100

## 2020-07-28 MED ORDER — CLINDAMYCIN PHOSPHATE 600 MG/50ML IV SOLN
600.0000 mg | Freq: Three times a day (TID) | INTRAVENOUS | Status: DC
  Administered 2020-07-28 – 2020-07-31 (×8): 600 mg via INTRAVENOUS
  Filled 2020-07-28 (×8): qty 50

## 2020-07-28 NOTE — Progress Notes (Signed)
[facility]  Date of Admission:  07/24/2020   Total days of antibiotics 4       Vancomycin    Day 4       Clindamycin Day 3         ASSESSMENT: Joshua Jacobs is a 44 year old male with past medical history of IV drug use, hypertension, OSA, history of osteomyelitis, hepatitis C, who is admitted for facial/abdominal cellulitis and staph bacteremia   PLAN: 1. Staph bacteremia secondary to IV drug use vs cellulitis Blood culture ID panel 07/24/2020 showed Staphylococcus Blood culture 07/24/2020 showed staph aureus pansensitive Blood culture 07/26/2020 showed Staphylococcus capitis, maybe contaminated  Patient is on day 4 of vancomycin day and day 3 of clindamycin.  TTE shows no obvious evidence of vegetation.  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  We will proceed with a TEE for confirmation of endocarditis and determine the duration of antibiotic treatment.  Will narrow down antibiotic regimen to cefazolin.  There is reported anaphylaxis reaction with penicillin, will perform skin test to confirm.    Plan: -Switch to Cefazolin -D/C clindamycin  -Follow up with TEE, scheduled 07/31/20 -Duration of antibiotic regimen depends on the TEE results  2. Left facial cellulitis secondary to dental decay CT maxillofacial 07/24/2020 showed right facial cellulitis likely odontogenic infection source given soup periosteal abscess of right maxillary canine.   Plan: -Continue antibiotics -Dental specialist consult recommended  3. Right lower quadrant cellulitis CT abdomen pelvis consistent with right lower quadrant cellulitis.  No soft tissue air.  No surgery indicated at this time.  Plan: -Continue antibiotics  4. Hepatitis C infection Pending genotype and RNA quant  5. IV drug use I explained to patient the consequences of IV drug use.  Patient states that he would like to avoid IV drug use if he has his chronic pain under  control.  He is no longer seeing his PCP due to lack of insurance.  Will need to work on transitional care after discharge and refer patient to a pain specialist.  Physical therapy may also be beneficial with his chronic hip pain and back pain.   Principal Problem:   Cellulitis Active Problems:   Bacteremia due to Staphylococcus   IV drug user   Hepatitis C infection   Chronic pain syndrome   Pain of both hip joints   Microcytic anemia   Polysubstance dependence (HCC)   Dental caries   Lower back pain   Scheduled Meds: . amLODipine  5 mg Oral Daily  . chlorhexidine  15 mL Mouth/Throat QID  . Chlorhexidine Gluconate Cloth  6 each Topical Q0600  . FLUoxetine  20 mg Oral Daily  . gabapentin  900 mg Oral BID  . hydrOXYzine  100 mg Oral TID  . LORazepam  1 mg Oral QHS  . mupirocin ointment  1 application Nasal BID   Continuous Infusions: . sodium chloride Stopped (07/27/20 1334)  . clindamycin (CLEOCIN) IV 300 mg (07/28/20 1224)  . vancomycin 1,500 mg (07/28/20 0941)   PRN Meds:.sodium chloride, acetaminophen, hydrALAZINE, hydrOXYzine, morphine injection, tiZANidine   SUBJECTIVE: Joshua Jacobs is a 44 year old male with past medical history of IV drug use, hypertension, OSA, history of osteomyelitis, hepatitis C, who is admitted for facial/abdominal cellulitis and staph bacteremia.  Patient is states that he has history of IV drug use, which include heroin and cocaine.  He states that he mainly use drugs for  chronic pain of the lower lumbar and bilateral hips.  Patient used to see his PCP, Dr. Ronne Binning, 2 years ago, where he gets his opioids oxycodone 15 mg 3 times daily.  Patient stopped seeing Dr. Ronne Binning after his PCP is lost his license?  Patient reports 2 months of drug-free but had a relapse 1 day prior to admission due to unbearable pain of bilateral hips low back pain.  Patient states that he will try to avoid IV drug use if he can get his pain under control and start to  work again.  He is currently living with parents.  Patient is seen at bedside.  Appears comfortable no acute distress.  He complains of bilateral hip and low back.  He also complain of right maxillary pain which is tender to palpation.  He denies fevers or chills  Review of Systems: Review of Systems  Constitutional: Negative for chills and fever.  HENT: Negative.  Negative for hearing loss.   Respiratory: Negative.  Negative for shortness of breath.   Cardiovascular: Negative for leg swelling.  Musculoskeletal: Positive for back pain and joint pain (Bilateral hip pain).       Right maxillary pain  Neurological: Negative.     Allergies  Allergen Reactions  . Penicillins Anaphylaxis    Has patient had a PCN reaction causing immediate rash, facial/tongue/throat swelling, SOB or lightheadedness with hypotension: Yes Has patient had a PCN reaction causing severe rash involving mucus membranes or skin necrosis: No Has patient had a PCN reaction that required hospitalization Yes Has patient had a PCN reaction occurring within the last 10 years: No If all of the above answers are "NO", then may proceed with Cephalosporin  Childhood PCN > described as chest closing/tightness.   Marland Kitchen Penicillins Anaphylaxis    Has patient had a PCN reaction causing immediate rash, facial/tongue/throat swelling, SOB or lightheadedness with hypotension: No Has patient had a PCN reaction causing severe rash involving mucus membranes or skin necrosis: No Has patient had a PCN reaction that required hospitalization: Yes Has patient had a PCN reaction occurring within the last 10 years: No If all of the above answers are "NO", then may proceed with Cephalosporin use.    OBJECTIVE: Vitals:   07/27/20 1839 07/27/20 2023 07/28/20 0328 07/28/20 0856  BP: 138/83 126/90 (!) 143/95 136/79  Pulse: 67 77 60 (!) 53  Resp: Temp: 98.3 F (36.8 C) 99.5 F (37.5 C) 98 F (36.7 C) 97.9 F (36.6 C)  TempSrc:  Oral Oral Oral Oral  SpO2: 97% 95% 100% 97%  Weight:      Height:       Body mass index is 51.36 kg/m.  Physical Exam Constitutional:      Appearance: He is obese.  HENT:     Head: Normocephalic.  Eyes:     General: No scleral icterus.       Right eye: No discharge.        Left eye: No discharge.  Cardiovascular:     Rate and Rhythm: Normal rate and regular rhythm.     Heart sounds: No murmur heard.   Pulmonary:     Effort: No respiratory distress.     Breath sounds: Normal breath sounds.  Abdominal:     General: Bowel sounds are normal.  Musculoskeletal:     Cervical back: Normal range of motion.     Right lower leg: No edema.     Left lower leg: No edema.  Comments: Tenderness to palpation of right hip. Tenderness to palpation of right maxillary area  Skin:    General: Skin is warm.     Comments: Multiple track marks noted bilateral forearms. Cellulitis noted on the right side abdomen.  Neurological:     Mental Status: He is alert.  Psychiatric:        Mood and Affect: Mood normal.     Lab Results Lab Results  Component Value Date   WBC 9.8 07/25/2020   HGB 11.0 (L) 07/25/2020   HCT 34.9 (L) 07/25/2020   MCV 77.6 (L) 07/25/2020   PLT 406 (H) 07/25/2020    Lab Results  Component Value Date   CREATININE 0.96 07/25/2020   BUN 8 07/25/2020   NA 136 07/25/2020   K 3.9 07/25/2020   CL 101 07/25/2020   CO2 25 07/25/2020    Lab Results  Component Value Date   ALT 14 07/25/2020   AST 16 07/25/2020   ALKPHOS 109 07/25/2020   BILITOT 0.8 07/25/2020     Microbiology: Recent Results (from the past 240 hour(s))  Culture, blood (routine x 2)     Status: Abnormal   Collection Time: 07/24/20  6:37 PM   Specimen: BLOOD RIGHT ARM  Result Value Ref Range Status   Specimen Description   Final    BLOOD RIGHT ARM Performed at [facility] [place], [address] [place], [place] [address]    Special Requests   Final    BOTTLES DRAWN AEROBIC AND  ANAEROBIC Blood Culture adequate volume Performed at [facility] [place], [address] [place], [place] [address]    Culture  Setup Time   Final    GRAM POSITIVE COCCI IN CLUSTERS ANAEROBIC BOTTLE ONLY CRITICAL RESULT CALLED TO, READ BACK BY AND VERIFIED WITHCandiss Norse: [facility] 2051 07/25/20 A BROWNING Performed at [facility], [address] [place], [place] [address]    Culture STAPHYLOCOCCUS AUREUS (A)  Final   Report Status 07/27/2020 FINAL  Final   Organism ID, Bacteria STAPHYLOCOCCUS AUREUS  Final      Susceptibility   Staphylococcus aureus - MIC*    CIPROFLOXACIN <=0.5 SENSITIVE Sensitive     ERYTHROMYCIN <=0.25 SENSITIVE Sensitive     GENTAMICIN <=0.5 SENSITIVE Sensitive     OXACILLIN 0.5 SENSITIVE Sensitive     TETRACYCLINE <=1 SENSITIVE Sensitive     VANCOMYCIN 1 SENSITIVE Sensitive     TRIMETH/SULFA <=10 SENSITIVE Sensitive     CLINDAMYCIN <=0.25 SENSITIVE Sensitive     RIFAMPIN <=0.5 SENSITIVE Sensitive     Inducible Clindamycin NEGATIVE Sensitive     * STAPHYLOCOCCUS AUREUS  Blood Culture ID Panel (Reflexed)     Status: Abnormal   Collection Time: 07/24/20  6:37 PM  Result Value Ref Range Status   Enterococcus faecalis NOT DETECTED NOT DETECTED Final   Enterococcus Faecium NOT DETECTED NOT DETECTED Final   Listeria monocytogenes NOT DETECTED NOT DETECTED Final   Staphylococcus species DETECTED (A) NOT DETECTED Final    Comment: CRITICAL RESULT CALLED TO, READ BACK BY AND VERIFIED WITHCandiss Norse: [facility] PH[facility]MD 2051 07/25/20 A BROWNING    Staphylococcus aureus (BCID) DETECTED (A) NOT DETECTED Final    Comment: CRITICAL RESULT CALLED TO, READ BACK BY AND VERIFIED WITH: [facility] PH[facility]MD 2051 07/25/20 A BROWNING    Staphylococcus epidermidis NOT DETECTED NOT DETECTED Final   Staphylococcus lugdunensis NOT DETECTED NOT DETECTED Final   Streptococcus species NOT DETECTED NOT  DETECTED Final   Streptococcus agalactiae NOT DETECTED NOT DETECTED Final    Streptococcus pneumoniae NOT DETECTED NOT DETECTED Final   Streptococcus pyogenes NOT DETECTED NOT DETECTED Final   A.calcoaceticus-baumannii NOT DETECTED NOT DETECTED Final   Bacteroides fragilis NOT DETECTED NOT DETECTED Final   Enterobacterales NOT DETECTED NOT DETECTED Final   Enterobacter cloacae complex NOT DETECTED NOT DETECTED Final   Escherichia coli NOT DETECTED NOT DETECTED Final   Klebsiella aerogenes NOT DETECTED NOT DETECTED Final   Klebsiella oxytoca NOT DETECTED NOT DETECTED Final   Klebsiella pneumoniae NOT DETECTED NOT DETECTED Final   Proteus species NOT DETECTED NOT DETECTED Final   Salmonella species NOT DETECTED NOT DETECTED Final   Serratia marcescens NOT DETECTED NOT DETECTED Final   Haemophilus influenzae NOT DETECTED NOT DETECTED Final   Neisseria meningitidis NOT DETECTED NOT DETECTED Final   Pseudomonas aeruginosa NOT DETECTED NOT DETECTED Final   Stenotrophomonas maltophilia NOT DETECTED NOT DETECTED Final   Candida albicans NOT DETECTED NOT DETECTED Final   Candida auris NOT DETECTED NOT DETECTED Final   Candida glabrata NOT DETECTED NOT DETECTED Final   Candida krusei NOT DETECTED NOT DETECTED Final   Candida parapsilosis NOT DETECTED NOT DETECTED Final   Candida tropicalis NOT DETECTED NOT DETECTED Final   Cryptococcus neoformans/gattii NOT DETECTED NOT DETECTED Final   Meth resistant mecA/C and MREJ NOT DETECTED NOT DETECTED Final    Comment: Performed at [facility], [address], [place], [place] [address]  SARS Coronavirus 2 by RT PCR (hospital order, performed in [facility] hospital lab) Nasopharyngeal Peripheral     Status: None   Collection Time: 07/24/20  7:28 PM   Specimen: Peripheral; Nasopharyngeal  Result Value Ref Range Status   SARS Coronavirus 2 NEGATIVE NEGATIVE Final    Comment: (NOTE) SARS-CoV-2 target nucleic acids are NOT DETECTED.  The SARS-CoV-2 RNA is generally detectable in upper and lower respiratory specimens  during the acute phase of infection. The lowest concentration of SARS-CoV-2 viral copies this assay can detect is 250 copies / mL. A negative result does not preclude SARS-CoV-2 infection and should not be used as the sole basis for treatment or other patient management decisions.  A negative result may occur with improper specimen collection / handling, submission of specimen other than nasopharyngeal swab, presence of viral mutation(s) within the areas targeted by this assay, and inadequate number of viral copies (<250 copies / mL). A negative result must be combined with clinical observations, patient history, and epidemiological information.  Fact Sheet for Patients:   BoilerBrush.com.cy  Fact Sheet for Healthcare Providers: https://pope.com/  This test is not yet approved or  cleared by the [place] FDA and has been authorized for detection and/or diagnosis of SARS-CoV-2 by FDA under an Emergency Use Authorization (EUA).  This EUA will remain in effect (meaning this test can be used) for the duration of the COVID-19 declaration under Section 564(b)(1) of the Act, 21 U.S.C. section 360bbb-3(b)(1), unless the authorization is terminated or revoked sooner.  Performed at [facility], [address] [place], [place] [address]   MRSA PCR Screening     Status: Abnormal   Collection Time: 07/25/20  4:21 AM   Specimen: Nasopharyngeal  Result Value Ref Range Status   MRSA by PCR POSITIVE (A) NEGATIVE Final    Comment:        The GeneXpert MRSA Assay (FDA approved for NASAL specimens only), is one component of a comprehensive MRSA colonization  surveillance program. It is not intended to diagnose MRSA infection nor to guide or monitor treatment for MRSA infections. RESULT CALLED TO, READ BACK BY AND VERIFIED WITH: RN Jasmine December [facility] 2952 [phone] FCP Performed at [facility], [address], [place],  [place] [address]   Culture, blood (Routine X 2) w Reflex to ID Panel     Status: None (Preliminary result)   Collection Time: 07/26/20  5:34 PM   Specimen: BLOOD LEFT ARM  Result Value Ref Range Status   Specimen Description BLOOD LEFT ARM  Final   Special Requests   Final    BOTTLES DRAWN AEROBIC AND ANAEROBIC Blood Culture adequate volume   Culture   Final    NO GROWTH < 24 HOURS Performed at [facility], [address], [place], [place] [address]    Report Status PENDING  Incomplete  Culture, blood (Routine X 2) w Reflex to ID Panel     Status: Abnormal (Preliminary result)   Collection Time: 07/26/20  5:46 PM   Specimen: BLOOD LEFT HAND  Result Value Ref Range Status   Specimen Description BLOOD LEFT HAND  Final   Special Requests   Final    BOTTLES DRAWN AEROBIC ONLY Blood Culture adequate volume   Culture  Setup Time   Final    GRAM POSITIVE COCCI IN CLUSTERS GRAM POSITIVE COCCI IN CHAINS AEROBIC BOTTLE ONLY Organism ID to follow CRITICAL RESULT CALLED TO, READ BACK BY AND VERIFIED WITH: G. ABBOTT,PHARMD 0405 07/28/2020 Girtha Hake Performed at [facility], [address] [place], [place] [address]    Culture STAPHYLOCOCCUS CAPITIS (A)  Final   Report Status PENDING  Incomplete  Blood Culture ID Panel (Reflexed)     Status: Abnormal   Collection Time: 07/26/20  5:46 PM  Result Value Ref Range Status   Enterococcus faecalis NOT DETECTED NOT DETECTED Final   Enterococcus Faecium NOT DETECTED NOT DETECTED Final   Listeria monocytogenes NOT DETECTED NOT DETECTED Final   Staphylococcus species DETECTED (A) NOT DETECTED Final    Comment: CRITICAL RESULT CALLED TO, READ BACK BY AND VERIFIED WITH: G. ABBOTT,PHARMD 0405 07/28/2020 T. TYSOR    Staphylococcus aureus (BCID) NOT DETECTED NOT DETECTED Final   Staphylococcus epidermidis NOT DETECTED NOT DETECTED Final   Staphylococcus lugdunensis NOT DETECTED NOT DETECTED Final   Streptococcus species DETECTED (A) NOT DETECTED  Final    Comment: Not Enterococcus species, Streptococcus agalactiae, Streptococcus pyogenes, or Streptococcus pneumoniae. CRITICAL RESULT CALLED TO, READ BACK BY AND VERIFIED WITH: G. ABBOTT,PHARMD 0405 07/28/2020 T. TYSOR    Streptococcus agalactiae NOT DETECTED NOT DETECTED Final   Streptococcus pneumoniae NOT DETECTED NOT DETECTED Final   Streptococcus pyogenes NOT DETECTED NOT DETECTED Final   A.calcoaceticus-baumannii NOT DETECTED NOT DETECTED Final   Bacteroides fragilis NOT DETECTED NOT DETECTED Final   Enterobacterales NOT DETECTED NOT DETECTED Final   Enterobacter cloacae complex NOT DETECTED NOT DETECTED Final   Escherichia coli NOT DETECTED NOT DETECTED Final   Klebsiella aerogenes NOT DETECTED NOT DETECTED Final   Klebsiella oxytoca NOT DETECTED NOT DETECTED Final   Klebsiella pneumoniae NOT DETECTED NOT DETECTED Final   Proteus species NOT DETECTED NOT DETECTED Final   Salmonella species NOT DETECTED NOT DETECTED Final   Serratia marcescens NOT DETECTED NOT DETECTED Final   Haemophilus influenzae NOT DETECTED NOT DETECTED Final   Neisseria meningitidis NOT DETECTED NOT DETECTED Final   Pseudomonas aeruginosa NOT DETECTED NOT DETECTED Final   Stenotrophomonas maltophilia NOT  DETECTED NOT DETECTED Final   Candida albicans NOT DETECTED NOT DETECTED Final   Candida auris NOT DETECTED NOT DETECTED Final   Candida glabrata NOT DETECTED NOT DETECTED Final   Candida krusei NOT DETECTED NOT DETECTED Final   Candida parapsilosis NOT DETECTED NOT DETECTED Final   Candida tropicalis NOT DETECTED NOT DETECTED Final   Cryptococcus neoformans/gattii NOT DETECTED NOT DETECTED Final    Comment: Performed at [facility], [address] [place], [place] [address]  Culture, blood (Routine X 2) w Reflex to ID Panel     Status: None (Preliminary result)   Collection Time: 07/27/20 11:10 AM   Specimen: BLOOD RIGHT ARM  Result Value Ref Range Status   Specimen Description BLOOD RIGHT  ARM  Final   Special Requests   Final    BOTTLES DRAWN AEROBIC AND ANAEROBIC Blood Culture adequate volume   Culture   Final    NO GROWTH < 12 HOURS Performed at [facility], [address] [place], [place] [address]    Report Status PENDING  Incomplete    Doran Stabler, [facility] [phone] pager   07/28/2020, 2:55 PM

## 2020-07-28 NOTE — Progress Notes (Signed)
Progress Note    Joshua Jacobs  ZOX:[id number] DOB: Oct 18, 1976  DOA: 07/24/2020 PCP: Patient, No Pcp Per    Brief Narrative:     Medical records reviewed and are as summarized below:  Sylvestre Rathgeber is an 44 y.o. 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  Associated with severe pain.  States the pain caused him to relapse with his IV drug addiction.  Also reports a wound on his abdomen he noted 3 days ago.  Denies any bug bites.  In the ED at [facility], CT maxillofacial revealed right facial cellulitis, probably odontogenic with infection source suspected to be carious right maxillary, subperiosteal abscess suspected near the root of the tooth.  CT abdomen and pelvis showing skin thickening with ill-defined edema involving the right lower anterior abdominal wall consistent with cellulitis.  TRH asked to admit for cellulitis.  Started on IV antibiotics empirically in the ED, IV vancomycin.  Assessment/Plan:   Principal Problem:   Cellulitis Active Problems:   IV drug user  Facial and abdominal wall Cellulitis with staph aureus bacteremia -vanc/clinda -soft diet - MSSA - repeat blood cultures NGTD -TTE: Left ventricular ejection fraction, by estimation, is 60 to 65%. The left ventricle has normal function. The left ventricle has no regional wall motion abnormalities. The left ventricular internal cavity size was mildly dilated. Left ventricular diastolic parameters were normal. No vaveular issues noted -defer to ID need for TEE -MRI of hip/spine w/o signs of infection but does show: Severe age advanced bilateral hip joint degenerative changes, Mild to moderate facet hypertrophy at L2-3 through L5-S1, which could contribute to lower back pain  left greater than right. CT maxillofacial: probably odontogenic with infection source suspected to be the carious right maxillary canine with a subtle/trace subperiosteal abscess suspected near the root of that tooth.    IV drug user - has been counseled - UDS revealed opiates and cocaine   B/l arm swelling - likely related to poor vasculature related to chronic IV drug use  HTN - Amlodipine  obesity Body mass index is 51.36 kg/m.   Family Communication/Anticipated D/C date and plan/Code Status   DVT prophylaxis: scds Code Status: Full Code.  Disposition Plan: Status is: Inpatient  Remains inpatient appropriate because:Inpatient level of care appropriate due to severity of illness   Dispo: The patient is from: Home              Anticipated d/c is to: Home              Anticipated d/c date is: > 3 days              Patient currently is not medically stable to d/c.         Medical Consultants:    ID   Subjective:   No CP, no SOB, no fevers  Objective:    Vitals:   07/27/20 1839 07/27/20 2023 07/28/20 0328 07/28/20 0856  BP: 138/83 126/90 (!) 143/95 136/79  Pulse: 67 77 60 (!) 53  Resp: Temp: 98.3 F (36.8 C) 99.5 F (37.5 C) 98 F (36.7 C) 97.9 F (36.6 C)  TempSrc: Oral Oral Oral Oral  SpO2: 97% 95% 100% 97%  Weight:      Height:  Intake/Output Summary (Last 24 hours) at 07/28/2020 0942 Last data filed at 07/28/2020 0500 Gross per 24 hour  Intake 822.05 ml  Output 1000 ml  Net -177.95 ml   Filed Weights   07/24/20 1811  Weight: (!) 144.3 kg    Exam:  General: Appearance:    Severely obese male in no acute distress     Lungs:      respirations unlabored  Heart:    Bradycardic. Normal rhythm. No murmurs, rubs, or gallops.   MS:   All extremities are intact.   Neurologic:   Awake, alert, oriented x 3. No apparent focal neurological           defect.     Data Reviewed:   I have personally reviewed following labs  and imaging studies:  Labs: Labs show the following:   Basic Metabolic Panel: Recent Labs  Lab 07/24/20 1836 07/25/20 1403  NA 135 136  K 3.5 3.9  CL 100 101  CO2 24 25  GLUCOSE 137* 101*  BUN 13 8  CREATININE 1.10 0.96  CALCIUM 9.4 9.2   GFR Estimated Creatinine Clearance: 137.5 mL/min (by C-G formula based on SCr of 0.96 mg/dL). Liver Function Tests: Recent Labs  Lab 07/24/20 1836 07/25/20 1403  AST 20 16  ALT 16 14  ALKPHOS 112 109  BILITOT 0.3 0.8  PROT 9.5* 8.1  ALBUMIN 4.2 3.5   No results for input(s): LIPASE, AMYLASE in the last 168 hours. No results for input(s): AMMONIA in the last 168 hours. Coagulation profile No results for input(s): INR, PROTIME in the last 168 hours.  CBC: Recent Labs  Lab 07/24/20 1836 07/25/20 1403  WBC 12.9* 9.8  NEUTROABS 10.0* 6.6  HGB 12.3* 11.0*  HCT 38.8* 34.9*  MCV 76.4* 77.6*  PLT 465* 406*   Cardiac Enzymes: No results for input(s): CKTOTAL, CKMB, CKMBINDEX, TROPONINI in the last 168 hours. BNP (last 3 results) No results for input(s): PROBNP in the last 8760 hours. CBG: No results for input(s): GLUCAP in the last 168 hours. D-Dimer: No results for input(s): DDIMER in the last 72 hours. Hgb A1c: No results for input(s): HGBA1C in the last 72 hours. Lipid Profile: No results for input(s): CHOL, HDL, LDLCALC, TRIG, CHOLHDL, LDLDIRECT in the last 72 hours. Thyroid function studies: No results for input(s): TSH, T4TOTAL, T3FREE, THYROIDAB in the last 72 hours.  Invalid input(s): FREET3 Anemia work up: No results for input(s): VITAMINB12, FOLATE, FERRITIN, TIBC, IRON, RETICCTPCT in the last 72 hours. Sepsis Labs: Recent Labs  Lab 07/24/20 1836 07/25/20 1403  WBC 12.9* 9.8  LATICACIDVEN 1.4  --     Microbiology Recent Results (from the past 240 hour(s))  Culture, blood (routine x 2)     Status: Abnormal   Collection Time: 07/24/20  6:37 PM   Specimen: BLOOD RIGHT ARM  Result Value Ref Range Status     Specimen Description   Final    BLOOD RIGHT ARM Performed at [facility], [address] [place], [place] [address]    Special Requests   Final    BOTTLES DRAWN AEROBIC AND ANAEROBIC Blood Culture adequate volume Performed at [facility], [address] [place], [place] [address]    Culture  Setup Time   Final    GRAM POSITIVE COCCI IN CLUSTERS ANAEROBIC BOTTLE ONLY CRITICAL RESULT CALLED TO, READ BACK BY AND VERIFIED WITHCandiss Norse [facility] 2051 07/25/20 A BROWNING Performed at [facility]  Lab, [address] [place], [place] [address]    Culture STAPHYLOCOCCUS AUREUS (A)  Final   Report Status 07/27/2020 FINAL  Final   Organism ID, Bacteria STAPHYLOCOCCUS AUREUS  Final      Susceptibility   Staphylococcus aureus - MIC*    CIPROFLOXACIN <=0.5 SENSITIVE Sensitive     ERYTHROMYCIN <=0.25 SENSITIVE Sensitive     GENTAMICIN <=0.5 SENSITIVE Sensitive     OXACILLIN 0.5 SENSITIVE Sensitive     TETRACYCLINE <=1 SENSITIVE Sensitive     VANCOMYCIN 1 SENSITIVE Sensitive     TRIMETH/SULFA <=10 SENSITIVE Sensitive     CLINDAMYCIN <=0.25 SENSITIVE Sensitive     RIFAMPIN <=0.5 SENSITIVE Sensitive     Inducible Clindamycin NEGATIVE Sensitive     * STAPHYLOCOCCUS AUREUS  Blood Culture ID Panel (Reflexed)     Status: Abnormal   Collection Time: 07/24/20  6:37 PM  Result Value Ref Range Status   Enterococcus faecalis NOT DETECTED NOT DETECTED Final   Enterococcus Faecium NOT DETECTED NOT DETECTED Final   Listeria monocytogenes NOT DETECTED NOT DETECTED Final   Staphylococcus species DETECTED (A) NOT DETECTED Final    Comment: CRITICAL RESULT CALLED TO, READ BACK BY AND VERIFIED WITH: H VON [facility] PHARMD 2051 07/25/20 A BROWNING    Staphylococcus aureus (BCID) DETECTED (A) NOT DETECTED Final    Comment: CRITICAL RESULT CALLED TO, READ BACK BY AND VERIFIED WITH: H VON [employer] PHARMD 2051 07/25/20 A BROWNING    Staphylococcus epidermidis NOT DETECTED NOT DETECTED  Final   Staphylococcus lugdunensis NOT DETECTED NOT DETECTED Final   Streptococcus species NOT DETECTED NOT DETECTED Final   Streptococcus agalactiae NOT DETECTED NOT DETECTED Final   Streptococcus pneumoniae NOT DETECTED NOT DETECTED Final   Streptococcus pyogenes NOT DETECTED NOT DETECTED Final   A.calcoaceticus-baumannii NOT DETECTED NOT DETECTED Final   Bacteroides fragilis NOT DETECTED NOT DETECTED Final   Enterobacterales NOT DETECTED NOT DETECTED Final   Enterobacter cloacae complex NOT DETECTED NOT DETECTED Final   Escherichia coli NOT DETECTED NOT DETECTED Final   Klebsiella aerogenes NOT DETECTED NOT DETECTED Final   Klebsiella oxytoca NOT DETECTED NOT DETECTED Final   Klebsiella pneumoniae NOT DETECTED NOT DETECTED Final   Proteus species NOT DETECTED NOT DETECTED Final   Salmonella species NOT DETECTED NOT DETECTED Final   Serratia marcescens NOT DETECTED NOT DETECTED Final   Haemophilus influenzae NOT DETECTED NOT DETECTED Final   Neisseria meningitidis NOT DETECTED NOT DETECTED Final   Pseudomonas aeruginosa NOT DETECTED NOT DETECTED Final   Stenotrophomonas maltophilia NOT DETECTED NOT DETECTED Final   Candida albicans NOT DETECTED NOT DETECTED Final   Candida auris NOT DETECTED NOT DETECTED Final   Candida glabrata NOT DETECTED NOT DETECTED Final   Candida krusei NOT DETECTED NOT DETECTED Final   Candida parapsilosis NOT DETECTED NOT DETECTED Final   Candida tropicalis NOT DETECTED NOT DETECTED Final   Cryptococcus neoformans/gattii NOT DETECTED NOT DETECTED Final   Meth resistant mecA/C and MREJ NOT DETECTED NOT DETECTED Final    Comment: Performed at [facility], [address], [place], [place] [address]  SARS Coronavirus 2 by RT PCR (hospital order, performed in [facility] hospital lab) Nasopharyngeal Peripheral     Status: None   Collection Time: 07/24/20  7:28 PM   Specimen: Peripheral; Nasopharyngeal  Result Value Ref Range Status   SARS Coronavirus 2  NEGATIVE NEGATIVE Final    Comment: (NOTE) SARS-CoV-2 target nucleic acids are NOT DETECTED.  The SARS-CoV-2 RNA is generally  detectable in upper and lower respiratory specimens during the acute phase of infection. The lowest concentration of SARS-CoV-2 viral copies this assay can detect is 250 copies / mL. A negative result does not preclude SARS-CoV-2 infection and should not be used as the sole basis for treatment or other patient management decisions.  A negative result may occur with improper specimen collection / handling, submission of specimen other than nasopharyngeal swab, presence of viral mutation(s) within the areas targeted by this assay, and inadequate number of viral copies (<250 copies / mL). A negative result must be combined with clinical observations, patient history, and epidemiological information.  Fact Sheet for Patients:   BoilerBrush.com.cy  Fact Sheet for Healthcare Providers: https://pope.com/  This test is not yet approved or  cleared by the [place] FDA and has been authorized for detection and/or diagnosis of SARS-CoV-2 by FDA under an Emergency Use Authorization (EUA).  This EUA will remain in effect (meaning this test can be used) for the duration of the COVID-19 declaration under Section 564(b)(1) of the Act, 21 U.S.C. section 360bbb-3(b)(1), unless the authorization is terminated or revoked sooner.  Performed at [facility], [address] [place], [place] [address]   MRSA PCR Screening     Status: Abnormal   Collection Time: 07/25/20  4:21 AM   Specimen: Nasopharyngeal  Result Value Ref Range Status   MRSA by PCR POSITIVE (A) NEGATIVE Final    Comment:        The GeneXpert MRSA Assay (FDA approved for NASAL specimens only), is one component of a comprehensive MRSA colonization surveillance program. It is not intended to diagnose MRSA infection nor to guide or monitor  treatment for MRSA infections. RESULT CALLED TO, READ BACK BY AND VERIFIED WITH: RN Jasmine December [facility] 6045 [phone] FCP Performed at [facility], [address] [place], [place] [address]   Culture, blood (Routine X 2) w Reflex to ID Panel     Status: None (Preliminary result)   Collection Time: 07/26/20  5:34 PM   Specimen: BLOOD LEFT ARM  Result Value Ref Range Status   Specimen Description BLOOD LEFT ARM  Final   Special Requests   Final    BOTTLES DRAWN AEROBIC AND ANAEROBIC Blood Culture adequate volume   Culture   Final    NO GROWTH < 24 HOURS Performed at [facility], [address] [place], [place] [address]    Report Status PENDING  Incomplete  Culture, blood (Routine X 2) w Reflex to ID Panel     Status: None (Preliminary result)   Collection Time: 07/26/20  5:46 PM   Specimen: BLOOD LEFT HAND  Result Value Ref Range Status   Specimen Description BLOOD LEFT HAND  Final   Special Requests   Final    BOTTLES DRAWN AEROBIC ONLY Blood Culture adequate volume   Culture  Setup Time   Final    GRAM POSITIVE COCCI IN CLUSTERS GRAM POSITIVE COCCI IN CHAINS AEROBIC BOTTLE ONLY Organism ID to follow CRITICAL RESULT CALLED TO, READ BACK BY AND VERIFIED WITH: G. ABBOTT,PHARMD 0405 07/28/2020 Girtha Hake Performed at [facility], [address], [place], [place] [address]    Culture [facility] POSITIVE COCCI  Final   Report Status PENDING  Incomplete  Blood Culture ID Panel (Reflexed)     Status: Abnormal   Collection Time: 07/26/20  5:46 PM  Result Value Ref Range Status   Enterococcus faecalis NOT DETECTED NOT DETECTED  Final   Enterococcus Faecium NOT DETECTED NOT DETECTED Final   Listeria monocytogenes NOT DETECTED NOT DETECTED Final   Staphylococcus species DETECTED (A) NOT DETECTED Final    Comment: CRITICAL RESULT CALLED TO, READ BACK BY AND VERIFIED WITH: G. ABBOTT,PHARMD 0405 07/28/2020 T. TYSOR    Staphylococcus aureus (BCID) NOT DETECTED NOT DETECTED  Final   Staphylococcus epidermidis NOT DETECTED NOT DETECTED Final   Staphylococcus lugdunensis NOT DETECTED NOT DETECTED Final   Streptococcus species DETECTED (A) NOT DETECTED Final    Comment: Not Enterococcus species, Streptococcus agalactiae, Streptococcus pyogenes, or Streptococcus pneumoniae. CRITICAL RESULT CALLED TO, READ BACK BY AND VERIFIED WITH: G. ABBOTT,PHARMD 0405 07/28/2020 T. TYSOR    Streptococcus agalactiae NOT DETECTED NOT DETECTED Final   Streptococcus pneumoniae NOT DETECTED NOT DETECTED Final   Streptococcus pyogenes NOT DETECTED NOT DETECTED Final   A.calcoaceticus-baumannii NOT DETECTED NOT DETECTED Final   Bacteroides fragilis NOT DETECTED NOT DETECTED Final   Enterobacterales NOT DETECTED NOT DETECTED Final   Enterobacter cloacae complex NOT DETECTED NOT DETECTED Final   Escherichia coli NOT DETECTED NOT DETECTED Final   Klebsiella aerogenes NOT DETECTED NOT DETECTED Final   Klebsiella oxytoca NOT DETECTED NOT DETECTED Final   Klebsiella pneumoniae NOT DETECTED NOT DETECTED Final   Proteus species NOT DETECTED NOT DETECTED Final   Salmonella species NOT DETECTED NOT DETECTED Final   Serratia marcescens NOT DETECTED NOT DETECTED Final   Haemophilus influenzae NOT DETECTED NOT DETECTED Final   Neisseria meningitidis NOT DETECTED NOT DETECTED Final   Pseudomonas aeruginosa NOT DETECTED NOT DETECTED Final   Stenotrophomonas maltophilia NOT DETECTED NOT DETECTED Final   Candida albicans NOT DETECTED NOT DETECTED Final   Candida auris NOT DETECTED NOT DETECTED Final   Candida glabrata NOT DETECTED NOT DETECTED Final   Candida krusei NOT DETECTED NOT DETECTED Final   Candida parapsilosis NOT DETECTED NOT DETECTED Final   Candida tropicalis NOT DETECTED NOT DETECTED Final   Cryptococcus neoformans/gattii NOT DETECTED NOT DETECTED Final    Comment: Performed at [facility], [address], [place], [place] [address]  Culture, blood (Routine X 2) w Reflex to  ID Panel     Status: None (Preliminary result)   Collection Time: 07/27/20 11:10 AM   Specimen: BLOOD RIGHT ARM  Result Value Ref Range Status   Specimen Description BLOOD RIGHT ARM  Final   Special Requests   Final    BOTTLES DRAWN AEROBIC AND ANAEROBIC Blood Culture adequate volume   Culture   Final    NO GROWTH < 12 HOURS Performed at [facility], [address] [place], [place] [address]    Report Status PENDING  Incomplete    Procedures and diagnostic studies:  MR LUMBAR SPINE WO CONTRAST  Result Date: 07/27/2020 CLINICAL DATA:  Initial evaluation for acute low back pain, infection suspected. History of MSSA bacteremia. EXAM: MRI LUMBAR SPINE WITHOUT CONTRAST TECHNIQUE: Multiplanar, multisequence MR imaging of the lumbar spine was performed. No intravenous contrast was administered. COMPARISON:  Prior MRI from 06/04/2019. FINDINGS: Segmentation: Standard. Lowest well-formed disc space labeled the L5-S1 level. Alignment: Physiologic with preservation of the normal of lumbar lordosis. No listhesis. Vertebrae: Vertebral body height maintained without acute or chronic fracture. Bone marrow signal intensity within normal limits. 12 mm benign hemangioma noted within the right sacral ala. No other discrete or worrisome osseous lesions. No abnormal edema to suggest osteomyelitis discitis or septic arthritis. Visualized SI joints symmetric and within normal limits. Conus medullaris and  cauda equina: Conus extends to the L1 level. Conus and cauda equina appear normal. Paraspinal and other soft tissues: Visualized paraspinous soft tissues within normal limits without edema. No soft tissue collections or inflammatory changes. Partially visualized visceral structures within normal limits. Disc levels: T12-L1: Seen only on sagittal projection. Disc bulge with disc desiccation and mild reactive endplate changes. Superimposed left subarticular to foraminal disc protrusion (series 9, image 9). Mild  posterior element hypertrophy. No significant spinal stenosis. Foramina remain patent. T12-L1: Unremarkable. L1-2:  Unremarkable. L2-3: Normal interspace. Mild facet hypertrophy. No stenosis or impingement. L3-4: Normal interspace. Mild facet hypertrophy. No stenosis or impingement. L4-5: Disc desiccation with mild diffuse disc bulge. Superimposed small central disc protrusion with annular fissure. Mild to moderate facet and ligament flavum hypertrophy. Resultant mild spinal stenosis with mild left greater than right lateral recess narrowing. Mild left L4 foraminal stenosis. No significant right foraminal encroachment. L5-S1: Normal interspace. Moderate right with mild left facet hypertrophy. Mild epidural lipomatosis. No canal or foraminal stenosis. No impingement. IMPRESSION: 1. No MRI evidence for acute infection within the lumbar spine. 2. Small central disc protrusion and facet hypertrophy at L4-5 with resultant mild spinal stenosis, with mild left greater than right lateral recess narrowing. 3. Mild to moderate facet hypertrophy at L2-3 through L5-S1, which could contribute to lower back pain. 4. Left subarticular to foraminal disc protrusion at T12-L1 without significant stenosis or neural impingement.\ Electronically Signed   By: Rise MuBenjamin  McClintock M.D.   On: 07/27/2020 20:47   MR HIP RIGHT WO CONTRAST  Result Date: 07/28/2020 CLINICAL DATA:  Low back and right hip pain. EXAM: MR OF THE RIGHT HIP WITHOUT CONTRAST TECHNIQUE: Multiplanar, multisequence MR imaging was performed. No intravenous contrast was administered. COMPARISON:  CT scan 07/24/2020 FINDINGS: Examination limited by patient motion, body habitus and patient refusing to continue the examination. Severe age advanced bilateral hip joint degenerative changes, left greater than right. Advanced joint space narrowing, osteophytic spurring, bony eburnation and subchondral cystic change. Large subchondral cyst/geode 0 noted in the left femoral  head. No findings for stress fracture or AVN. Small amount of hip joint fluid bilaterally but no overt joint effusion. No periarticular fluid collections. No significant peritendinitis or trochanteric bursitis. The pubic symphysis and SI joints are intact. No pelvic fractures or bone lesions. A small hemangioma is noted in the right sacrum is dental E. The surrounding hip and pelvic musculature is grossly normal. No muscle tear, myositis or mass. The hamstring tendons are intact. No significant intrapelvic abnormalities are identified. IMPRESSION: 1. Severe age advanced bilateral hip joint degenerative changes, left greater than right. 2. No findings for stress fracture or AVN. 3. Intact bony pelvis. 4. No significant intrapelvic abnormalities. Electronically Signed   By: Rudie MeyerP.  Gallerani M.D.   On: 07/28/2020 05:38   ECHOCARDIOGRAM COMPLETE  Result Date: 07/27/2020    ECHOCARDIOGRAM REPORT   Patient Name:   Joshua Jacobs Date of Exam: 07/26/2020 Medical Rec #:  [id number]       Height:       66.0 in Accession #:    [id number]      Weight:       318.2 lb Date of Birth:  08/25/1976       BSA:          2.436 m Patient Age:    43 years        BP:           114/84 mmHg Patient Gender: M  HR:           63 bpm. Exam Location:  Inpatient Procedure: 2D Echo Indications:    bacteremia 790.7  History:        Patient has prior history of Echocardiogram examinations, most                 recent 08/18/2017. Risk Factors:IV drug use.  Sonographer:    Delcie Roch Referring Phys: [id number] SAIMA RIZWAN  Sonographer Comments: Image acquisition challenging due to patient body habitus. IMPRESSIONS  1. Left ventricular ejection fraction, by estimation, is 60 to 65%. The left ventricle has normal function. The left ventricle has no regional wall motion abnormalities. The left ventricular internal cavity size was mildly dilated. Left ventricular diastolic parameters were normal.  2. Right ventricular systolic function is  normal. The right ventricular size is normal. Tricuspid regurgitation signal is inadequate for assessing PA pressure.  3. The mitral valve is normal in structure. No evidence of mitral valve regurgitation. No evidence of mitral stenosis.  4. The aortic valve is normal in structure. Aortic valve regurgitation is not visualized. No aortic stenosis is present.  5. Aortic dilatation noted. There is mild dilatation of the aortic root, measuring 39 mm. There is borderline dilatation of the ascending aorta, measuring 37 mm.  6. The inferior vena cava is dilated in size with >50% respiratory variability, suggesting right atrial pressure of 8 mmHg. FINDINGS  Left Ventricle: Left ventricular ejection fraction, by estimation, is 60 to 65%. The left ventricle has normal function. The left ventricle has no regional wall motion abnormalities. The left ventricular internal cavity size was mildly dilated. There is  no left ventricular hypertrophy. Left ventricular diastolic parameters were normal. Normal left ventricular filling pressure. Right Ventricle: The right ventricular size is normal. No increase in right ventricular wall thickness. Right ventricular systolic function is normal. Tricuspid regurgitation signal is inadequate for assessing PA pressure. Left Atrium: Left atrial size was normal in size. Right Atrium: Right atrial size was normal in size. Pericardium: There is no evidence of pericardial effusion. Mitral Valve: The mitral valve is normal in structure. No evidence of mitral valve regurgitation. No evidence of mitral valve stenosis. Tricuspid Valve: The tricuspid valve is normal in structure. Tricuspid valve regurgitation is not demonstrated. No evidence of tricuspid stenosis. Aortic Valve: The aortic valve is normal in structure. Aortic valve regurgitation is not visualized. No aortic stenosis is present. Pulmonic Valve: The pulmonic valve was normal in structure. Pulmonic valve regurgitation is not visualized. No  evidence of pulmonic stenosis. Aorta: Aortic dilatation noted. There is mild dilatation of the aortic root, measuring 39 mm. There is borderline dilatation of the ascending aorta, measuring 37 mm. Venous: The inferior vena cava is dilated in size with greater than 50% respiratory variability, suggesting right atrial pressure of 8 mmHg. IAS/Shunts: No atrial level shunt detected by color flow Doppler.  LEFT VENTRICLE PLAX 2D LVIDd:         5.60 cm  Diastology LVIDs:         3.60 cm  LV e' medial:    8.05 cm/s LV PW:         1.10 cm  LV E/e' medial:  12.0 LV IVS:        1.20 cm  LV e' lateral:   13.60 cm/s LVOT diam:     2.30 cm  LV E/e' lateral: 7.1 LV SV:         124 LV SV Index:   51  LVOT Area:     4.15 cm  RIGHT VENTRICLE RV S prime:     15.70 cm/s TAPSE (M-mode): 2.5 cm LEFT ATRIUM             Index       RIGHT ATRIUM           Index LA diam:        4.10 cm 1.68 cm/m  RA Area:     18.60 cm LA Vol (A2C):   50.0 ml 20.52 ml/m RA Volume:   49.90 ml  20.48 ml/m LA Vol (A4C):   48.2 ml 19.79 ml/m LA Biplane Vol: 50.2 ml 20.61 ml/m  AORTIC VALVE LVOT Vmax:   159.00 cm/s LVOT Vmean:  90.200 cm/s LVOT VTI:    0.298 m  AORTA Ao Root diam: 3.90 cm Ao Asc diam:  3.70 cm MITRAL VALVE MV Area (PHT): 3.42 cm    SHUNTS MV Decel Time: 222 msec    Systemic VTI:  0.30 m MV E velocity: 96.30 cm/s  Systemic Diam: 2.30 cm MV A velocity: 90.30 cm/s MV E/A ratio:  1.07 Armanda Magic MD Electronically signed by Armanda Magic MD Signature Date/Time: 07/27/2020/9:06:45 AM    Final     Medications:   . amLODipine  5 mg Oral Daily  . chlorhexidine  15 mL Mouth/Throat QID  . Chlorhexidine Gluconate Cloth  6 each Topical Q0600  . FLUoxetine  20 mg Oral Daily  . gabapentin  900 mg Oral BID  . hydrOXYzine  100 mg Oral TID  . LORazepam  1 mg Oral QHS  . mupirocin ointment  1 application Nasal BID   Continuous Infusions: . sodium chloride Stopped (07/27/20 1334)  . clindamycin (CLEOCIN) IV 300 mg (07/28/20 0549)  .  vancomycin 1,500 mg (07/28/20 0941)     LOS: 3 days   Joseph Art  [facility]   How to contact the [facility] Attending or Consulting provider 7A - 7P or covering provider during after hours 7P -7A, for this patient?  1. Check the care team in [facility] and look for a) attending/consulting TRH provider listed and b) the [facility] team listed 2. Log into [email or web address] and use 's universal password to access. If you do not have the password, please contact the hospital operator. 3. Locate the [facility] provider you are looking for under [facility] and page to a number that you can be directly reached. 4. If you still have difficulty reaching the provider, please page the [facility] (Director on Call) for the Hospitalists listed on amion for assistance.  07/28/2020, 9:42 AM

## 2020-07-28 NOTE — Plan of Care (Signed)

## 2020-07-28 NOTE — Plan of Care (Signed)
  Problem: Clinical Measurements: Goal: Ability to avoid or minimize complications of infection will improve Outcome: Progressing   Problem: Clinical Measurements: Goal: Ability to avoid or minimize complications of infection will improve Outcome: Progressing   Problem: Clinical Measurements: Goal: Ability to avoid or minimize complications of infection will improve Outcome: Progressing   Problem: Clinical Measurements: Goal: Ability to avoid or minimize complications of infection will improve Outcome: Progressing   Problem: Clinical Measurements: Goal: Ability to avoid or minimize complications of infection will improve Outcome: Progressing

## 2020-07-28 NOTE — Progress Notes (Signed)
PHARMACY - PHYSICIAN COMMUNICATION CRITICAL VALUE ALERT - BLOOD CULTURE IDENTIFICATION (BCID)  Joshua Jacobs is an 44 y.o. male with h/o IVDA and Staph aureus bacteremia  Assessment:   1/2 blood cultures from 9/11 growing Staph species and Strep species  Name of physician (or Provider) Contacted:  X Blount  Current antibiotics: Vancomycin and Clindmycin  Changes to prescribed antibiotics recommended:  None at this time  Results for orders placed or performed during the hospital encounter of 07/24/20  Blood Culture ID Panel (Reflexed) (Collected: 07/26/2020  5:46 PM)  Result Value Ref Range   Enterococcus faecalis NOT DETECTED NOT DETECTED   Enterococcus Faecium NOT DETECTED NOT DETECTED   Listeria monocytogenes NOT DETECTED NOT DETECTED   Staphylococcus species DETECTED (A) NOT DETECTED   Staphylococcus aureus (BCID) NOT DETECTED NOT DETECTED   Staphylococcus epidermidis NOT DETECTED NOT DETECTED   Staphylococcus lugdunensis NOT DETECTED NOT DETECTED   Streptococcus species DETECTED (A) NOT DETECTED   Streptococcus agalactiae NOT DETECTED NOT DETECTED   Streptococcus pneumoniae NOT DETECTED NOT DETECTED   Streptococcus pyogenes NOT DETECTED NOT DETECTED   A.calcoaceticus-baumannii NOT DETECTED NOT DETECTED   Bacteroides fragilis NOT DETECTED NOT DETECTED   Enterobacterales NOT DETECTED NOT DETECTED   Enterobacter cloacae complex NOT DETECTED NOT DETECTED   Escherichia coli NOT DETECTED NOT DETECTED   Klebsiella aerogenes NOT DETECTED NOT DETECTED   Klebsiella oxytoca NOT DETECTED NOT DETECTED   Klebsiella pneumoniae NOT DETECTED NOT DETECTED   Proteus species NOT DETECTED NOT DETECTED   Salmonella species NOT DETECTED NOT DETECTED   Serratia marcescens NOT DETECTED NOT DETECTED   Haemophilus influenzae NOT DETECTED NOT DETECTED   Neisseria meningitidis NOT DETECTED NOT DETECTED   Pseudomonas aeruginosa NOT DETECTED NOT DETECTED   Stenotrophomonas maltophilia NOT DETECTED  NOT DETECTED   Candida albicans NOT DETECTED NOT DETECTED   Candida auris NOT DETECTED NOT DETECTED   Candida glabrata NOT DETECTED NOT DETECTED   Candida krusei NOT DETECTED NOT DETECTED   Candida parapsilosis NOT DETECTED NOT DETECTED   Candida tropicalis NOT DETECTED NOT DETECTED   Cryptococcus neoformans/gattii NOT DETECTED NOT DETECTED    Eddie Candle 07/28/2020  6:06 AM

## 2020-07-29 ENCOUNTER — Inpatient Hospital Stay: Payer: Self-pay

## 2020-07-29 ENCOUNTER — Inpatient Hospital Stay (HOSPITAL_COMMUNITY): Payer: Self-pay

## 2020-07-29 DIAGNOSIS — B9561 Methicillin susceptible Staphylococcus aureus infection as the cause of diseases classified elsewhere: Secondary | ICD-10-CM

## 2020-07-29 DIAGNOSIS — R21 Rash and other nonspecific skin eruption: Secondary | ICD-10-CM

## 2020-07-29 LAB — HEPATITIS C VRS RNA DETECT BY PCR-QUAL: Hepatitis C Vrs RNA by PCR-Qual: NEGATIVE

## 2020-07-29 LAB — CBC
HCT: 35.2 % — ABNORMAL LOW (ref 39.0–52.0)
Hemoglobin: 11 g/dL — ABNORMAL LOW (ref 13.0–17.0)
MCH: 24.3 pg — ABNORMAL LOW (ref 26.0–34.0)
MCHC: 31.3 g/dL (ref 30.0–36.0)
MCV: 77.9 fL — ABNORMAL LOW (ref 80.0–100.0)
Platelets: 363 10*3/uL (ref 150–400)
RBC: 4.52 MIL/uL (ref 4.22–5.81)
RDW: 15.3 % (ref 11.5–15.5)
WBC: 6.4 10*3/uL (ref 4.0–10.5)
nRBC: 0 % (ref 0.0–0.2)

## 2020-07-29 LAB — BASIC METABOLIC PANEL
Anion gap: 9 (ref 5–15)
BUN: 15 mg/dL (ref 6–20)
CO2: 25 mmol/L (ref 22–32)
Calcium: 9.3 mg/dL (ref 8.9–10.3)
Chloride: 103 mmol/L (ref 98–111)
Creatinine, Ser: 0.94 mg/dL (ref 0.61–1.24)
GFR calc Af Amer: >60 mL/min (ref >60)
GFR calc non Af Amer: >60 mL/min (ref >60)
Glucose, Bld: 94 mg/dL (ref 70–99)
Potassium: 4.3 mmol/L (ref 3.5–5.1)
Sodium: 137 mmol/L (ref 135–145)

## 2020-07-29 MED ORDER — LORAZEPAM 1 MG PO TABS: 1.0000 mg | ORAL_TABLET | ORAL | Status: AC | PRN

## 2020-07-29 MED ORDER — GABAPENTIN 600 MG PO TABS
1200.0000 mg | ORAL_TABLET | Freq: Every day | ORAL | Status: DC
  Administered 2020-07-29 – 2020-08-01 (×4): 1200 mg via ORAL
  Filled 2020-07-29 (×4): qty 2

## 2020-07-29 MED ORDER — LORAZEPAM 2 MG/ML IJ SOLN: 1.0000 mg | INTRAMUSCULAR | Status: AC | PRN

## 2020-07-29 MED ORDER — DICLOFENAC SODIUM 1 % EX GEL
2.0000 g | CUTANEOUS | Status: DC | PRN
  Filled 2020-07-29: qty 100

## 2020-07-29 NOTE — Plan of Care (Addendum)
Pt agreed to have cardiac monitoring placed. Pt refused moist heat pack to L arm and states he's okay at this time and will let RN know when he needs it.  Problem: Education: Goal: Knowledge of General Education information will improve Description: Including pain rating scale, medication(s)/side effects and non-pharmacologic comfort measures Outcome: Progressing   Problem: Health Behavior/Discharge Planning: Goal: Ability to manage health-related needs will improve Outcome: Progressing   Problem: Activity: Goal: Risk for activity intolerance will decrease Outcome: Progressing   Problem: Nutrition: Goal: Adequate nutrition will be maintained Outcome: Progressing   Problem: Elimination: Goal: Will not experience complications related to bowel motility Outcome: Progressing   Problem: Pain Managment: Goal: General experience of comfort will improve Outcome: Progressing   Problem: Safety: Goal: Ability to remain free from injury will improve Outcome: Progressing   Problem: Skin Integrity: Goal: Risk for impaired skin integrity will decrease Outcome: Progressing

## 2020-07-29 NOTE — Progress Notes (Addendum)
[facility]  Date of Admission:  07/24/2020   Total days of antibiotics 5      Cefazolin         Day 2       Clindamycin Day 4         ASSESSMENT: Joshua Jacobs has MSSA bacteremia secondary to abdominal wall cellulitis and recent soft tissue abscesses.  He also has left facial cellulitis secondary to dental infection.  Patient states he feels better.  Continue cefazolin and clindamycin to cover for anaerobes.  Last negative blood culture 07/27/2020.  He is scheduled for a TEE on Thursday 9/16 which will help determine the duration of the IV antibiotic therapy.  If TEE is negative, patient will need 2 weeks of antibiotic therapy.  Patient agrees with plan to stay in the hospital for the long course of antibiotics.  Patient has an acute onset of multiple erythematous nodules approximately 3 cm in diameter on his left antecubital fossa.  Patient states that this is where they drew labs.  Unsure if patient self injected drugs after admission.  Obtained ultrasound to rule out abscesses.   PLAN: -Continue cefazolin and clindamycin -TEE on 9/16 -Pending ultrasound results -Will place PICC line after ruled out abscesses -Pending HCV RNA quant  Principal Problem:   MSSA bacteremia Active Problems:   Facial cellulitis   IV drug user   Hepatitis C infection   left antecubital fossa nodular rash   Chronic pain syndrome   Pain of both hip joints   Microcytic anemia   Polysubstance dependence (HCC)   Dental infection   Lower back pain   Abdominal wall cellulitis   Scheduled Meds: . amLODipine  5 mg Oral Daily  . chlorhexidine  15 mL Mouth/Throat QID  . Chlorhexidine Gluconate Cloth  6 each Topical Q0600  . FLUoxetine  20 mg Oral Daily  . gabapentin  900 mg Oral BID  . gabapentin  1,200 mg Oral QHS  . hydrOXYzine  100 mg Oral TID  . LORazepam  1 mg Oral QHS  . mupirocin ointment  1 application Nasal BID   Continuous Infusions: . sodium chloride  Stopped (07/27/20 1334)  .  ceFAZolin (ANCEF) IV 2 g (07/29/20 8182)  . clindamycin (CLEOCIN) IV 100 mL/hr at 07/29/20 0600   PRN Meds:.sodium chloride, acetaminophen, diclofenac Sodium, hydrALAZINE, hydrOXYzine, LORazepam **OR** LORazepam, morphine injection, tiZANidine   SUBJECTIVE: Patient is seen at bedside.  He states that he he feels better compared to few days ago.  He still has the back pain and hip pain.  He complains of a erythematous, nodular rash on his left antecubital fossa that has gotten worse in the last few days.  He states that it's where they got his blood.  When discussing the option of staying the hospital for long treatment course of antibiotics, patient agrees with staying to get better.  He is determined to stay away from IV drug and would like to have sober living.  Review of Systems: Review of Systems  Constitutional: Negative for chills and fever.  HENT: Negative.   Eyes: Negative.   Respiratory: Negative for shortness of breath.   Cardiovascular: Negative for chest pain.  Musculoskeletal: Positive for back pain and joint pain.  Skin: Positive for rash (Erythematous nodular rash on left antecubital fossa).    Allergies  Allergen Reactions  . Penicillins Anaphylaxis    Has patient had a PCN reaction causing immediate rash, facial/tongue/throat  swelling, SOB or lightheadedness with hypotension: Yes Has patient had a PCN reaction causing severe rash involving mucus membranes or skin necrosis: No Has patient had a PCN reaction that required hospitalization Yes Has patient had a PCN reaction occurring within the last 10 years: No If all of the above answers are "NO", then may proceed with Cephalosporin  Childhood PCN > described as chest closing/tightness.   Marland Kitchen Penicillins Anaphylaxis    Has patient had a PCN reaction causing immediate rash, facial/tongue/throat swelling, SOB or lightheadedness with hypotension: No Has patient had a PCN reaction causing severe  rash involving mucus membranes or skin necrosis: No Has patient had a PCN reaction that required hospitalization: Yes Has patient had a PCN reaction occurring within the last 10 years: No If all of the above answers are "NO", then may proceed with Cephalosporin use.    OBJECTIVE: Vitals:   07/28/20 1300 07/28/20 2217 07/29/20 0344 07/29/20 0759  BP: 137/88 (!) 167/93 123/67 (!) 154/91  Pulse: 62 73 (!) 58 71  Resp:  17 16 16   Temp: 98.5 F (36.9 C) 98.8 F (37.1 C) 97.9 F (36.6 C) (!) 97.4 F (36.3 C)  TempSrc: Oral Oral Oral Oral  SpO2:  98% 98% 97%  Weight:      Height:       Body mass index is 51.36 kg/m.  Physical Exam Constitutional:      General: He is not in acute distress. HENT:     Head: Normocephalic.  Eyes:     General: No scleral icterus.       Right eye: No discharge.        Left eye: No discharge.  Cardiovascular:     Rate and Rhythm: Normal rate and regular rhythm.     Heart sounds: No murmur heard.   Pulmonary:     Effort: No respiratory distress.     Breath sounds: Normal breath sounds.  Abdominal:     General: Bowel sounds are normal.  Musculoskeletal:     Cervical back: Normal range of motion.     Right lower leg: No edema.     Left lower leg: No edema.  Skin:    Comments: Multiple track marks and scars of bilateral forearm. Multiple erythematous nodules noted at left antecubital fossa, tender to palpation. A blister noted on right wrist  Neurological:     Mental Status: He is alert.  Psychiatric:        Mood and Affect: Mood normal.     Lab Results Lab Results  Component Value Date   WBC 6.4 07/29/2020   HGB 11.0 (L) 07/29/2020   HCT 35.2 (L) 07/29/2020   MCV 77.9 (L) 07/29/2020   PLT 363 07/29/2020    Lab Results  Component Value Date   CREATININE 0.94 07/29/2020   BUN 15 07/29/2020   NA 137 07/29/2020   K 4.3 07/29/2020   CL 103 07/29/2020   CO2 25 07/29/2020    Lab Results  Component Value Date   ALT 14 07/25/2020     AST 16 07/25/2020   ALKPHOS 109 07/25/2020   BILITOT 0.8 07/25/2020     Microbiology: Recent Results (from the past 240 hour(s))  Culture, blood (routine x 2)     Status: Abnormal   Collection Time: 07/24/20  6:37 PM   Specimen: BLOOD RIGHT ARM  Result Value Ref Range Status   Specimen Description   Final    BLOOD RIGHT ARM Performed at [facility],  [address] [place], [place] [address]    Special Requests   Final    BOTTLES DRAWN AEROBIC AND ANAEROBIC Blood Culture adequate volume Performed at [facility], [address] [place], [place] [address]    Culture  Setup Time   Final    GRAM POSITIVE COCCI IN CLUSTERS ANAEROBIC BOTTLE ONLY CRITICAL RESULT CALLED TO, READ BACK BY AND VERIFIED WITHCandiss Norse [facility] 2051 07/25/20 A BROWNING Performed at [facility], [address] [place], [place] [address]    Culture STAPHYLOCOCCUS AUREUS (A)  Final   Report Status 07/27/2020 FINAL  Final   Organism ID, Bacteria STAPHYLOCOCCUS AUREUS  Final      Susceptibility   Staphylococcus aureus - MIC*    CIPROFLOXACIN <=0.5 SENSITIVE Sensitive     ERYTHROMYCIN <=0.25 SENSITIVE Sensitive     GENTAMICIN <=0.5 SENSITIVE Sensitive     OXACILLIN 0.5 SENSITIVE Sensitive     TETRACYCLINE <=1 SENSITIVE Sensitive     VANCOMYCIN 1 SENSITIVE Sensitive     TRIMETH/SULFA <=10 SENSITIVE Sensitive     CLINDAMYCIN <=0.25 SENSITIVE Sensitive     RIFAMPIN <=0.5 SENSITIVE Sensitive     Inducible Clindamycin NEGATIVE Sensitive     * STAPHYLOCOCCUS AUREUS  Blood Culture ID Panel (Reflexed)     Status: Abnormal   Collection Time: 07/24/20  6:37 PM  Result Value Ref Range Status   Enterococcus faecalis NOT DETECTED NOT DETECTED Final   Enterococcus Faecium NOT DETECTED NOT DETECTED Final   Listeria monocytogenes NOT DETECTED NOT DETECTED Final   Staphylococcus species DETECTED (A) NOT DETECTED Final    Comment: CRITICAL RESULT CALLED TO, READ BACK BY AND VERIFIED  WITH: H VON [facility] PHARMD 2051 07/25/20 A BROWNING    Staphylococcus aureus (BCID) DETECTED (A) NOT DETECTED Final    Comment: CRITICAL RESULT CALLED TO, READ BACK BY AND VERIFIED WITH: H VON [facility] PHARMD 2051 07/25/20 A BROWNING    Staphylococcus epidermidis NOT DETECTED NOT DETECTED Final   Staphylococcus lugdunensis NOT DETECTED NOT DETECTED Final   Streptococcus species NOT DETECTED NOT DETECTED Final   Streptococcus agalactiae NOT DETECTED NOT DETECTED Final   Streptococcus pneumoniae NOT DETECTED NOT DETECTED Final   Streptococcus pyogenes NOT DETECTED NOT DETECTED Final   A.calcoaceticus-baumannii NOT DETECTED NOT DETECTED Final   Bacteroides fragilis NOT DETECTED NOT DETECTED Final   Enterobacterales NOT DETECTED NOT DETECTED Final   Enterobacter cloacae complex NOT DETECTED NOT DETECTED Final   Escherichia coli NOT DETECTED NOT DETECTED Final   Klebsiella aerogenes NOT DETECTED NOT DETECTED Final   Klebsiella oxytoca NOT DETECTED NOT DETECTED Final   Klebsiella pneumoniae NOT DETECTED NOT DETECTED Final   Proteus species NOT DETECTED NOT DETECTED Final   Salmonella species NOT DETECTED NOT DETECTED Final   Serratia marcescens NOT DETECTED NOT DETECTED Final   Haemophilus influenzae NOT DETECTED NOT DETECTED Final   Neisseria meningitidis NOT DETECTED NOT DETECTED Final   Pseudomonas aeruginosa NOT DETECTED NOT DETECTED Final   Stenotrophomonas maltophilia NOT DETECTED NOT DETECTED Final   Candida albicans NOT DETECTED NOT DETECTED Final   Candida auris NOT DETECTED NOT DETECTED Final   Candida glabrata NOT DETECTED NOT DETECTED Final   Candida krusei NOT DETECTED NOT DETECTED Final   Candida parapsilosis NOT DETECTED NOT DETECTED Final   Candida tropicalis NOT DETECTED NOT DETECTED Final   Cryptococcus neoformans/gattii NOT DETECTED NOT DETECTED Final   Meth resistant mecA/C and MREJ NOT DETECTED NOT DETECTED Final  Comment: Performed at [facility], [address] [place], [place] [address]  SARS Coronavirus 2 by RT PCR (hospital order, performed in [facility] hospital lab) Nasopharyngeal Peripheral     Status: None   Collection Time: 07/24/20  7:28 PM   Specimen: Peripheral; Nasopharyngeal  Result Value Ref Range Status   SARS Coronavirus 2 NEGATIVE NEGATIVE Final    Comment: (NOTE) SARS-CoV-2 target nucleic acids are NOT DETECTED.  The SARS-CoV-2 RNA is generally detectable in upper and lower respiratory specimens during the acute phase of infection. The lowest concentration of SARS-CoV-2 viral copies this assay can detect is 250 copies / mL. A negative result does not preclude SARS-CoV-2 infection and should not be used as the sole basis for treatment or other patient management decisions.  A negative result may occur with improper specimen collection / handling, submission of specimen other than nasopharyngeal swab, presence of viral mutation(s) within the areas targeted by this assay, and inadequate number of viral copies (<250 copies / mL). A negative result must be combined with clinical observations, patient history, and epidemiological information.  Fact Sheet for Patients:   BoilerBrush.com.cyhttps://www.fda.gov/media/136312/download  Fact Sheet for Healthcare Providers: https://pope.com/https://www.fda.gov/media/136313/download  This test is not yet approved or  cleared by the [place] FDA and has been authorized for detection and/or diagnosis of SARS-CoV-2 by FDA under an Emergency Use Authorization (EUA).  This EUA will remain in effect (meaning this test can be used) for the duration of the COVID-19 declaration under Section 564(b)(1) of the Act, 21 U.S.C. section 360bbb-3(b)(1), unless the authorization is terminated or revoked sooner.  Performed at [facility], [address] [place], [place] [address]   MRSA PCR Screening     Status: Abnormal   Collection Time: 07/25/20  4:21 AM   Specimen: Nasopharyngeal  Result Value Ref Range  Status   MRSA by PCR POSITIVE (A) NEGATIVE Final    Comment:        The GeneXpert MRSA Assay (FDA approved for NASAL specimens only), is one component of a comprehensive MRSA colonization surveillance program. It is not intended to diagnose MRSA infection nor to guide or monitor treatment for MRSA infections. RESULT CALLED TO, READ BACK BY AND VERIFIED WITH: RN Jasmine DecemberSHARON [facility] [phone] FCP Performed at [facility], [address] [place], [place] [address]   Culture, blood (Routine X 2) w Reflex to ID Panel     Status: None (Preliminary result)   Collection Time: 07/26/20  5:34 PM   Specimen: BLOOD LEFT ARM  Result Value Ref Range Status   Specimen Description BLOOD LEFT ARM  Final   Special Requests   Final    BOTTLES DRAWN AEROBIC AND ANAEROBIC Blood Culture adequate volume   Culture   Final    NO GROWTH 3 DAYS Performed at [facility], [address] [place], [place] [address]    Report Status PENDING  Incomplete  Culture, blood (Routine X 2) w Reflex to ID Panel     Status: Abnormal (Preliminary result)   Collection Time: 07/26/20  5:46 PM   Specimen: BLOOD LEFT HAND  Result Value Ref Range Status   Specimen Description BLOOD LEFT HAND  Final   Special Requests   Final    BOTTLES DRAWN AEROBIC ONLY Blood Culture adequate volume   Culture  Setup Time   Final    GRAM POSITIVE COCCI IN CLUSTERS GRAM POSITIVE COCCI IN CHAINS AEROBIC BOTTLE ONLY Organism  ID to follow CRITICAL RESULT CALLED TO, READ BACK BY AND VERIFIED WITH: G. ABBOTT,PHARMD 0405 07/28/2020 Girtha Hake Performed at [facility], [address] [place], [place] [address]    Culture STAPHYLOCOCCUS CAPITIS (A)  Final   Report Status PENDING  Incomplete  Blood Culture ID Panel (Reflexed)     Status: Abnormal   Collection Time: 07/26/20  5:46 PM  Result Value Ref Range Status   Enterococcus faecalis NOT DETECTED NOT DETECTED Final   Enterococcus Faecium NOT DETECTED NOT DETECTED  Final   Listeria monocytogenes NOT DETECTED NOT DETECTED Final   Staphylococcus species DETECTED (A) NOT DETECTED Final    Comment: CRITICAL RESULT CALLED TO, READ BACK BY AND VERIFIED WITH: G. ABBOTT,PHARMD 0405 07/28/2020 T. TYSOR    Staphylococcus aureus (BCID) NOT DETECTED NOT DETECTED Final   Staphylococcus epidermidis NOT DETECTED NOT DETECTED Final   Staphylococcus lugdunensis NOT DETECTED NOT DETECTED Final   Streptococcus species DETECTED (A) NOT DETECTED Final    Comment: Not Enterococcus species, Streptococcus agalactiae, Streptococcus pyogenes, or Streptococcus pneumoniae. CRITICAL RESULT CALLED TO, READ BACK BY AND VERIFIED WITH: G. ABBOTT,PHARMD 0405 07/28/2020 T. TYSOR    Streptococcus agalactiae NOT DETECTED NOT DETECTED Final   Streptococcus pneumoniae NOT DETECTED NOT DETECTED Final   Streptococcus pyogenes NOT DETECTED NOT DETECTED Final   A.calcoaceticus-baumannii NOT DETECTED NOT DETECTED Final   Bacteroides fragilis NOT DETECTED NOT DETECTED Final   Enterobacterales NOT DETECTED NOT DETECTED Final   Enterobacter cloacae complex NOT DETECTED NOT DETECTED Final   Escherichia coli NOT DETECTED NOT DETECTED Final   Klebsiella aerogenes NOT DETECTED NOT DETECTED Final   Klebsiella oxytoca NOT DETECTED NOT DETECTED Final   Klebsiella pneumoniae NOT DETECTED NOT DETECTED Final   Proteus species NOT DETECTED NOT DETECTED Final   Salmonella species NOT DETECTED NOT DETECTED Final   Serratia marcescens NOT DETECTED NOT DETECTED Final   Haemophilus influenzae NOT DETECTED NOT DETECTED Final   Neisseria meningitidis NOT DETECTED NOT DETECTED Final   Pseudomonas aeruginosa NOT DETECTED NOT DETECTED Final   Stenotrophomonas maltophilia NOT DETECTED NOT DETECTED Final   Candida albicans NOT DETECTED NOT DETECTED Final   Candida auris NOT DETECTED NOT DETECTED Final   Candida glabrata NOT DETECTED NOT DETECTED Final   Candida krusei NOT DETECTED NOT DETECTED Final   Candida  parapsilosis NOT DETECTED NOT DETECTED Final   Candida tropicalis NOT DETECTED NOT DETECTED Final   Cryptococcus neoformans/gattii NOT DETECTED NOT DETECTED Final    Comment: Performed at [facility], [address], [place], [place] [address]  Culture, blood (Routine X 2) w Reflex to ID Panel     Status: None (Preliminary result)   Collection Time: 07/27/20 11:10 AM   Specimen: BLOOD RIGHT ARM  Result Value Ref Range Status   Specimen Description BLOOD RIGHT ARM  Final   Special Requests   Final    BOTTLES DRAWN AEROBIC AND ANAEROBIC Blood Culture adequate volume   Culture   Final    NO GROWTH 2 DAYS Performed at [facility], [address], [place], [place] [address]    Report Status PENDING  Incomplete    Doran Stabler, [facility] [phone] pager   07/29/2020, 11:35 AM

## 2020-07-29 NOTE — Consult Note (Signed)
Reason for Consult: facial cellulitis  Referring Physician: Dr. Ginny Forth is an 44 y.o. 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 He has plans to stay in house for IV antibiotics pending TEE.  OMFS was consulted to manage dental infection.   Past Medical History:  Diagnosis Date  . Anxiety   . Chronic hip pain   . Degenerative joint disease   . Degenerative joint disease of left hip   . Hemorrhoids   . History of MSSA bacteremia 08/2017  . Hypertension   . IV drug abuse (HCC)   . Opioid abuse (HCC)   . Polysubstance abuse (HCC)   . PTSD (post-traumatic stress disorder)   . Sweating abnormality     Past Surgical History:  Procedure Laterality Date  . TEE WITHOUT CARDIOVERSION N/A 08/18/2017   Procedure: TRANSESOPHAGEAL ECHOCARDIOGRAM (TEE);  Surgeon: Lewayne Bunting, MD;  Location: [facility] ENDOSCOPY;  Service: Cardiovascular;  Laterality: N/A;  . TEE WITHOUT CARDIOVERSION  08/2017    Family History  Problem Relation Age of Onset  . Hypertension Mother   . Hypertension Father   . Lung cancer Maternal Uncle     Social History:  reports that he has been smoking cigarettes. He has a 21.00 pack-year smoking history. He has never used smokeless tobacco. He reports current drug use. Drugs: Heroin, Cocaine, Methaqualone, and IV. He reports that he does not drink alcohol.  Allergies:  Allergies  Allergen Reactions  . Penicillins Anaphylaxis    Has patient had a PCN reaction causing immediate rash, facial/tongue/throat swelling, SOB or lightheadedness with hypotension: Yes Has patient had a PCN reaction causing severe rash involving  mucus membranes or skin necrosis: No Has patient had a PCN reaction that required hospitalization Yes Has patient had a PCN reaction occurring within the last 10 years: No If all of the above answers are "NO", then may proceed with Cephalosporin  Childhood PCN > described as chest closing/tightness.   Marland Kitchen Penicillins Anaphylaxis    Has patient had a PCN reaction causing immediate rash, facial/tongue/throat swelling, SOB or lightheadedness with hypotension: No Has patient had a PCN reaction causing severe rash involving mucus membranes or skin necrosis: No Has patient had a PCN reaction that required hospitalization: Yes Has patient had a PCN reaction occurring within the last 10 years: No If all of the above answers are "NO", then may proceed with Cephalosporin use.    Medications: I have reviewed the patient's current medications.  Results for orders placed or performed during the hospital encounter of 07/24/20 (from the past 48 hour(s))  CBC     Status: Abnormal   Collection Time: 07/29/20  5:38 AM  Result Value Ref Range   WBC 6.4 4.0 - 10.5 K/uL   RBC 4.52 4.22 - 5.81 MIL/uL   Hemoglobin 11.0 (L) 13.0 - 17.0 g/dL   HCT 20.9 (L) 39 - 52 %   MCV 77.9 (L) 80.0 - 100.0 fL   MCH 24.3 (L) 26.0 - 34.0 pg   MCHC 31.3 30.0 - 36.0 g/dL   RDW 47.0 96.2 - 83.6 %   Platelets 363 150 - 400 K/uL   nRBC 0.0 0.0 - 0.2 %    Comment: Performed at [facility]  Hospital Lab, [address] [place], [place] [address]  Basic metabolic panel     Status: None   Collection Time: 07/29/20  5:38 AM  Result Value Ref Range   Sodium 137 135 - 145 mmol/L   Potassium 4.3 3.5 - 5.1 mmol/L    Comment: SPECIMEN HEMOLYZED. HEMOLYSIS MAY AFFECT INTEGRITY OF RESULTS.   Chloride 103 98 - 111 mmol/L   CO2 25 22 - 32 mmol/L   Glucose, Bld 94 70 - 99 mg/dL    Comment: Glucose reference range applies only to samples taken after fasting for at least 8 hours.   BUN 15 6 - 20 mg/dL   Creatinine, Ser 3.14 0.61 - 1.24 mg/dL    Calcium 9.3 8.9 - 97.0 mg/dL   GFR calc non Af Amer >60 >60 mL/min   GFR calc Af Amer >60 >60 mL/min   Anion gap 9 5 - 15    Comment: Performed at [facility], [address] [place], [place] [address]     Review of Systems  Constitutional: Negative.   HENT: Positive for dental problem and facial swelling.   Eyes: Negative.   Respiratory: Negative.   Cardiovascular: Negative.   Gastrointestinal: Negative.   Endocrine: Negative.   Genitourinary: Negative.   Musculoskeletal: Positive for arthralgias and back pain.  Skin: Positive for rash.  Allergic/Immunologic: Negative.   Neurological: Negative.   Hematological: Negative.   Psychiatric/Behavioral: Negative.    Blood pressure (!) 154/91, pulse 71, temperature (!) 97.4 F (36.3 C), temperature source Oral, resp. rate 16, height 5\' 6"  (1.676 m), weight (!) 144.3 kg, SpO2 97 %. Physical Exam  Maxillofacial Exam:  Neck soft, full ROM, no lymphadenopathy  TMJ without clicks or pops Minimal right middle 1/3 edema, soft, no fluctuance EOMI, PERRLA, VA/VF intact CN V, VII intact  Intraoral Exam: Retained roots #3, 4, 5, 6, 15 in the maxilla No vestibular fullness, no purulence expressed Teeth #2, 27 with class II mobility Tongue full ROM FOM non tender, non-elevated Oropharynx patent, uvula midline No soft tissue lesions noted    Assessment/Plan: 44yo M with bacteremia and with advanced dental disease and multiple carious teeth and associated right facial cellulitis, since resolved with IV antibiotics. CT scan with periapical radiolucency and potential subperiosteal abscess related to tooth #6, but this is not drainable at this time. Patient has no leukocytosis, vital signs stable. Unlikely that bacteremia is of dental origin. Would recommend continuing with current antibiotic regimen which has good coverage of oral flora. Upon discharge, patient can follow up in my office for extraction of remaining non-restorable  dentition to prevent future infections. Please call office for appointment [phone].   [phone] 07/29/2020, 1:36 PM

## 2020-07-29 NOTE — Progress Notes (Addendum)
Progress Note    Joshua Jacobs  WUJ:[id number] DOB: 08/02/1976  DOA: 07/24/2020 PCP: Patient, No Pcp Per    Brief Narrative:     Medical records reviewed and are as summarized below:  Joshua Jacobs is an 44 y.o. 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  Associated with severe pain.  States the pain caused him to relapse with his IV drug addiction.  Also reports a wound on his abdomen he noted 3 days ago.  Denies any bug bites.  [facility], CT maxillofacial revealed right facial cellulitis, probably odontogenic with infection source suspected to be carious right maxillary, subperiosteal abscess suspected near the root of the tooth.  CT abdomen and pelvis showing skin thickening with ill-defined edema involving the right lower anterior abdominal wall consistent with cellulitis.  Found to be bacteremia as well.    Assessment/Plan:   Principal Problem:   MSSA bacteremia Active Problems:   Chronic pain syndrome   Pain of both hip joints   Microcytic anemia   Facial cellulitis   Polysubstance dependence (HCC)   IV drug user   Hepatitis C infection   Dental infection   Lower back pain   Abdominal wall cellulitis   Facial and abdominal wall Cellulitis with staph aureus bacteremia -IV abx per ID -soft diet - MSSA - repeat blood cultures NGTD -TTE: Left ventricular ejection fraction, by estimation, is 60 to 65%. The left ventricle has normal function. The left ventricle has no regional wall motion abnormalities. The left ventricular internal cavity size was mildly dilated. Left ventricular diastolic parameters were normal. No vaveular issues noted -TEE planned for 9/16 -MRI of hip/spine w/o signs of infection but does show:  Severe age advanced bilateral hip joint degenerative changes, Mild to moderate facet hypertrophy at L2-3 through L5-S1, which could contribute to lower back pain left greater than right. CT maxillofacial: probably odontogenic with infection source suspected to be the carious right maxillary canine with a subtle/trace subperiosteal abscess suspected near the root of that tooth.- oral surgery consult placed -will need PICC Line and to stay in hospital for full treatment  ? Abscess on left upper arm/elbow -U/S ordered to r/o abscess -has been seen by ortho- Dr. Amanda PeaGramig and Casimiro NeedleMichael    IV drug user - has been counseled - UDS revealed opiates and cocaine   B/l arm swelling - likely related to poor vasculature related to chronic IV drug use  HTN - Amlodipine  obesity Body mass index is 51.36 kg/m.   Family Communication/Anticipated D/C date and plan/Code Status   DVT prophylaxis: scds Code Status: Full Code.  Disposition Plan: Status is: Inpatient  Remains inpatient appropriate because:Inpatient level of care appropriate due to severity of illness   Dispo: The patient is from: Home              Anticipated d/c is to: Home              Anticipated d/c date is: > 3 days              Patient currently is not medically stable to d/c.- will need to complete abx in HOSPITAL         Medical Consultants:    ID  Ortho  Oral surgery (LM  for Dr. Ross Marcus)   Subjective:   Tremulous today and asking for his home Neurontin dose  Objective:    Vitals:   07/28/20 1300 07/28/20 2217 07/29/20 0344 07/29/20 0759  BP: 137/88 (!) 167/93 123/67 (!) 154/91  Pulse: 62 73 (!) 58 71  Resp:  17 16 16   Temp: 98.5 F (36.9 C) 98.8 F (37.1 C) 97.9 F (36.6 C) (!) 97.4 F (36.3 C)  TempSrc: Oral Oral Oral Oral  SpO2:  98% 98% 97%  Weight:      Height:        Intake/Output Summary (Last 24 hours) at 07/29/2020 1111 Last data filed at 07/29/2020 0900 Gross per 24 hour  Intake  1470.81 ml  Output 1100 ml  Net 370.81 ml   Filed Weights   07/24/20 1811  Weight: (!) 144.3 kg    Exam:   General: Appearance:    Severely obese male in no acute distress     Lungs:     respirations unlabored  Heart:    Normal heart rate. Normal rhythm. No murmurs, rubs, or gallops.   MS:   All extremities are intact. Large swollen area in left antecubital fossa with small area or erythema above  Neurologic:   Awake, alert, oriented x 3. No apparent focal neurological           defect. Tremulous today    Data Reviewed:   I have personally reviewed following labs and imaging studies:  Labs: Labs show the following:   Basic Metabolic Panel: Recent Labs  Lab 07/24/20 1836 07/24/20 1836 07/25/20 1403 07/29/20 0538  NA 135  --  136 137  K 3.5   < > 3.9 4.3  CL 100  --  101 103  CO2 24  --  25 25  GLUCOSE 137*  --  101* 94  BUN 13  --  8 15  CREATININE 1.10  --  0.96 0.94  CALCIUM 9.4  --  9.2 9.3   < > = values in this interval not displayed.   GFR Estimated Creatinine Clearance: 140.4 mL/min (by C-G formula based on SCr of 0.94 mg/dL). Liver Function Tests: Recent Labs  Lab 07/24/20 1836 07/25/20 1403  AST 20 16  ALT 16 14  ALKPHOS 112 109  BILITOT 0.3 0.8  PROT 9.5* 8.1  ALBUMIN 4.2 3.5   No results for input(s): LIPASE, AMYLASE in the last 168 hours. No results for input(s): AMMONIA in the last 168 hours. Coagulation profile No results for input(s): INR, PROTIME in the last 168 hours.  CBC: Recent Labs  Lab 07/24/20 1836 07/25/20 1403 07/29/20 0538  WBC 12.9* 9.8 6.4  NEUTROABS 10.0* 6.6  --   HGB 12.3* 11.0* 11.0*  HCT 38.8* 34.9* 35.2*  MCV 76.4* 77.6* 77.9*  PLT 465* 406* 363   Cardiac Enzymes: No results for input(s): CKTOTAL, CKMB, CKMBINDEX, TROPONINI in the last 168 hours. BNP (last 3 results) No results for input(s): PROBNP in the last 8760 hours. CBG: No results for input(s): GLUCAP in the last 168 hours. D-Dimer: No results  for input(s): DDIMER in the last 72 hours. Hgb A1c: No results for input(s): HGBA1C in the last 72 hours. Lipid Profile: No results for input(s): CHOL, HDL, LDLCALC, TRIG, CHOLHDL, LDLDIRECT in the last 72 hours. Thyroid function studies: No results for input(s): TSH, T4TOTAL, T3FREE, THYROIDAB in the last 72 hours.  Invalid input(s): FREET3 Anemia work up: No results for input(s): VITAMINB12, FOLATE, FERRITIN, TIBC, IRON, RETICCTPCT in  the last 72 hours. Sepsis Labs: Recent Labs  Lab 07/24/20 1836 07/25/20 1403 07/29/20 0538  WBC 12.9* 9.8 6.4  LATICACIDVEN 1.4  --   --     Microbiology Recent Results (from the past 240 hour(s))  Culture, blood (routine x 2)     Status: Abnormal   Collection Time: 07/24/20  6:37 PM   Specimen: BLOOD RIGHT ARM  Result Value Ref Range Status   Specimen Description   Final    BLOOD RIGHT ARM Performed at [facility], [address] [place], [place] [address]    Special Requests   Final    BOTTLES DRAWN AEROBIC AND ANAEROBIC Blood Culture adequate volume Performed at [facility], [address] [place], [place] [address]    Culture  Setup Time   Final    GRAM POSITIVE COCCI IN CLUSTERS ANAEROBIC BOTTLE ONLY CRITICAL RESULT CALLED TO, READ BACK BY AND VERIFIED WITHCandiss Norse Devereux [facility] 2051 07/25/20 A BROWNING Performed at [facility], [address], [place], [place] [address]    Culture STAPHYLOCOCCUS AUREUS (A)  Final   Report Status 07/27/2020 FINAL  Final   Organism ID, Bacteria STAPHYLOCOCCUS AUREUS  Final      Susceptibility   Staphylococcus aureus - MIC*    CIPROFLOXACIN <=0.5 SENSITIVE Sensitive     ERYTHROMYCIN <=0.25 SENSITIVE Sensitive     GENTAMICIN <=0.5 SENSITIVE Sensitive     OXACILLIN 0.5 SENSITIVE Sensitive     TETRACYCLINE <=1 SENSITIVE Sensitive     VANCOMYCIN 1 SENSITIVE Sensitive     TRIMETH/SULFA <=10 SENSITIVE Sensitive     CLINDAMYCIN <=0.25 SENSITIVE Sensitive     RIFAMPIN  <=0.5 SENSITIVE Sensitive     Inducible Clindamycin NEGATIVE Sensitive     * STAPHYLOCOCCUS AUREUS  Blood Culture ID Panel (Reflexed)     Status: Abnormal   Collection Time: 07/24/20  6:37 PM  Result Value Ref Range Status   Enterococcus faecalis NOT DETECTED NOT DETECTED Final   Enterococcus Faecium NOT DETECTED NOT DETECTED Final   Listeria monocytogenes NOT DETECTED NOT DETECTED Final   Staphylococcus species DETECTED (A) NOT DETECTED Final    Comment: CRITICAL RESULT CALLED TO, READ BACK BY AND VERIFIED WITH: H VON [facility] PHARMD 2051 07/25/20 A BROWNING    Staphylococcus aureus (BCID) DETECTED (A) NOT DETECTED Final    Comment: CRITICAL RESULT CALLED TO, READ BACK BY AND VERIFIED WITH: H VON [facility] PHARMD 2051 07/25/20 A BROWNING    Staphylococcus epidermidis NOT DETECTED NOT DETECTED Final   Staphylococcus lugdunensis NOT DETECTED NOT DETECTED Final   Streptococcus species NOT DETECTED NOT DETECTED Final   Streptococcus agalactiae NOT DETECTED NOT DETECTED Final   Streptococcus pneumoniae NOT DETECTED NOT DETECTED Final   Streptococcus pyogenes NOT DETECTED NOT DETECTED Final   A.calcoaceticus-baumannii NOT DETECTED NOT DETECTED Final   Bacteroides fragilis NOT DETECTED NOT DETECTED Final   Enterobacterales NOT DETECTED NOT DETECTED Final   Enterobacter cloacae complex NOT DETECTED NOT DETECTED Final   Escherichia coli NOT DETECTED NOT DETECTED Final   Klebsiella aerogenes NOT DETECTED NOT DETECTED Final   Klebsiella oxytoca NOT DETECTED NOT DETECTED Final   Klebsiella pneumoniae NOT DETECTED NOT DETECTED Final   Proteus species NOT DETECTED NOT DETECTED Final   Salmonella species NOT DETECTED NOT DETECTED Final   Serratia marcescens NOT DETECTED NOT DETECTED Final   Haemophilus influenzae NOT DETECTED NOT DETECTED Final   Neisseria meningitidis NOT DETECTED NOT DETECTED Final  Pseudomonas aeruginosa NOT DETECTED NOT DETECTED Final   Stenotrophomonas maltophilia NOT DETECTED  NOT DETECTED Final   Candida albicans NOT DETECTED NOT DETECTED Final   Candida auris NOT DETECTED NOT DETECTED Final   Candida glabrata NOT DETECTED NOT DETECTED Final   Candida krusei NOT DETECTED NOT DETECTED Final   Candida parapsilosis NOT DETECTED NOT DETECTED Final   Candida tropicalis NOT DETECTED NOT DETECTED Final   Cryptococcus neoformans/gattii NOT DETECTED NOT DETECTED Final   Meth resistant mecA/C and MREJ NOT DETECTED NOT DETECTED Final    Comment: Performed at [facility], [address], [place], [place] [address]  SARS Coronavirus 2 by RT PCR (hospital order, performed in [facility] hospital lab) Nasopharyngeal Peripheral     Status: None   Collection Time: 07/24/20  7:28 PM   Specimen: Peripheral; Nasopharyngeal  Result Value Ref Range Status   SARS Coronavirus 2 NEGATIVE NEGATIVE Final    Comment: (NOTE) SARS-CoV-2 target nucleic acids are NOT DETECTED.  The SARS-CoV-2 RNA is generally detectable in upper and lower respiratory specimens during the acute phase of infection. The lowest concentration of SARS-CoV-2 viral copies this assay can detect is 250 copies / mL. A negative result does not preclude SARS-CoV-2 infection and should not be used as the sole basis for treatment or other patient management decisions.  A negative result may occur with improper specimen collection / handling, submission of specimen other than nasopharyngeal swab, presence of viral mutation(s) within the areas targeted by this assay, and inadequate number of viral copies (<250 copies / mL). A negative result must be combined with clinical observations, patient history, and epidemiological information.  Fact Sheet for Patients:   BoilerBrush.com.cy  Fact Sheet for Healthcare Providers: https://pope.com/  This test is not yet approved or  cleared by the [place] FDA and has been authorized for detection and/or diagnosis of  SARS-CoV-2 by FDA under an Emergency Use Authorization (EUA).  This EUA will remain in effect (meaning this test can be used) for the duration of the COVID-19 declaration under Section 564(b)(1) of the Act, 21 U.S.C. section 360bbb-3(b)(1), unless the authorization is terminated or revoked sooner.  Performed at [facility], [address] [place], [place] [address]   MRSA PCR Screening     Status: Abnormal   Collection Time: 07/25/20  4:21 AM   Specimen: Nasopharyngeal  Result Value Ref Range Status   MRSA by PCR POSITIVE (A) NEGATIVE Final    Comment:        The GeneXpert MRSA Assay (FDA approved for NASAL specimens only), is one component of a comprehensive MRSA colonization surveillance program. It is not intended to diagnose MRSA infection nor to guide or monitor treatment for MRSA infections. RESULT CALLED TO, READ BACK BY AND VERIFIED WITH: RN Jasmine December [facility] 1638 [phone] FCP Performed at [facility], [address] [place], [place] [address]   Culture, blood (Routine X 2) w Reflex to ID Panel     Status: None (Preliminary result)   Collection Time: 07/26/20  5:34 PM   Specimen: BLOOD LEFT ARM  Result Value Ref Range Status   Specimen Description BLOOD LEFT ARM  Final   Special Requests   Final    BOTTLES DRAWN AEROBIC AND ANAEROBIC Blood Culture adequate volume   Culture   Final    NO GROWTH 3 DAYS Performed at [facility], [address], [place], [place] [address]    Report Status PENDING  Incomplete  Culture, blood (Routine X 2) w Reflex to ID Panel     Status: Abnormal (Preliminary result)   Collection Time: 07/26/20  5:46 PM   Specimen: BLOOD LEFT HAND  Result Value Ref Range Status   Specimen Description BLOOD LEFT HAND  Final   Special Requests   Final    BOTTLES DRAWN AEROBIC ONLY Blood Culture adequate volume   Culture  Setup Time   Final    GRAM POSITIVE COCCI IN CLUSTERS GRAM POSITIVE COCCI IN CHAINS AEROBIC BOTTLE  ONLY Organism ID to follow CRITICAL RESULT CALLED TO, READ BACK BY AND VERIFIED WITH: G. ABBOTT,PHARMD 0405 07/28/2020 Girtha Hake Performed at [facility], [address], [place], [place] [address]    Culture STAPHYLOCOCCUS CAPITIS (A)  Final   Report Status PENDING  Incomplete  Blood Culture ID Panel (Reflexed)     Status: Abnormal   Collection Time: 07/26/20  5:46 PM  Result Value Ref Range Status   Enterococcus faecalis NOT DETECTED NOT DETECTED Final   Enterococcus Faecium NOT DETECTED NOT DETECTED Final   Listeria monocytogenes NOT DETECTED NOT DETECTED Final   Staphylococcus species DETECTED (A) NOT DETECTED Final    Comment: CRITICAL RESULT CALLED TO, READ BACK BY AND VERIFIED WITH: G. ABBOTT,PHARMD 0405 07/28/2020 T. TYSOR    Staphylococcus aureus (BCID) NOT DETECTED NOT DETECTED Final   Staphylococcus epidermidis NOT DETECTED NOT DETECTED Final   Staphylococcus lugdunensis NOT DETECTED NOT DETECTED Final   Streptococcus species DETECTED (A) NOT DETECTED Final    Comment: Not Enterococcus species, Streptococcus agalactiae, Streptococcus pyogenes, or Streptococcus pneumoniae. CRITICAL RESULT CALLED TO, READ BACK BY AND VERIFIED WITH: G. ABBOTT,PHARMD 0405 07/28/2020 T. TYSOR    Streptococcus agalactiae NOT DETECTED NOT DETECTED Final   Streptococcus pneumoniae NOT DETECTED NOT DETECTED Final   Streptococcus pyogenes NOT DETECTED NOT DETECTED Final   A.calcoaceticus-baumannii NOT DETECTED NOT DETECTED Final   Bacteroides fragilis NOT DETECTED NOT DETECTED Final   Enterobacterales NOT DETECTED NOT DETECTED Final   Enterobacter cloacae complex NOT DETECTED NOT DETECTED Final   Escherichia coli NOT DETECTED NOT DETECTED Final   Klebsiella aerogenes NOT DETECTED NOT DETECTED Final   Klebsiella oxytoca NOT DETECTED NOT DETECTED Final   Klebsiella pneumoniae NOT DETECTED NOT DETECTED Final   Proteus species NOT DETECTED NOT DETECTED Final   Salmonella species NOT DETECTED  NOT DETECTED Final   Serratia marcescens NOT DETECTED NOT DETECTED Final   Haemophilus influenzae NOT DETECTED NOT DETECTED Final   Neisseria meningitidis NOT DETECTED NOT DETECTED Final   Pseudomonas aeruginosa NOT DETECTED NOT DETECTED Final   Stenotrophomonas maltophilia NOT DETECTED NOT DETECTED Final   Candida albicans NOT DETECTED NOT DETECTED Final   Candida auris NOT DETECTED NOT DETECTED Final   Candida glabrata NOT DETECTED NOT DETECTED Final   Candida krusei NOT DETECTED NOT DETECTED Final   Candida parapsilosis NOT DETECTED NOT DETECTED Final   Candida tropicalis NOT DETECTED NOT DETECTED Final   Cryptococcus neoformans/gattii NOT DETECTED NOT DETECTED Final    Comment: Performed at [facility], [address], [place], [place] [address]  Culture, blood (Routine X 2) w Reflex to ID Panel     Status: None (Preliminary result)   Collection Time: 07/27/20 11:10 AM   Specimen: BLOOD RIGHT ARM  Result Value Ref Range Status   Specimen Description BLOOD RIGHT ARM  Final   Special Requests   Final    BOTTLES DRAWN AEROBIC AND ANAEROBIC Blood Culture adequate  volume   Culture   Final    NO GROWTH 2 DAYS Performed at [facility], [address], [place], [place] [address]    Report Status PENDING  Incomplete    Procedures and diagnostic studies:  MR LUMBAR SPINE WO CONTRAST  Result Date: 07/27/2020 CLINICAL DATA:  Initial evaluation for acute low back pain, infection suspected. History of MSSA bacteremia. EXAM: MRI LUMBAR SPINE WITHOUT CONTRAST TECHNIQUE: Multiplanar, multisequence MR imaging of the lumbar spine was performed. No intravenous contrast was administered. COMPARISON:  Prior MRI from 06/04/2019. FINDINGS: Segmentation: Standard. Lowest well-formed disc space labeled the L5-S1 level. Alignment: Physiologic with preservation of the normal of lumbar lordosis. No listhesis. Vertebrae: Vertebral body height maintained without acute or chronic fracture. Bone  marrow signal intensity within normal limits. 12 mm benign hemangioma noted within the right sacral ala. No other discrete or worrisome osseous lesions. No abnormal edema to suggest osteomyelitis discitis or septic arthritis. Visualized SI joints symmetric and within normal limits. Conus medullaris and cauda equina: Conus extends to the L1 level. Conus and cauda equina appear normal. Paraspinal and other soft tissues: Visualized paraspinous soft tissues within normal limits without edema. No soft tissue collections or inflammatory changes. Partially visualized visceral structures within normal limits. Disc levels: T12-L1: Seen only on sagittal projection. Disc bulge with disc desiccation and mild reactive endplate changes. Superimposed left subarticular to foraminal disc protrusion (series 9, image 9). Mild posterior element hypertrophy. No significant spinal stenosis. Foramina remain patent. T12-L1: Unremarkable. L1-2:  Unremarkable. L2-3: Normal interspace. Mild facet hypertrophy. No stenosis or impingement. L3-4: Normal interspace. Mild facet hypertrophy. No stenosis or impingement. L4-5: Disc desiccation with mild diffuse disc bulge. Superimposed small central disc protrusion with annular fissure. Mild to moderate facet and ligament flavum hypertrophy. Resultant mild spinal stenosis with mild left greater than right lateral recess narrowing. Mild left L4 foraminal stenosis. No significant right foraminal encroachment. L5-S1: Normal interspace. Moderate right with mild left facet hypertrophy. Mild epidural lipomatosis. No canal or foraminal stenosis. No impingement. IMPRESSION: 1. No MRI evidence for acute infection within the lumbar spine. 2. Small central disc protrusion and facet hypertrophy at L4-5 with resultant mild spinal stenosis, with mild left greater than right lateral recess narrowing. 3. Mild to moderate facet hypertrophy at L2-3 through L5-S1, which could contribute to lower back pain. 4. Left  subarticular to foraminal disc protrusion at T12-L1 without significant stenosis or neural impingement.\ Electronically Signed   By: Rise Mu M.D.   On: 07/27/2020 20:47   MR HIP RIGHT WO CONTRAST  Result Date: 07/28/2020 CLINICAL DATA:  Low back and right hip pain. EXAM: MR OF THE RIGHT HIP WITHOUT CONTRAST TECHNIQUE: Multiplanar, multisequence MR imaging was performed. No intravenous contrast was administered. COMPARISON:  CT scan 07/24/2020 FINDINGS: Examination limited by patient motion, body habitus and patient refusing to continue the examination. Severe age advanced bilateral hip joint degenerative changes, left greater than right. Advanced joint space narrowing, osteophytic spurring, bony eburnation and subchondral cystic change. Large subchondral cyst/geode 0 noted in the left femoral head. No findings for stress fracture or AVN. Small amount of hip joint fluid bilaterally but no overt joint effusion. No periarticular fluid collections. No significant peritendinitis or trochanteric bursitis. The pubic symphysis and SI joints are intact. No pelvic fractures or bone lesions. A small hemangioma is noted in the right sacrum is dental E. The surrounding hip and pelvic musculature is grossly normal. No muscle tear, myositis or mass. The hamstring tendons are  intact. No significant intrapelvic abnormalities are identified. IMPRESSION: 1. Severe age advanced bilateral hip joint degenerative changes, left greater than right. 2. No findings for stress fracture or AVN. 3. Intact bony pelvis. 4. No significant intrapelvic abnormalities. Electronically Signed   By: Rudie Meyer M.D.   On: 07/28/2020 05:38    Medications:   . amLODipine  5 mg Oral Daily  . chlorhexidine  15 mL Mouth/Throat QID  . Chlorhexidine Gluconate Cloth  6 each Topical Q0600  . FLUoxetine  20 mg Oral Daily  . gabapentin  900 mg Oral BID  . gabapentin  1,200 mg Oral QHS  . hydrOXYzine  100 mg Oral TID  . LORazepam  1 mg  Oral QHS  . mupirocin ointment  1 application Nasal BID   Continuous Infusions: . sodium chloride Stopped (07/27/20 1334)  .  ceFAZolin (ANCEF) IV 2 g (07/29/20 3267)  . clindamycin (CLEOCIN) IV 100 mL/hr at 07/29/20 0600     LOS: 4 days   Joseph Art  [facility]   How to contact the [facility] Attending or Consulting provider 7A - 7P or covering provider during after hours 7P -7A, for this patient?  1. Check the care team in [facility] and look for a) attending/consulting [facility] provider listed and b) the [facility] team listed 2. Log into [email or web address] and use [place]'s universal password to access. If you do not have the password, please contact the hospital operator. 3. Locate the [facility] provider you are looking for under [facility] and page to a number that you can be directly reached. 4. If you still have difficulty reaching the provider, please page the [facility] (Director on Call) for the Hospitalists listed on amion for assistance.  07/29/2020, 11:11 AM

## 2020-07-29 NOTE — Progress Notes (Signed)
Pt refused cardiac monitor at this time, he said his heart feels okay and agreed to put it back on later. MD updated.

## 2020-07-30 DIAGNOSIS — L02211 Cutaneous abscess of abdominal wall: Secondary | ICD-10-CM | POA: Diagnosis present

## 2020-07-30 DIAGNOSIS — I808 Phlebitis and thrombophlebitis of other sites: Secondary | ICD-10-CM | POA: Diagnosis present

## 2020-07-30 LAB — BASIC METABOLIC PANEL
Anion gap: 9 (ref 5–15)
BUN: 17 mg/dL (ref 6–20)
CO2: 25 mmol/L (ref 22–32)
Calcium: 9.2 mg/dL (ref 8.9–10.3)
Chloride: 103 mmol/L (ref 98–111)
Creatinine, Ser: 0.91 mg/dL (ref 0.61–1.24)
GFR calc Af Amer: >60 mL/min (ref >60)
GFR calc non Af Amer: >60 mL/min (ref >60)
Glucose, Bld: 95 mg/dL (ref 70–99)
Potassium: 4 mmol/L (ref 3.5–5.1)
Sodium: 137 mmol/L (ref 135–145)

## 2020-07-30 LAB — CBC
HCT: 35.3 % — ABNORMAL LOW (ref 39.0–52.0)
Hemoglobin: 10.9 g/dL — ABNORMAL LOW (ref 13.0–17.0)
MCH: 23.6 pg — ABNORMAL LOW (ref 26.0–34.0)
MCHC: 30.9 g/dL (ref 30.0–36.0)
MCV: 76.4 fL — ABNORMAL LOW (ref 80.0–100.0)
Platelets: 342 10*3/uL (ref 150–400)
RBC: 4.62 MIL/uL (ref 4.22–5.81)
RDW: 15.2 % (ref 11.5–15.5)
WBC: 6 10*3/uL (ref 4.0–10.5)
nRBC: 0 % (ref 0.0–0.2)

## 2020-07-30 LAB — HEPATITIS C VRS RNA DETECT BY PCR-QUAL: Hepatitis C Vrs RNA by PCR-Qual: NEGATIVE

## 2020-07-30 MED ORDER — GABAPENTIN 600 MG PO TABS
900.0000 mg | ORAL_TABLET | Freq: Every day | ORAL | Status: DC
  Administered 2020-07-30: 900 mg via ORAL
  Filled 2020-07-30 (×3): qty 1.5

## 2020-07-30 MED ORDER — GABAPENTIN 300 MG PO CAPS
900.0000 mg | ORAL_CAPSULE | Freq: Every morning | ORAL | Status: DC
  Administered 2020-07-30 – 2020-08-02 (×4): 900 mg via ORAL
  Filled 2020-07-30 (×3): qty 3

## 2020-07-30 NOTE — Progress Notes (Signed)
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  Reports pain caused him to relapse with his IV drug condition.  Also notable wound on his abdomen 3 days prior to urgent care presentation.  In the ED at [facility], CT maxillofacial revealed right facial cellulitis, probably odontogenic with infection source suspected to be carious right maxillary, subperiosteal abscess suspected near the root of the tooth.  CT abdomen and pelvis showing skin thickening with ill-defined edema involving the right lower anterior abdominal wall consistent with cellulitis.  Temperature 99.4, BP 180/107, WBC count 12.9 with neutrophilia. TRH asked to admit for cellulitis.  Started on IV antibiotics empirically in the ED, IV vancomycin.  Assessment & Plan:   Principal Problem:   MSSA bacteremia Active Problems:   Chronic pain syndrome   Pain of both hip joints   Microcytic anemia   Facial cellulitis   Polysubstance dependence (HCC)   IV drug user   Hepatitis C infection   Dental infection   Lower back pain   Abdominal wall cellulitis   Thrombophlebitis arm   Abdominal wall abscess   MSSA septicemia, present on admission Patient presenting with progressive right facial pain/swelling.  Was found to have an elevated WBC count of 12.9.  Etiology likely secondary to spontaneous rupture right lower quadrant abdominal wall abscess. --Infectious disease following, appreciate assistance --TEE planned for tomorrow --Continue antibiotics with clindamycin and cefazolin per ID; duration pending  findings of TEE --Supportive care  Right facial cellulitis Dental caries Patient with advanced dental disease and multiple caries associated with facial cellulitis.  CT with periapical radiolucency and potential subperiosteal abscess related to tooth #6 but not drainable at this time per OMFS.  Unlikely bacterial related to dental origin and would recommend continue current antibiotic regimen and follow-up outpatient for extraction of remaining nonrestorable dentition per Dr. Ross Marcus.  Left upper extremity nodules Patient with 2 small left upper extremity nodules that are erythematous and tender to palpation.  Ultrasound soft tissue on 07/29/2020 with ill-defined complex fluid collection 2.3 x 1.5 x 1.4 cm without drainable fluid collection or foreign body identified. --Continue to monitor areas closely  Essential hypertension BP 128/97 this morning, well controlled. --Continue amlodipine 5 mg p.o. daily --Hydralazine 5 mg IV every 4 hours as needed SBP greater than 170  Hx IV drug abuse, relapse UDS on admission positive for opiates/cocaine.  Counseled on need for complete cessation.  Anxiety:  --Continue fluoxetine 20 mg p.o. daily --Hydroxyzine 100 mg p.o. 3 times daily  Neuropathy --Gabapentin 900 mg 0700 and 1300 --Gabapentin 1200 mg nightly  Morbid obesity Body mass index is 51.36 kg/m.  Counseled on need for aggressive lifestyle changes/weight loss as this complicates all facets of care.   Tobacco use disorder Counseled on need for complete cessation.   DVT prophylaxis: SCDs Code Status: Full code Family Communication: Updated patient extensively at bedside  Disposition Plan:  Status is: Inpatient  Remains inpatient appropriate because:Ongoing diagnostic testing needed not appropriate for outpatient work up, Unsafe d/c plan, IV treatments appropriate due to intensity of illness or inability to take PO and Inpatient level  of care appropriate due to severity of  illness   Dispo: The patient is from: Home              Anticipated d/c is to: Home              Anticipated d/c date is: > 3 days              Patient currently is not medically stable to d/c.   Consultants:   Orthopedics, Dr. Amanda Pea  Infectious disease  O FMS, Dr. Ross Marcus  Procedures:   TTE  TEE pending for 07/31/2020  Antimicrobials:   Vancomycin  9/9 - 9/13  Clindamycin 9/9, 9/11>>  Cefazolin 9/13>>   Subjective: Patient seen and examined bedside, resting comfortably.  No complaints this morning.  Continues with nodule anterior aspect left upper arm, unchanged over the past few days.  Awaiting TEE planned for tomorrow.  Continues on IV antibiotics.  Denies headache, no chest pain, no palpitations, no shortness of breath, no fever/chills/night sweats, no nausea/vomiting/diarrhea, no weakness, no fatigue, no paresthesias.  No acute events overnight per nursing staff.  Objective: Vitals:   07/29/20 0759 07/29/20 1600 07/29/20 2229 07/30/20 0231  BP: (!) 154/91 (!) 138/96 (!) 109/52 (!) 128/97  Pulse: 71 69 65 76  Resp: Temp: (!) 97.4 F (36.3 C) 98.5 F (36.9 C) 98.8 F (37.1 C) 98 F (36.7 C)  TempSrc: Oral Oral Oral Oral  SpO2: 97% 100% 100% 99%  Weight:      Height:        Intake/Output Summary (Last 24 hours) at 07/30/2020 1430 Last data filed at 07/30/2020 1300 Gross per 24 hour  Intake 360 ml  Output 540 ml  Net -180 ml   Filed Weights   07/24/20 1811  Weight: (!) 144.3 kg    Examination:  General exam: Appears calm and comfortable, appears older than stated age, poor dentition Respiratory system: Clear to auscultation. Respiratory effort normal.  Oxygenating well on room air Cardiovascular system: S1 & S2 heard, RRR. No JVD, murmurs, rubs, gallops or clicks. No pedal edema. Gastrointestinal system: Abdomen is nondistended, soft and nontender. No organomegaly or masses felt. Normal bowel sounds heard. Central nervous system:  Alert and oriented. No focal neurological deficits. Extremities: Symmetric 5 x 5 power. Skin: two left anterior upper extremity nodule with surrounding erythema which are slightly painful to palpation, right lower quadrant cellulitis Psychiatry: Judgement and insight appear poor. Mood & affect appropriate.     Data Reviewed: I have personally reviewed following labs and imaging studies  CBC: Recent Labs  Lab 07/24/20 1836 07/25/20 1403 07/29/20 0538 07/30/20 0436  WBC 12.9* 9.8 6.4 6.0  NEUTROABS 10.0* 6.6  --   --   HGB 12.3* 11.0* 11.0* 10.9*  HCT 38.8* 34.9* 35.2* 35.3*  MCV 76.4* 77.6* 77.9* 76.4*  PLT 465* 406* 363 342   Basic Metabolic Panel: Recent Labs  Lab 07/24/20 1836 07/25/20 1403 07/29/20 0538 07/30/20 0436  NA 135 136 137 137  K 3.5 3.9 4.3 4.0  CL 100 101 103 103  CO2 GLUCOSE 137* 101* 94 95  BUN CREATININE 1.10 0.96 0.94 0.91  CALCIUM 9.4 9.2 9.3 9.2   GFR: Estimated Creatinine Clearance: 145.1 mL/min (by C-G formula based on SCr of 0.91 mg/dL). Liver Function Tests: Recent Labs  Lab 07/24/20 1836 07/25/20 1403  AST 20 16  ALT 16 14  ALKPHOS 112  109  BILITOT 0.3 0.8  PROT 9.5* 8.1  ALBUMIN 4.2 3.5   No results for input(s): LIPASE, AMYLASE in the last 168 hours. No results for input(s): AMMONIA in the last 168 hours. Coagulation Profile: No results for input(s): INR, PROTIME in the last 168 hours. Cardiac Enzymes: No results for input(s): CKTOTAL, CKMB, CKMBINDEX, TROPONINI in the last 168 hours. BNP (last 3 results) No results for input(s): PROBNP in the last 8760 hours. HbA1C: No results for input(s): HGBA1C in the last 72 hours. CBG: No results for input(s): GLUCAP in the last 168 hours. Lipid Profile: No results for input(s): CHOL, HDL, LDLCALC, TRIG, CHOLHDL, LDLDIRECT in the last 72 hours. Thyroid Function Tests: No results for input(s): TSH, T4TOTAL, FREET4, T3FREE, THYROIDAB in the last 72  hours. Anemia Panel: No results for input(s): VITAMINB12, FOLATE, FERRITIN, TIBC, IRON, RETICCTPCT in the last 72 hours. Sepsis Labs: Recent Labs  Lab 07/24/20 1836  LATICACIDVEN 1.4    Recent Results (from the past 240 hour(s))  Culture, blood (routine x 2)     Status: Abnormal   Collection Time: 07/24/20  6:37 PM   Specimen: BLOOD RIGHT ARM  Result Value Ref Range Status   Specimen Description   Final    BLOOD RIGHT ARM Performed at [facility], [address] [place], [place] [address]    Special Requests   Final    BOTTLES DRAWN AEROBIC AND ANAEROBIC Blood Culture adequate volume Performed at [facility], [address] [place], [place] [address]    Culture  Setup Time   Final    GRAM POSITIVE COCCI IN CLUSTERS ANAEROBIC BOTTLE ONLY CRITICAL RESULT CALLED TO, READ BACK BY AND VERIFIED WITHCandiss Norse [facility] 2051 07/25/20 A BROWNING Performed at [facility], [address] [place], [place] [address]    Culture STAPHYLOCOCCUS AUREUS (A)  Final   Report Status 07/27/2020 FINAL  Final   Organism ID, Bacteria STAPHYLOCOCCUS AUREUS  Final      Susceptibility   Staphylococcus aureus - MIC*    CIPROFLOXACIN <=0.5 SENSITIVE Sensitive     ERYTHROMYCIN <=0.25 SENSITIVE Sensitive     GENTAMICIN <=0.5 SENSITIVE Sensitive     OXACILLIN 0.5 SENSITIVE Sensitive     TETRACYCLINE <=1 SENSITIVE Sensitive     VANCOMYCIN 1 SENSITIVE Sensitive     TRIMETH/SULFA <=10 SENSITIVE Sensitive     CLINDAMYCIN <=0.25 SENSITIVE Sensitive     RIFAMPIN <=0.5 SENSITIVE Sensitive     Inducible Clindamycin NEGATIVE Sensitive     * STAPHYLOCOCCUS AUREUS  Blood Culture ID Panel (Reflexed)     Status: Abnormal   Collection Time: 07/24/20  6:37 PM  Result Value Ref Range Status   Enterococcus faecalis NOT DETECTED NOT DETECTED Final   Enterococcus Faecium NOT DETECTED NOT DETECTED Final   Listeria monocytogenes NOT DETECTED NOT DETECTED Final   Staphylococcus species  DETECTED (A) NOT DETECTED Final    Comment: CRITICAL RESULT CALLED TO, READ BACK BY AND VERIFIED WITHCandiss Norse [facility] PHARMD 2051 07/25/20 A BROWNING    Staphylococcus aureus (BCID) DETECTED (A) NOT DETECTED Final    Comment: CRITICAL RESULT CALLED TO, READ BACK BY AND VERIFIED WITH: H VON [facility] PHARMD 2051 07/25/20 A BROWNING    Staphylococcus epidermidis NOT DETECTED NOT DETECTED Final   Staphylococcus lugdunensis NOT DETECTED NOT DETECTED Final   Streptococcus species NOT DETECTED NOT DETECTED Final   Streptococcus agalactiae NOT DETECTED NOT DETECTED Final   Streptococcus  pneumoniae NOT DETECTED NOT DETECTED Final   Streptococcus pyogenes NOT DETECTED NOT DETECTED Final   A.calcoaceticus-baumannii NOT DETECTED NOT DETECTED Final   Bacteroides fragilis NOT DETECTED NOT DETECTED Final   Enterobacterales NOT DETECTED NOT DETECTED Final   Enterobacter cloacae complex NOT DETECTED NOT DETECTED Final   Escherichia coli NOT DETECTED NOT DETECTED Final   Klebsiella aerogenes NOT DETECTED NOT DETECTED Final   Klebsiella oxytoca NOT DETECTED NOT DETECTED Final   Klebsiella pneumoniae NOT DETECTED NOT DETECTED Final   Proteus species NOT DETECTED NOT DETECTED Final   Salmonella species NOT DETECTED NOT DETECTED Final   Serratia marcescens NOT DETECTED NOT DETECTED Final   Haemophilus influenzae NOT DETECTED NOT DETECTED Final   Neisseria meningitidis NOT DETECTED NOT DETECTED Final   Pseudomonas aeruginosa NOT DETECTED NOT DETECTED Final   Stenotrophomonas maltophilia NOT DETECTED NOT DETECTED Final   Candida albicans NOT DETECTED NOT DETECTED Final   Candida auris NOT DETECTED NOT DETECTED Final   Candida glabrata NOT DETECTED NOT DETECTED Final   Candida krusei NOT DETECTED NOT DETECTED Final   Candida parapsilosis NOT DETECTED NOT DETECTED Final   Candida tropicalis NOT DETECTED NOT DETECTED Final   Cryptococcus neoformans/gattii NOT DETECTED NOT DETECTED Final   Meth resistant mecA/C  and MREJ NOT DETECTED NOT DETECTED Final    Comment: Performed at [facility], [address] [place], [place] [address]  SARS Coronavirus 2 by RT PCR (hospital order, performed in [facility] hospital lab) Nasopharyngeal Peripheral     Status: None   Collection Time: 07/24/20  7:28 PM   Specimen: Peripheral; Nasopharyngeal  Result Value Ref Range Status   SARS Coronavirus 2 NEGATIVE NEGATIVE Final    Comment: (NOTE) SARS-CoV-2 target nucleic acids are NOT DETECTED.  The SARS-CoV-2 RNA is generally detectable in upper and lower respiratory specimens during the acute phase of infection. The lowest concentration of SARS-CoV-2 viral copies this assay can detect is 250 copies / mL. A negative result does not preclude SARS-CoV-2 infection and should not be used as the sole basis for treatment or other patient management decisions.  A negative result may occur with improper specimen collection / handling, submission of specimen other than nasopharyngeal swab, presence of viral mutation(s) within the areas targeted by this assay, and inadequate number of viral copies (<250 copies / mL). A negative result must be combined with clinical observations, patient history, and epidemiological information.  Fact Sheet for Patients:   BoilerBrush.com.cyhttps://www.fda.gov/media/136312/download  Fact Sheet for Healthcare Providers: https://pope.com/https://www.fda.gov/media/136313/download  This test is not yet approved or  cleared by the [place] FDA and has been authorized for detection and/or diagnosis of SARS-CoV-2 by FDA under an Emergency Use Authorization (EUA).  This EUA will remain in effect (meaning this test can be used) for the duration of the COVID-19 declaration under Section 564(b)(1) of the Act, 21 U.S.C. section 360bbb-3(b)(1), unless the authorization is terminated or revoked sooner.  Performed at [facility], [address] [place], [place] [address]   MRSA PCR Screening     Status:  Abnormal   Collection Time: 07/25/20  4:21 AM   Specimen: Nasopharyngeal  Result Value Ref Range Status   MRSA by PCR POSITIVE (A) NEGATIVE Final    Comment:        The GeneXpert MRSA Assay (FDA approved for NASAL specimens only), is one component of a comprehensive MRSA colonization surveillance program. It is not intended to diagnose MRSA infection nor to guide or  monitor treatment for MRSA infections. RESULT CALLED TO, READ BACK BY AND VERIFIED WITH: RN Jasmine December [facility] [phone] FCP Performed at [facility], [address] [place], [place] [address]   Culture, blood (Routine X 2) w Reflex to ID Panel     Status: None (Preliminary result)   Collection Time: 07/26/20  5:34 PM   Specimen: BLOOD LEFT ARM  Result Value Ref Range Status   Specimen Description BLOOD LEFT ARM  Final   Special Requests   Final    BOTTLES DRAWN AEROBIC AND ANAEROBIC Blood Culture adequate volume   Culture   Final    NO GROWTH 4 DAYS Performed at [facility], [address], [place], [place] [address]    Report Status PENDING  Incomplete  Culture, blood (Routine X 2) w Reflex to ID Panel     Status: Abnormal (Preliminary result)   Collection Time: 07/26/20  5:46 PM   Specimen: BLOOD LEFT HAND  Result Value Ref Range Status   Specimen Description BLOOD LEFT HAND  Final   Special Requests   Final    BOTTLES DRAWN AEROBIC ONLY Blood Culture adequate volume   Culture  Setup Time   Final    GRAM POSITIVE COCCI IN CLUSTERS GRAM POSITIVE COCCI IN CHAINS AEROBIC BOTTLE ONLY Organism ID to follow CRITICAL RESULT CALLED TO, READ BACK BY AND VERIFIED WITH: G. ABBOTT,PHARMD 0405 07/28/2020 Girtha Hake Performed at [facility], [address], [place], [place] [address]    Culture STAPHYLOCOCCUS CAPITIS (A)  Final   Report Status PENDING  Incomplete  Blood Culture ID Panel (Reflexed)     Status: Abnormal   Collection Time: 07/26/20  5:46 PM  Result Value Ref Range Status    Enterococcus faecalis NOT DETECTED NOT DETECTED Final   Enterococcus Faecium NOT DETECTED NOT DETECTED Final   Listeria monocytogenes NOT DETECTED NOT DETECTED Final   Staphylococcus species DETECTED (A) NOT DETECTED Final    Comment: CRITICAL RESULT CALLED TO, READ BACK BY AND VERIFIED WITH: G. ABBOTT,PHARMD 0405 07/28/2020 T. TYSOR    Staphylococcus aureus (BCID) NOT DETECTED NOT DETECTED Final   Staphylococcus epidermidis NOT DETECTED NOT DETECTED Final   Staphylococcus lugdunensis NOT DETECTED NOT DETECTED Final   Streptococcus species DETECTED (A) NOT DETECTED Final    Comment: Not Enterococcus species, Streptococcus agalactiae, Streptococcus pyogenes, or Streptococcus pneumoniae. CRITICAL RESULT CALLED TO, READ BACK BY AND VERIFIED WITH: G. ABBOTT,PHARMD 0405 07/28/2020 T. TYSOR    Streptococcus agalactiae NOT DETECTED NOT DETECTED Final   Streptococcus pneumoniae NOT DETECTED NOT DETECTED Final   Streptococcus pyogenes NOT DETECTED NOT DETECTED Final   A.calcoaceticus-baumannii NOT DETECTED NOT DETECTED Final   Bacteroides fragilis NOT DETECTED NOT DETECTED Final   Enterobacterales NOT DETECTED NOT DETECTED Final   Enterobacter cloacae complex NOT DETECTED NOT DETECTED Final   Escherichia coli NOT DETECTED NOT DETECTED Final   Klebsiella aerogenes NOT DETECTED NOT DETECTED Final   Klebsiella oxytoca NOT DETECTED NOT DETECTED Final   Klebsiella pneumoniae NOT DETECTED NOT DETECTED Final   Proteus species NOT DETECTED NOT DETECTED Final   Salmonella species NOT DETECTED NOT DETECTED Final   Serratia marcescens NOT DETECTED NOT DETECTED Final   Haemophilus influenzae NOT DETECTED NOT DETECTED Final   Neisseria meningitidis NOT DETECTED NOT DETECTED Final   Pseudomonas aeruginosa NOT DETECTED NOT DETECTED Final   Stenotrophomonas maltophilia NOT DETECTED NOT DETECTED Final   Candida albicans NOT DETECTED NOT DETECTED Final   Candida  auris NOT DETECTED NOT DETECTED Final    Candida glabrata NOT DETECTED NOT DETECTED Final   Candida krusei NOT DETECTED NOT DETECTED Final   Candida parapsilosis NOT DETECTED NOT DETECTED Final   Candida tropicalis NOT DETECTED NOT DETECTED Final   Cryptococcus neoformans/gattii NOT DETECTED NOT DETECTED Final    Comment: Performed at [facility], [address] [place], [place] [address]  Culture, blood (Routine X 2) w Reflex to ID Panel     Status: None (Preliminary result)   Collection Time: 07/27/20 11:10 AM   Specimen: BLOOD RIGHT ARM  Result Value Ref Range Status   Specimen Description BLOOD RIGHT ARM  Final   Special Requests   Final    BOTTLES DRAWN AEROBIC AND ANAEROBIC Blood Culture adequate volume   Culture   Final    NO GROWTH 3 DAYS Performed at [facility], [address] [place], [place] [address]    Report Status PENDING  Incomplete         Radiology Studies: [place] LIMITED JOINT SPACE STRUCTURES UP LEFT  Result Date: 07/29/2020 CLINICAL DATA:  Swelling and erythema in the medial left upper arm for 4-5 days. Question abscess. EXAM: ULTRASOUND LEFT UPPER EXTREMITY LIMITED TECHNIQUE: Ultrasound examination of the upper extremity soft tissues was performed in the area of clinical concern. COMPARISON:  None relevant. Outside ultrasound of the right upper arm performed 06/09/2020 is correlated. FINDINGS: There is an ill-defined complex fluid collection in the area of concern, measuring approximately 2.3 x 1.5 x 1.4 cm. There is edema interdigitating with subcutaneous fat in this region. There is surrounding increased vascularity on color Doppler consistent with hyperemia. No drainable fluid collection or foreign body identified. IMPRESSION: 1. Ill-defined complex fluid collection in the area of concern in the left upper arm with surrounding hypervascularity, suspicious for a small abscess. 2. No drainable fluid collection or foreign body identified. Electronically Signed   By: Carey Bullocks M.D.   On:  07/29/2020 13:01   [place] EKG SITE RITE  Result Date: 07/29/2020 If Site Rite image not attached, placement could not be confirmed due to current cardiac rhythm.       Scheduled Meds: . amLODipine  5 mg Oral Daily  . chlorhexidine  15 mL Mouth/Throat QID  . FLUoxetine  20 mg Oral Daily  . gabapentin  900 mg Oral q AM  . gabapentin  1,200 mg Oral QHS  . gabapentin  900 mg Oral Q1500  . hydrOXYzine  100 mg Oral TID  . LORazepam  1 mg Oral QHS   Continuous Infusions: . sodium chloride Stopped (07/27/20 1334)  .  ceFAZolin (ANCEF) IV 2 g (07/30/20 0549)  . clindamycin (CLEOCIN) IV 600 mg (07/30/20 0619)     LOS: 5 days    Time spent: 39 minutes spent on chart review, discussion with nursing staff, consultants, updating family and interview/physical exam; more than 50% of that time was spent in counseling and/or coordination of care.    Alvira Philips [place], DO [facility] Available via Epic secure chat 7am-7pm After these hours, please refer to coverage provider listed on [email or web address] 07/30/2020, 2:30 PM

## 2020-07-30 NOTE — Progress Notes (Signed)
    [facility] has been requested to perform a transesophageal echocardiogram on Joshua Jacobs for Bacteremia.  After careful review of history and examination, the risks and benefits of transesophageal echocardiogram have been explained including risks of esophageal damage, perforation (1:10,000 risk), bleeding, pharyngeal hematoma as well as other potential complications associated with conscious sedation including aspiration, arrhythmia, respiratory failure and death. Alternatives to treatment were discussed, questions were answered. Patient is willing to proceed.  TEE - Dr. Cristal Deer 07/31/20 @ 7:30am . NPO after midnight. Meds with sips.   Manson Passey, PA-C 07/30/2020 1:44 PM

## 2020-07-30 NOTE — Anesthesia Preprocedure Evaluation (Addendum)
Anesthesia Evaluation  Patient identified by MRN, date of birth, ID band Patient awake    Reviewed: Allergy & Precautions, NPO status , Patient's Chart, lab work & pertinent test results  History of Anesthesia Complications Negative for: history of anesthetic complications  Airway Mallampati: II  TM Distance: >3 FB Neck ROM: Full    Dental  (+) Edentulous Upper, Missing, Dental Advisory Given, Poor Dentition   Pulmonary Current Smoker,  07/24/2020 SARS coronavirus NEG   breath sounds clear to auscultation       Cardiovascular hypertension, Pt. on medications (-) angina Rhythm:Regular Rate:Normal     Neuro/Psych Anxiety negative neurological ROS     GI/Hepatic negative GI ROS, (+)     substance abuse  IV drug use, Hepatitis -  Endo/Other  Morbid obesity  Renal/GU negative Renal ROS     Musculoskeletal  (+) narcotic dependent  Abdominal (+) + obese,   Peds  Hematology  (+) Blood dyscrasia (Hb 10.9), anemia ,   Anesthesia Other Findings   Reproductive/Obstetrics                            Anesthesia Physical Anesthesia Plan  ASA: III  Anesthesia Plan: MAC   Post-op Pain Management:    Induction:   PONV Risk Score and Plan: 0  Airway Management Planned: Natural Airway and Nasal Cannula  Additional Equipment: None  Intra-op Plan:   Post-operative Plan:   Informed Consent: I have reviewed the patients History and Physical, chart, labs and discussed the procedure including the risks, benefits and alternatives for the proposed anesthesia with the patient or authorized representative who has indicated his/her understanding and acceptance.     Dental advisory given  Plan Discussed with: CRNA and Surgeon  Anesthesia Plan Comments:        Anesthesia Quick Evaluation

## 2020-07-30 NOTE — Plan of Care (Signed)
  Problem: Clinical Measurements: Goal: Ability to avoid or minimize complications of infection will improve Outcome: Progressing   Problem: Skin Integrity: Goal: Skin integrity will improve Outcome: Progressing   Problem: Education: Goal: Knowledge of General Education information will improve Description: Including pain rating scale, medication(s)/side effects and non-pharmacologic comfort measures Outcome: Progressing   

## 2020-07-30 NOTE — Progress Notes (Signed)
[facility]  Date of Admission:  07/24/2020    Total days of antibiotics: 6                                                                          Cefazolin         Day 3                                                                          Clindamycin    Day 5  ASSESSMENT: Joshua Jacobs has MSSA bacteremia secondary to abdominal wall cellulitis and recent soft tissue abscesses, and left facial cellulitis secondary to dental infection.  Patient appears clinically well.  Continue cefazolin for MSSA bacteremia and clindamycin for anaerobic coverage of dental infection.  Patient was seen by oral surgeon and is scheduled for an outpatient tooth extraction.  He is scheduled for TEE tomorrow which can help determine the duration of the IV antibiotics.  Depends on the result of the TEE, we will decide if he needs a PICC line or penicillin allergy skin test.  His nodular swelling on the left antecubital fossa appears stable.  Patient denies increase in size or worsening pain.  Ultrasound is negative for drainable fluid collection.  We will continue to observe clinically and continue the antibiotics.  If the nodules get bigger or worsening pain, can consider consult surgery.  PLAN: -Continue cefazolin and clindamycin, duration depends on TEE results -Planned TEE tomorrow -Pending HCV RNA quant result   Principal Problem:   MSSA bacteremia Active Problems:   Facial cellulitis   IV drug user   Hepatitis C infection   left antecubital fossa nodular rash   Chronic pain syndrome   Pain of both hip joints   Microcytic anemia   Polysubstance dependence (HCC)   Dental infection   Lower back pain   Abdominal wall cellulitis   Scheduled Meds: . amLODipine  5 mg Oral Daily  . chlorhexidine  15 mL Mouth/Throat QID  . FLUoxetine  20 mg Oral Daily  . gabapentin  900 mg Oral q AM  . gabapentin  1,200 mg Oral QHS  . gabapentin  900 mg Oral Q1500  . hydrOXYzine   100 mg Oral TID  . LORazepam  1 mg Oral QHS   Continuous Infusions: . sodium chloride Stopped (07/27/20 1334)  .  ceFAZolin (ANCEF) IV 2 g (07/30/20 0549)  . clindamycin (CLEOCIN) IV 600 mg (07/30/20 0619)   PRN Meds:.sodium chloride, acetaminophen, diclofenac Sodium, hydrALAZINE, hydrOXYzine, LORazepam **OR** LORazepam, morphine injection, tiZANidine   SUBJECTIVE: Patient was sitting up at bedside during examination.  He appears comfortable with no acute distress.  Patient states that he is feeling better today with no acute complaints.  He states that the nodular rash on his left antecubital fossa is not getting bigger.  The rash also is not as painful.  Review of Systems: Review of  Systems  Constitutional: Negative for chills and fever.  HENT: Negative.   Eyes: Negative.   Respiratory: Negative.   Cardiovascular: Negative.   Musculoskeletal: Positive for back pain and joint pain (Hip pain).  Skin: Positive for rash (Stable nodular rash on the left antecubital fossa.).  Neurological: Negative.     Allergies  Allergen Reactions  . Penicillins Anaphylaxis    Has patient had a PCN reaction causing immediate rash, facial/tongue/throat swelling, SOB or lightheadedness with hypotension: Yes Has patient had a PCN reaction causing severe rash involving mucus membranes or skin necrosis: No Has patient had a PCN reaction that required hospitalization Yes Has patient had a PCN reaction occurring within the last 10 years: No If all of the above answers are "NO", then may proceed with Cephalosporin  Childhood PCN > described as chest closing/tightness.   Marland Kitchen Penicillins Anaphylaxis    Has patient had a PCN reaction causing immediate rash, facial/tongue/throat swelling, SOB or lightheadedness with hypotension: No Has patient had a PCN reaction causing severe rash involving mucus membranes or skin necrosis: No Has patient had a PCN reaction that required hospitalization: Yes Has patient had  a PCN reaction occurring within the last 10 years: No If all of the above answers are "NO", then may proceed with Cephalosporin use.    OBJECTIVE: Vitals:   07/29/20 0759 07/29/20 1600 07/29/20 2229 07/30/20 0231  BP: (!) 154/91 (!) 138/96 (!) 109/52 (!) 128/97  Pulse: 71 69 65 76  Resp: 16 17 18 17   Temp: (!) 97.4 F (36.3 C) 98.5 F (36.9 C) 98.8 F (37.1 C) 98 F (36.7 C)  TempSrc: Oral Oral Oral Oral  SpO2: 97% 100% 100% 99%  Weight:      Height:       Body mass index is 51.36 kg/m.  Physical Exam Constitutional:      General: He is not in acute distress.    Appearance: He is obese.  Eyes:     General: No scleral icterus. Cardiovascular:     Rate and Rhythm: Normal rate and regular rhythm.     Heart sounds: No murmur heard.   Pulmonary:     Breath sounds: Normal breath sounds.  Abdominal:     General: Bowel sounds are normal.  Skin:    General: Skin is warm.     Comments: Right lower quadrant cellulitis: Wounds appear clean, no pus drainage. Left antecubital fossa: 2 nodules approximately 2 cm in diameter, erythematous, slightly painful to touch.  Neurological:     Mental Status: He is alert.  Psychiatric:        Mood and Affect: Mood normal.     Lab Results Lab Results  Component Value Date   WBC 6.0 07/30/2020   HGB 10.9 (L) 07/30/2020   HCT 35.3 (L) 07/30/2020   MCV 76.4 (L) 07/30/2020   PLT 342 07/30/2020    Lab Results  Component Value Date   CREATININE 0.91 07/30/2020   BUN 17 07/30/2020   NA 137 07/30/2020   K 4.0 07/30/2020   CL 103 07/30/2020   CO2 25 07/30/2020    Lab Results  Component Value Date   ALT 14 07/25/2020   AST 16 07/25/2020   ALKPHOS 109 07/25/2020   BILITOT 0.8 07/25/2020     Microbiology: Recent Results (from the past 240 hour(s))  Culture, blood (routine x 2)     Status: Abnormal   Collection Time: 07/24/20  6:37 PM   Specimen: BLOOD RIGHT ARM  Result  Value Ref Range Status   Specimen Description   Final     BLOOD RIGHT ARM Performed at [facility], [address] [place], [place] [address]    Special Requests   Final    BOTTLES DRAWN AEROBIC AND ANAEROBIC Blood Culture adequate volume Performed at [facility], [address] [place], [place] [address]    Culture  Setup Time   Final    GRAM POSITIVE COCCI IN CLUSTERS ANAEROBIC BOTTLE ONLY CRITICAL RESULT CALLED TO, READ BACK BY AND VERIFIED WITHCandiss Norse [facility] 2051 07/25/20 A BROWNING Performed at [facility], [address], [place], [place] [address]    Culture STAPHYLOCOCCUS AUREUS (A)  Final   Report Status 07/27/2020 FINAL  Final   Organism ID, Bacteria STAPHYLOCOCCUS AUREUS  Final      Susceptibility   Staphylococcus aureus - MIC*    CIPROFLOXACIN <=0.5 SENSITIVE Sensitive     ERYTHROMYCIN <=0.25 SENSITIVE Sensitive     GENTAMICIN <=0.5 SENSITIVE Sensitive     OXACILLIN 0.5 SENSITIVE Sensitive     TETRACYCLINE <=1 SENSITIVE Sensitive     VANCOMYCIN 1 SENSITIVE Sensitive     TRIMETH/SULFA <=10 SENSITIVE Sensitive     CLINDAMYCIN <=0.25 SENSITIVE Sensitive     RIFAMPIN <=0.5 SENSITIVE Sensitive     Inducible Clindamycin NEGATIVE Sensitive     * STAPHYLOCOCCUS AUREUS  Blood Culture ID Panel (Reflexed)     Status: Abnormal   Collection Time: 07/24/20  6:37 PM  Result Value Ref Range Status   Enterococcus faecalis NOT DETECTED NOT DETECTED Final   Enterococcus Faecium NOT DETECTED NOT DETECTED Final   Listeria monocytogenes NOT DETECTED NOT DETECTED Final   Staphylococcus species DETECTED (A) NOT DETECTED Final    Comment: CRITICAL RESULT CALLED TO, READ BACK BY AND VERIFIED WITH: H VON [facility] PHARMD 2051 07/25/20 A BROWNING    Staphylococcus aureus (BCID) DETECTED (A) NOT DETECTED Final    Comment: CRITICAL RESULT CALLED TO, READ BACK BY AND VERIFIED WITH: H VON [facility] PHARMD 2051 07/25/20 A BROWNING    Staphylococcus epidermidis NOT DETECTED NOT DETECTED Final   Staphylococcus lugdunensis  NOT DETECTED NOT DETECTED Final   Streptococcus species NOT DETECTED NOT DETECTED Final   Streptococcus agalactiae NOT DETECTED NOT DETECTED Final   Streptococcus pneumoniae NOT DETECTED NOT DETECTED Final   Streptococcus pyogenes NOT DETECTED NOT DETECTED Final   A.calcoaceticus-baumannii NOT DETECTED NOT DETECTED Final   Bacteroides fragilis NOT DETECTED NOT DETECTED Final   Enterobacterales NOT DETECTED NOT DETECTED Final   Enterobacter cloacae complex NOT DETECTED NOT DETECTED Final   Escherichia coli NOT DETECTED NOT DETECTED Final   Klebsiella aerogenes NOT DETECTED NOT DETECTED Final   Klebsiella oxytoca NOT DETECTED NOT DETECTED Final   Klebsiella pneumoniae NOT DETECTED NOT DETECTED Final   Proteus species NOT DETECTED NOT DETECTED Final   Salmonella species NOT DETECTED NOT DETECTED Final   Serratia marcescens NOT DETECTED NOT DETECTED Final   Haemophilus influenzae NOT DETECTED NOT DETECTED Final   Neisseria meningitidis NOT DETECTED NOT DETECTED Final   Pseudomonas aeruginosa NOT DETECTED NOT DETECTED Final   Stenotrophomonas maltophilia NOT DETECTED NOT DETECTED Final   Candida albicans NOT DETECTED NOT DETECTED Final   Candida auris NOT DETECTED NOT DETECTED Final   Candida glabrata NOT DETECTED NOT DETECTED Final   Candida krusei NOT DETECTED NOT DETECTED Final   Candida parapsilosis NOT DETECTED NOT DETECTED Final   Candida tropicalis NOT DETECTED  NOT DETECTED Final   Cryptococcus neoformans/gattii NOT DETECTED NOT DETECTED Final   Meth resistant mecA/C and MREJ NOT DETECTED NOT DETECTED Final    Comment: Performed at [facility], [address] [place], [place] [address]  SARS Coronavirus 2 by RT PCR (hospital order, performed in [facility] hospital lab) Nasopharyngeal Peripheral     Status: None   Collection Time: 07/24/20  7:28 PM   Specimen: Peripheral; Nasopharyngeal  Result Value Ref Range Status   SARS Coronavirus 2 NEGATIVE NEGATIVE Final    Comment:  (NOTE) SARS-CoV-2 target nucleic acids are NOT DETECTED.  The SARS-CoV-2 RNA is generally detectable in upper and lower respiratory specimens during the acute phase of infection. The lowest concentration of SARS-CoV-2 viral copies this assay can detect is 250 copies / mL. A negative result does not preclude SARS-CoV-2 infection and should not be used as the sole basis for treatment or other patient management decisions.  A negative result may occur with improper specimen collection / handling, submission of specimen other than nasopharyngeal swab, presence of viral mutation(s) within the areas targeted by this assay, and inadequate number of viral copies (<250 copies / mL). A negative result must be combined with clinical observations, patient history, and epidemiological information.  Fact Sheet for Patients:   BoilerBrush.com.cyhttps://www.fda.gov/media/136312/download  Fact Sheet for Healthcare Providers: https://pope.com/https://www.fda.gov/media/136313/download  This test is not yet approved or  cleared by the [place] FDA and has been authorized for detection and/or diagnosis of SARS-CoV-2 by FDA under an Emergency Use Authorization (EUA).  This EUA will remain in effect (meaning this test can be used) for the duration of the COVID-19 declaration under Section 564(b)(1) of the Act, 21 U.S.C. section 360bbb-3(b)(1), unless the authorization is terminated or revoked sooner.  Performed at [facility] [place], [address] [place], [place] [address]   MRSA PCR Screening     Status: Abnormal   Collection Time: 07/25/20  4:21 AM   Specimen: Nasopharyngeal  Result Value Ref Range Status   MRSA by PCR POSITIVE (A) NEGATIVE Final    Comment:        The GeneXpert MRSA Assay (FDA approved for NASAL specimens only), is one component of a comprehensive MRSA colonization surveillance program. It is not intended to diagnose MRSA infection nor to guide or monitor treatment for MRSA infections. RESULT  CALLED TO, READ BACK BY AND VERIFIED WITH: RN Jasmine DecemberSHARON [facility] [phone] FCP Performed at [facility], [address] [place], [place] [address]   Culture, blood (Routine X 2) w Reflex to ID Panel     Status: None (Preliminary result)   Collection Time: 07/26/20  5:34 PM   Specimen: BLOOD LEFT ARM  Result Value Ref Range Status   Specimen Description BLOOD LEFT ARM  Final   Special Requests   Final    BOTTLES DRAWN AEROBIC AND ANAEROBIC Blood Culture adequate volume   Culture   Final    NO GROWTH 3 DAYS Performed at [facility], [address] [place], [place] [address]    Report Status PENDING  Incomplete  Culture, blood (Routine X 2) w Reflex to ID Panel     Status: Abnormal (Preliminary result)   Collection Time: 07/26/20  5:46 PM   Specimen: BLOOD LEFT HAND  Result Value Ref Range Status   Specimen Description BLOOD LEFT HAND  Final   Special Requests   Final    BOTTLES DRAWN AEROBIC ONLY Blood Culture adequate  volume   Culture  Setup Time   Final    GRAM POSITIVE COCCI IN CLUSTERS GRAM POSITIVE COCCI IN CHAINS AEROBIC BOTTLE ONLY Organism ID to follow CRITICAL RESULT CALLED TO, READ BACK BY AND VERIFIED WITH: G. ABBOTT,PHARMD 0405 07/28/2020 Girtha Hake Performed at [facility], [address], [place], [place] [address]    Culture STAPHYLOCOCCUS CAPITIS (A)  Final   Report Status PENDING  Incomplete  Blood Culture ID Panel (Reflexed)     Status: Abnormal   Collection Time: 07/26/20  5:46 PM  Result Value Ref Range Status   Enterococcus faecalis NOT DETECTED NOT DETECTED Final   Enterococcus Faecium NOT DETECTED NOT DETECTED Final   Listeria monocytogenes NOT DETECTED NOT DETECTED Final   Staphylococcus species DETECTED (A) NOT DETECTED Final    Comment: CRITICAL RESULT CALLED TO, READ BACK BY AND VERIFIED WITH: G. ABBOTT,PHARMD 0405 07/28/2020 T. TYSOR    Staphylococcus aureus (BCID) NOT DETECTED NOT DETECTED Final   Staphylococcus epidermidis  NOT DETECTED NOT DETECTED Final   Staphylococcus lugdunensis NOT DETECTED NOT DETECTED Final   Streptococcus species DETECTED (A) NOT DETECTED Final    Comment: Not Enterococcus species, Streptococcus agalactiae, Streptococcus pyogenes, or Streptococcus pneumoniae. CRITICAL RESULT CALLED TO, READ BACK BY AND VERIFIED WITH: G. ABBOTT,PHARMD 0405 07/28/2020 T. TYSOR    Streptococcus agalactiae NOT DETECTED NOT DETECTED Final   Streptococcus pneumoniae NOT DETECTED NOT DETECTED Final   Streptococcus pyogenes NOT DETECTED NOT DETECTED Final   A.calcoaceticus-baumannii NOT DETECTED NOT DETECTED Final   Bacteroides fragilis NOT DETECTED NOT DETECTED Final   Enterobacterales NOT DETECTED NOT DETECTED Final   Enterobacter cloacae complex NOT DETECTED NOT DETECTED Final   Escherichia coli NOT DETECTED NOT DETECTED Final   Klebsiella aerogenes NOT DETECTED NOT DETECTED Final   Klebsiella oxytoca NOT DETECTED NOT DETECTED Final   Klebsiella pneumoniae NOT DETECTED NOT DETECTED Final   Proteus species NOT DETECTED NOT DETECTED Final   Salmonella species NOT DETECTED NOT DETECTED Final   Serratia marcescens NOT DETECTED NOT DETECTED Final   Haemophilus influenzae NOT DETECTED NOT DETECTED Final   Neisseria meningitidis NOT DETECTED NOT DETECTED Final   Pseudomonas aeruginosa NOT DETECTED NOT DETECTED Final   Stenotrophomonas maltophilia NOT DETECTED NOT DETECTED Final   Candida albicans NOT DETECTED NOT DETECTED Final   Candida auris NOT DETECTED NOT DETECTED Final   Candida glabrata NOT DETECTED NOT DETECTED Final   Candida krusei NOT DETECTED NOT DETECTED Final   Candida parapsilosis NOT DETECTED NOT DETECTED Final   Candida tropicalis NOT DETECTED NOT DETECTED Final   Cryptococcus neoformans/gattii NOT DETECTED NOT DETECTED Final    Comment: Performed at [facility], [address] [place], [place] [address]  Culture, blood (Routine X 2) w Reflex to ID Panel     Status: None  (Preliminary result)   Collection Time: 07/27/20 11:10 AM   Specimen: BLOOD RIGHT ARM  Result Value Ref Range Status   Specimen Description BLOOD RIGHT ARM  Final   Special Requests   Final    BOTTLES DRAWN AEROBIC AND ANAEROBIC Blood Culture adequate volume   Culture   Final    NO GROWTH 2 DAYS Performed at [facility], [address] [place], [place] [address]    Report Status PENDING  Incomplete    Doran Stabler, [facility] [phone] pager    07/30/2020, 10:39 AM

## 2020-07-31 ENCOUNTER — Encounter (HOSPITAL_COMMUNITY): Payer: Self-pay | Admitting: Internal Medicine

## 2020-07-31 ENCOUNTER — Encounter (HOSPITAL_COMMUNITY)
Admission: EM | Disposition: A | Discharge status: Home / Self Care | Payer: Self-pay | Source: Home / Self Care | Attending: Internal Medicine

## 2020-07-31 ENCOUNTER — Inpatient Hospital Stay (HOSPITAL_COMMUNITY): Payer: Self-pay | Admitting: Anesthesiology

## 2020-07-31 ENCOUNTER — Inpatient Hospital Stay (HOSPITAL_COMMUNITY): Payer: Self-pay

## 2020-07-31 DIAGNOSIS — R7881 Bacteremia: Secondary | ICD-10-CM

## 2020-07-31 DIAGNOSIS — I10 Essential (primary) hypertension: Secondary | ICD-10-CM

## 2020-07-31 HISTORY — PX: TEE WITHOUT CARDIOVERSION: SHX5443

## 2020-07-31 LAB — CULTURE, BLOOD (ROUTINE X 2)
Culture: NO GROWTH
Special Requests: ADEQUATE

## 2020-07-31 LAB — HEPATITIS C GENOTYPE

## 2020-07-31 SURGERY — ECHOCARDIOGRAM, TRANSESOPHAGEAL
Anesthesia: Monitor Anesthesia Care

## 2020-07-31 MED ORDER — MENTHOL 3 MG MT LOZG
1.0000 | LOZENGE | OROMUCOSAL | Status: DC | PRN
  Administered 2020-07-31: 3 mg via ORAL
  Filled 2020-07-31: qty 9

## 2020-07-31 MED ORDER — CLINDAMYCIN HCL 300 MG PO CAPS
450.0000 mg | ORAL_CAPSULE | Freq: Three times a day (TID) | ORAL | Status: DC
  Administered 2020-08-01: 450 mg via ORAL
  Filled 2020-07-31 (×3): qty 1

## 2020-07-31 MED ORDER — MIDAZOLAM HCL 5 MG/5ML IJ SOLN
INTRAMUSCULAR | Status: DC | PRN
  Administered 2020-07-31: 1 mg via INTRAVENOUS
  Administered 2020-07-31: 2 mg via INTRAVENOUS

## 2020-07-31 MED ORDER — PHENYLEPHRINE 40 MCG/ML (10ML) SYRINGE FOR IV PUSH (FOR BLOOD PRESSURE SUPPORT)
PREFILLED_SYRINGE | INTRAVENOUS | Status: DC | PRN
  Administered 2020-07-31: 80 ug via INTRAVENOUS

## 2020-07-31 MED ORDER — ALUM & MAG HYDROXIDE-SIMETH 200-200-20 MG/5ML PO SUSP
30.0000 mL | Freq: Four times a day (QID) | ORAL | Status: DC | PRN
  Administered 2020-07-31 – 2020-08-01 (×2): 30 mL via ORAL
  Filled 2020-07-31 (×2): qty 30

## 2020-07-31 MED ORDER — MORPHINE SULFATE (PF) 4 MG/ML IV SOLN
3.0000 mg | INTRAVENOUS | Status: DC | PRN
  Administered 2020-07-31 – 2020-08-02 (×12): 3 mg via INTRAVENOUS
  Filled 2020-07-31 (×12): qty 1

## 2020-07-31 MED ORDER — ORITAVANCIN DIPHOSPHATE 400 MG IV SOLR
1200.0000 mg | Freq: Once | INTRAVENOUS | Status: DC
  Filled 2020-07-31: qty 120

## 2020-07-31 MED ORDER — SODIUM CHLORIDE 0.9 % IV SOLN: INTRAVENOUS | Status: DC | PRN

## 2020-07-31 MED ORDER — PROPOFOL 500 MG/50ML IV EMUL
INTRAVENOUS | Status: DC | PRN
  Administered 2020-07-31: 100 ug/kg/min via INTRAVENOUS

## 2020-07-31 MED ORDER — CLINDAMYCIN PHOSPHATE 600 MG/50ML IV SOLN
600.0000 mg | Freq: Three times a day (TID) | INTRAVENOUS | Status: AC
  Administered 2020-07-31 (×2): 600 mg via INTRAVENOUS
  Filled 2020-07-31 (×2): qty 50

## 2020-07-31 MED ORDER — GABAPENTIN 300 MG PO CAPS
900.0000 mg | ORAL_CAPSULE | ORAL | Status: DC
  Administered 2020-07-31 – 2020-08-01 (×2): 900 mg via ORAL
  Filled 2020-07-31 (×3): qty 3

## 2020-07-31 NOTE — Anesthesia Procedure Notes (Signed)
Procedure Name: MAC Date/Time: 07/31/2020 7:45 AM Performed by: Leonor Liv, CRNA Pre-anesthesia Checklist: Patient identified, Emergency Drugs available, Suction available, Patient being monitored and Timeout performed Patient Re-evaluated:Patient Re-evaluated prior to induction Oxygen Delivery Method: Nasal cannula Airway Equipment and Method: Bite block Placement Confirmation: positive ETCO2

## 2020-07-31 NOTE — Plan of Care (Signed)
  Problem: Education: Goal: Knowledge of General Education information will improve Description: Including pain rating scale, medication(s)/side effects and non-pharmacologic comfort measures Outcome: Progressing   Problem: Clinical Measurements: Goal: Ability to maintain clinical measurements within normal limits will improve Outcome: Progressing   Problem: Nutrition: Goal: Adequate nutrition will be maintained Outcome: Progressing   Problem: Coping: Goal: Level of anxiety will decrease Outcome: Progressing   Problem: Elimination: Goal: Will not experience complications related to bowel motility Outcome: Progressing   Problem: Pain Managment: Goal: General experience of comfort will improve Outcome: Progressing   Problem: Safety: Goal: Ability to remain free from injury will improve Outcome: Progressing   Problem: Skin Integrity: Goal: Risk for impaired skin integrity will decrease Outcome: Progressing   

## 2020-07-31 NOTE — Plan of Care (Signed)
  Problem: Clinical Measurements: Goal: Ability to avoid or minimize complications of infection will improve Outcome: Progressing   Problem: Clinical Measurements: Goal: Ability to avoid or minimize complications of infection will improve Outcome: Progressing   Problem: Clinical Measurements: Goal: Ability to avoid or minimize complications of infection will improve Outcome: Progressing

## 2020-07-31 NOTE — Anesthesia Postprocedure Evaluation (Addendum)
Anesthesia Post Note  Patient: Philip Kotlyar  Procedure(s) Performed: TRANSESOPHAGEAL ECHOCARDIOGRAM (TEE) (N/A )     Patient location during evaluation: Endoscopy Anesthesia Type: MAC Level of consciousness: awake and alert, patient cooperative and oriented Pain management: pain level controlled Vital Signs Assessment: post-procedure vital signs reviewed and stable Respiratory status: spontaneous breathing, nonlabored ventilation and respiratory function stable Cardiovascular status: blood pressure returned to baseline and stable Postop Assessment: no apparent nausea or vomiting Anesthetic complications: no   No complications documented.  Last Vitals:  Vitals:   07/31/20 0844 07/31/20 0903  BP: (!) 146/95 (!) 140/95  Pulse: 74 72  Resp: 11 16  Temp:  36.5 C  SpO2: 96% 98%    Last Pain:  Vitals:   07/31/20 0903  TempSrc: Oral  PainSc:                  Sol Englert,E. Rilee Knoll

## 2020-07-31 NOTE — Transfer of Care (Signed)
Immediate Anesthesia Transfer of Care Note  Patient: Joshua Jacobs  Procedure(s) Performed: TRANSESOPHAGEAL ECHOCARDIOGRAM (TEE) (N/A )  Patient Location: Endoscopy Unit  Anesthesia Type:MAC  Level of Consciousness: drowsy  Airway & Oxygen Therapy: Patient Spontanous Breathing and Patient connected to nasal cannula oxygen  Post-op Assessment: Report given to RN, Post -op Vital signs reviewed and stable and Patient moving all extremities  Post vital signs: Reviewed and stable  Last Vitals:  Vitals Value Taken Time  BP 146/95 07/31/20 0844  Temp 36.2 C 07/31/20 0824  Pulse 73 07/31/20 0844  Resp 11 07/31/20 0844  SpO2 95 % 07/31/20 0844  Vitals shown include unvalidated device data.  Last Pain:  Vitals:   07/31/20 0824  TempSrc: Temporal  PainSc:       Patients Stated Pain Goal: 2 (38/75/64 3329)  Complications: No complications documented.

## 2020-07-31 NOTE — Progress Notes (Addendum)
[facility]  Date of Admission:  07/24/2020   Total days of antibiotics 7        Day 6 Cefazolin        Day 6 Clindamycin         ASSESSMENT: Mr. Oran has MSSA bacteremia secondary to abdominal wall cellulitis and recent soft tissue abscesses, and left facial cellulitis secondary to dental infection.  Patient underwent TEE this morning and results shows no vegetations.  Patient will need a 2-week course of antibiotics for his uncomplicated bacteremia.  We will attempt to perform penicillin allergy skin test tomorrow and will decide if patient can be started on Augmentin VS oritavancin. We will also switch from IV clindamycin to p.o. for total duration of 10 days.  Thrombophlebitis appears stable.  HCV RNA quant also came back negative.  PLAN: -Skin testing tomorrow.  Will decide to start Augmentin VS oritavancin depends on test result. -Start p.o. clindamycin tomorrow for total of 10 days. End date 08/06/2020 -Finish IV cefazolin and IV clindamycin today -Continue to monitor for the thrombophlebitis   Principal Problem:   MSSA bacteremia Active Problems:   Facial cellulitis   IV drug user   Hepatitis C infection   Chronic pain syndrome   Pain of both hip joints   Microcytic anemia   Polysubstance dependence (HCC)   Dental infection   Lower back pain   Abdominal wall cellulitis   Thrombophlebitis arm   Abdominal wall abscess   Scheduled Meds: . amLODipine  5 mg Oral Daily  . chlorhexidine  15 mL Mouth/Throat QID  . [START ON 08/01/2020] clindamycin  450 mg Oral Q8H  . FLUoxetine  20 mg Oral Daily  . gabapentin  900 mg Oral q AM  . gabapentin  900 mg Oral Q24H  . gabapentin  1,200 mg Oral QHS  . hydrOXYzine  100 mg Oral TID  . LORazepam  1 mg Oral QHS   Continuous Infusions: . sodium chloride Stopped (07/27/20 1334)  .  ceFAZolin (ANCEF) IV Stopped (07/31/20 3559)  . clindamycin (CLEOCIN) IV    . [START ON 08/01/2020] oritavancin  (ORBACTIV) IVPB     PRN Meds:.sodium chloride, acetaminophen, diclofenac Sodium, hydrALAZINE, hydrOXYzine, LORazepam **OR** LORazepam, morphine injection, tiZANidine   SUBJECTIVE: Patient is seen at bedside.  He is no acute distress.  He states that he still has back pain and hip pain.  Thrombophlebitis of left anticubital fossa is stable and not increase in size.  Pain is not worsening.  Patient underwent TEE this morning.  Results shows no vegetation.  Review of Systems: Review of Systems  Constitutional: Negative for chills and fever.  HENT: Negative.   Eyes: Negative.   Respiratory: Negative.   Cardiovascular: Negative.   Gastrointestinal: Negative.   Musculoskeletal: Positive for back pain and joint pain.    Allergies  Allergen Reactions  . Penicillins Anaphylaxis    Has patient had a PCN reaction causing immediate rash, facial/tongue/throat swelling, SOB or lightheadedness with hypotension: Yes Has patient had a PCN reaction causing severe rash involving mucus membranes or skin necrosis: No Has patient had a PCN reaction that required hospitalization Yes Has patient had a PCN reaction occurring within the last 10 years: No If all of the above answers are "NO", then may proceed with Cephalosporin  Childhood PCN > described as chest closing/tightness.   Marland Kitchen Penicillins Anaphylaxis    Has patient had a PCN reaction causing immediate rash, facial/tongue/throat  swelling, SOB or lightheadedness with hypotension: No Has patient had a PCN reaction causing severe rash involving mucus membranes or skin necrosis: No Has patient had a PCN reaction that required hospitalization: Yes Has patient had a PCN reaction occurring within the last 10 years: No If all of the above answers are "NO", then may proceed with Cephalosporin use.    OBJECTIVE: Vitals:   07/31/20 0824 07/31/20 0834 07/31/20 0844 07/31/20 0903  BP: 130/82 (!) 142/97 (!) 146/95 (!) 140/95  Pulse: 87 78 74 72  Resp: Temp: (!) 97.1 F (36.2 C)   97.7 F (36.5 C)  TempSrc: Temporal   Oral  SpO2: 96% 95% 96% 98%  Weight:      Height:       Body mass index is 51.36 kg/m.  Physical Exam Constitutional:      General: He is not in acute distress.    Appearance: He is obese.  HENT:     Head: Normocephalic.  Eyes:     General: No scleral icterus.       Right eye: No discharge.        Left eye: No discharge.  Cardiovascular:     Rate and Rhythm: Normal rate and regular rhythm.     Heart sounds: No murmur heard.   Pulmonary:     Effort: No respiratory distress.     Breath sounds: Normal breath sounds. No wheezing.  Abdominal:     General: Bowel sounds are normal.  Musculoskeletal:     Cervical back: Normal range of motion.     Right lower leg: No edema.     Left lower leg: No edema.     Comments: Stable thrombophlebitis of left arm.  No increase in size  Neurological:     Mental Status: He is alert.  Psychiatric:        Mood and Affect: Mood normal.     Lab Results Lab Results  Component Value Date   WBC 6.0 07/30/2020   HGB 10.9 (L) 07/30/2020   HCT 35.3 (L) 07/30/2020   MCV 76.4 (L) 07/30/2020   PLT 342 07/30/2020    Lab Results  Component Value Date   CREATININE 0.91 07/30/2020   BUN 17 07/30/2020   NA 137 07/30/2020   K 4.0 07/30/2020   CL 103 07/30/2020   CO2 25 07/30/2020    Lab Results  Component Value Date   ALT 14 07/25/2020   AST 16 07/25/2020   ALKPHOS 109 07/25/2020   BILITOT 0.8 07/25/2020     Microbiology: Recent Results (from the past 240 hour(s))  Culture, blood (routine x 2)     Status: Abnormal   Collection Time: 07/24/20  6:37 PM   Specimen: BLOOD RIGHT ARM  Result Value Ref Range Status   Specimen Description   Final    BLOOD RIGHT ARM Performed at [facility], [address] [place], [place] [address]    Special Requests   Final    BOTTLES DRAWN AEROBIC AND ANAEROBIC Blood Culture adequate volume Performed at [facility], [address] [place], [place] [address]    Culture  Setup Time   Final    GRAM POSITIVE COCCI IN CLUSTERS ANAEROBIC BOTTLE ONLY CRITICAL RESULT CALLED TO, READ BACK BY AND VERIFIED WITHCandiss Norse [facility] 2051 07/25/20 A BROWNING Performed at [facility], [address] [place], [place] [address]    Culture  STAPHYLOCOCCUS AUREUS (A)  Final   Report Status 07/27/2020 FINAL  Final   Organism ID, Bacteria STAPHYLOCOCCUS AUREUS  Final      Susceptibility   Staphylococcus aureus - MIC*    CIPROFLOXACIN <=0.5 SENSITIVE Sensitive     ERYTHROMYCIN <=0.25 SENSITIVE Sensitive     GENTAMICIN <=0.5 SENSITIVE Sensitive     OXACILLIN 0.5 SENSITIVE Sensitive     TETRACYCLINE <=1 SENSITIVE Sensitive     VANCOMYCIN 1 SENSITIVE Sensitive     TRIMETH/SULFA <=10 SENSITIVE Sensitive     CLINDAMYCIN <=0.25 SENSITIVE Sensitive     RIFAMPIN <=0.5 SENSITIVE Sensitive     Inducible Clindamycin NEGATIVE Sensitive     * STAPHYLOCOCCUS AUREUS  Blood Culture ID Panel (Reflexed)     Status: Abnormal   Collection Time: 07/24/20  6:37 PM  Result Value Ref Range Status   Enterococcus faecalis NOT DETECTED NOT DETECTED Final   Enterococcus Faecium NOT DETECTED NOT DETECTED Final   Listeria monocytogenes NOT DETECTED NOT DETECTED Final   Staphylococcus species DETECTED (A) NOT DETECTED Final    Comment: CRITICAL RESULT CALLED TO, READ BACK BY AND VERIFIED WITH: H [facility] PHARMD 2051 07/25/20 A BROWNING    Staphylococcus aureus (BCID) DETECTED (A) NOT DETECTED Final    Comment: CRITICAL RESULT CALLED TO, READ BACK BY AND VERIFIED WITH: H [facility] PHARMD 2051 07/25/20 A BROWNING    Staphylococcus epidermidis NOT DETECTED NOT DETECTED Final   Staphylococcus lugdunensis NOT DETECTED NOT DETECTED Final   Streptococcus species NOT DETECTED NOT DETECTED Final   Streptococcus agalactiae NOT DETECTED NOT DETECTED Final   Streptococcus pneumoniae NOT DETECTED NOT DETECTED Final    Streptococcus pyogenes NOT DETECTED NOT DETECTED Final   A.calcoaceticus-baumannii NOT DETECTED NOT DETECTED Final   Bacteroides fragilis NOT DETECTED NOT DETECTED Final   Enterobacterales NOT DETECTED NOT DETECTED Final   Enterobacter cloacae complex NOT DETECTED NOT DETECTED Final   Escherichia coli NOT DETECTED NOT DETECTED Final   Klebsiella aerogenes NOT DETECTED NOT DETECTED Final   Klebsiella oxytoca NOT DETECTED NOT DETECTED Final   Klebsiella pneumoniae NOT DETECTED NOT DETECTED Final   Proteus species NOT DETECTED NOT DETECTED Final   Salmonella species NOT DETECTED NOT DETECTED Final   Serratia marcescens NOT DETECTED NOT DETECTED Final   Haemophilus influenzae NOT DETECTED NOT DETECTED Final   Neisseria meningitidis NOT DETECTED NOT DETECTED Final   Pseudomonas aeruginosa NOT DETECTED NOT DETECTED Final   Stenotrophomonas maltophilia NOT DETECTED NOT DETECTED Final   Candida albicans NOT DETECTED NOT DETECTED Final   Candida auris NOT DETECTED NOT DETECTED Final   Candida glabrata NOT DETECTED NOT DETECTED Final   Candida krusei NOT DETECTED NOT DETECTED Final   Candida parapsilosis NOT DETECTED NOT DETECTED Final   Candida tropicalis NOT DETECTED NOT DETECTED Final   Cryptococcus neoformans/gattii NOT DETECTED NOT DETECTED Final   Meth resistant mecA/C and MREJ NOT DETECTED NOT DETECTED Final    Comment: Performed at [facility], [address], [place], [place] [address]  SARS Coronavirus 2 by RT PCR (hospital order, performed in [facility] hospital lab) Nasopharyngeal Peripheral     Status: None   Collection Time: 07/24/20  7:28 PM   Specimen: Peripheral; Nasopharyngeal  Result Value Ref Range Status   SARS Coronavirus 2 NEGATIVE NEGATIVE Final    Comment: (NOTE) SARS-CoV-2 target nucleic acids are NOT DETECTED.  The SARS-CoV-2 RNA is generally detectable in upper and lower respiratory specimens during the acute phase of infection.  The lowest concentration of  SARS-CoV-2 viral copies this assay can detect is 250 copies / mL. A negative result does not preclude SARS-CoV-2 infection and should not be used as the sole basis for treatment or other patient management decisions.  A negative result may occur with improper specimen collection / handling, submission of specimen other than nasopharyngeal swab, presence of viral mutation(s) within the areas targeted by this assay, and inadequate number of viral copies (<250 copies / mL). A negative result must be combined with clinical observations, patient history, and epidemiological information.  Fact Sheet for Patients:   BoilerBrush.com.cy  Fact Sheet for Healthcare Providers: https://pope.com/  This test is not yet approved or  cleared by the [place] FDA and has been authorized for detection and/or diagnosis of SARS-CoV-2 by FDA under an Emergency Use Authorization (EUA).  This EUA will remain in effect (meaning this test can be used) for the duration of the COVID-19 declaration under Section 564(b)(1) of the Act, 21 U.S.C. section 360bbb-3(b)(1), unless the authorization is terminated or revoked sooner.  Performed at [facility], [address] [place], [place] [address]   MRSA PCR Screening     Status: Abnormal   Collection Time: 07/25/20  4:21 AM   Specimen: Nasopharyngeal  Result Value Ref Range Status   MRSA by PCR POSITIVE (A) NEGATIVE Final    Comment:        The GeneXpert MRSA Assay (FDA approved for NASAL specimens only), is one component of a comprehensive MRSA colonization surveillance program. It is not intended to diagnose MRSA infection nor to guide or monitor treatment for MRSA infections. RESULT CALLED TO, READ BACK BY AND VERIFIED WITH: RN Jasmine December [facility] 6045 [phone] FCP Performed at [facility], [address], [place], [place] [address]   Culture, blood (Routine X 2) w Reflex to ID Panel      Status: None (Preliminary result)   Collection Time: 07/26/20  5:34 PM   Specimen: BLOOD LEFT ARM  Result Value Ref Range Status   Specimen Description BLOOD LEFT ARM  Final   Special Requests   Final    BOTTLES DRAWN AEROBIC AND ANAEROBIC Blood Culture adequate volume   Culture   Final    NO GROWTH 4 DAYS Performed at [facility], [address], [place], [place] [address]    Report Status PENDING  Incomplete  Culture, blood (Routine X 2) w Reflex to ID Panel     Status: Abnormal (Preliminary result)   Collection Time: 07/26/20  5:46 PM   Specimen: BLOOD LEFT HAND  Result Value Ref Range Status   Specimen Description BLOOD LEFT HAND  Final   Special Requests   Final    BOTTLES DRAWN AEROBIC ONLY Blood Culture adequate volume   Culture  Setup Time   Final    GRAM POSITIVE COCCI IN CLUSTERS GRAM POSITIVE COCCI IN CHAINS AEROBIC BOTTLE ONLY Organism ID to follow CRITICAL RESULT CALLED TO, READ BACK BY AND VERIFIED WITH: G. ABBOTT,PHARMD 0405 07/28/2020 Girtha Hake Performed at [facility], [address] [place], [place] [address]    Culture STAPHYLOCOCCUS CAPITIS (A)  Final   Report Status PENDING  Incomplete  Blood Culture ID Panel (Reflexed)     Status: Abnormal   Collection Time: 07/26/20  5:46 PM  Result Value Ref Range Status   Enterococcus faecalis NOT DETECTED NOT DETECTED Final   Enterococcus Faecium NOT DETECTED NOT DETECTED Final   Listeria  monocytogenes NOT DETECTED NOT DETECTED Final   Staphylococcus species DETECTED (A) NOT DETECTED Final    Comment: CRITICAL RESULT CALLED TO, READ BACK BY AND VERIFIED WITH: G. ABBOTT,PHARMD 0405 07/28/2020 T. TYSOR    Staphylococcus aureus (BCID) NOT DETECTED NOT DETECTED Final   Staphylococcus epidermidis NOT DETECTED NOT DETECTED Final   Staphylococcus lugdunensis NOT DETECTED NOT DETECTED Final   Streptococcus species DETECTED (A) NOT DETECTED Final    Comment: Not Enterococcus species, Streptococcus agalactiae,  Streptococcus pyogenes, or Streptococcus pneumoniae. CRITICAL RESULT CALLED TO, READ BACK BY AND VERIFIED WITH: G. ABBOTT,PHARMD 0405 07/28/2020 T. TYSOR    Streptococcus agalactiae NOT DETECTED NOT DETECTED Final   Streptococcus pneumoniae NOT DETECTED NOT DETECTED Final   Streptococcus pyogenes NOT DETECTED NOT DETECTED Final   A.calcoaceticus-baumannii NOT DETECTED NOT DETECTED Final   Bacteroides fragilis NOT DETECTED NOT DETECTED Final   Enterobacterales NOT DETECTED NOT DETECTED Final   Enterobacter cloacae complex NOT DETECTED NOT DETECTED Final   Escherichia coli NOT DETECTED NOT DETECTED Final   Klebsiella aerogenes NOT DETECTED NOT DETECTED Final   Klebsiella oxytoca NOT DETECTED NOT DETECTED Final   Klebsiella pneumoniae NOT DETECTED NOT DETECTED Final   Proteus species NOT DETECTED NOT DETECTED Final   Salmonella species NOT DETECTED NOT DETECTED Final   Serratia marcescens NOT DETECTED NOT DETECTED Final   Haemophilus influenzae NOT DETECTED NOT DETECTED Final   Neisseria meningitidis NOT DETECTED NOT DETECTED Final   Pseudomonas aeruginosa NOT DETECTED NOT DETECTED Final   Stenotrophomonas maltophilia NOT DETECTED NOT DETECTED Final   Candida albicans NOT DETECTED NOT DETECTED Final   Candida auris NOT DETECTED NOT DETECTED Final   Candida glabrata NOT DETECTED NOT DETECTED Final   Candida krusei NOT DETECTED NOT DETECTED Final   Candida parapsilosis NOT DETECTED NOT DETECTED Final   Candida tropicalis NOT DETECTED NOT DETECTED Final   Cryptococcus neoformans/gattii NOT DETECTED NOT DETECTED Final    Comment: Performed at [facility], [address] [place], [place] [id number]  Culture, blood (Routine X 2) w Reflex to ID Panel     Status: None (Preliminary result)   Collection Time: 07/27/20 11:10 AM   Specimen: BLOOD RIGHT ARM  Result Value Ref Range Status   Specimen Description BLOOD RIGHT ARM  Final   Special Requests   Final    BOTTLES DRAWN AEROBIC AND  ANAEROBIC Blood Culture adequate volume   Culture   Final    NO GROWTH 3 DAYS Performed at [facility], [address] [place], [place] [address]    Report Status PENDING  Incomplete    Doran StablerQuan  Anastasiya Gowin, [facility] [phone] pager   07/31/2020, 1:17 PM

## 2020-07-31 NOTE — Progress Notes (Addendum)
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  Reports pain caused him to relapse with his IV drug condition.  Also notable wound on his abdomen 3 days prior to urgent care presentation.  In the ED at [facility], CT maxillofacial revealed right facial cellulitis, probably odontogenic with infection source suspected to be carious right maxillary, subperiosteal abscess suspected near the root of the tooth.  CT abdomen and pelvis showing skin thickening with ill-defined edema involving the right lower anterior abdominal wall consistent with cellulitis.  Temperature 99.4, BP 180/107, WBC count 12.9 with neutrophilia. TRH asked to admit for cellulitis.  Started on IV antibiotics empirically in the ED, IV vancomycin.  Assessment & Plan:   Principal Problem:   MSSA bacteremia Active Problems:   Chronic pain syndrome   Pain of both hip joints   Microcytic anemia   Facial cellulitis   Polysubstance dependence (HCC)   IV drug user   Hepatitis C infection   Dental infection   Lower back pain   Abdominal wall cellulitis   Thrombophlebitis arm   Abdominal wall abscess   MSSA septicemia, present on admission Patient presenting with progressive right facial pain/swelling.  Was found to have an elevated WBC count of 12.9.  Etiology likely secondary to spontaneous rupture right lower quadrant abdominal wall abscess.  Underwent TEE on 07/31/2020, difficult study but no clear evidence of endocarditis. --Infectious disease following, appreciate assistance --Continue antibiotics with  clindamycin and cefazolin per ID; ID considering 1 dose of oritavancin with p.o. clindamycin --Supportive care  Right facial cellulitis Dental caries Patient with advanced dental disease and multiple caries associated with facial cellulitis.  CT with periapical radiolucency and potential subperiosteal abscess related to tooth #6 but not drainable at this time per OMFS.  Unlikely bacterial related to dental origin and would recommend continue current antibiotic regimen and follow-up outpatient for extraction of remaining nonrestorable dentition per Dr. Ross Marcus.  Left upper extremity nodules secondary to thrombophlebitis Patient with 2 small left upper extremity nodules that are erythematous and tender to palpation.  Ultrasound soft tissue on 07/29/2020 with ill-defined complex fluid collection 2.3 x 1.5 x 1.4 cm without drainable fluid collection or foreign body identified. --Continue to monitor areas closely, currently stable without increased size  Essential hypertension BP 128/97 this morning, well controlled. --Continue amlodipine 5 mg p.o. daily --Hydralazine 5 mg IV every 4 hours as needed SBP greater than 170  Hx IV drug abuse, relapse UDS on admission positive for opiates/cocaine.  Counseled on need for complete cessation.  Anxiety:  --Continue fluoxetine 20 mg p.o. daily --Hydroxyzine 100 mg p.o. 3 times daily  Neuropathy Chronic pain syndrome --Gabapentin 900 mg 0700 and 1300 --Gabapentin 1200 mg nightly --We will need outpatient referral to pain management  Morbid obesity Body mass index is 51.36 kg/m.  Counseled on need for aggressive lifestyle changes/weight loss as this complicates all facets of care.  Tobacco use disorder Counseled on need for complete cessation.   DVT prophylaxis: SCDs Code Status: Full code Family Communication: Updated patient extensively at bedside  Disposition Plan:  Status is: Inpatient  Remains  inpatient appropriate because:Ongoing  diagnostic testing needed not appropriate for outpatient work up, Unsafe d/c plan, IV treatments appropriate due to intensity of illness or inability to take PO and Inpatient level of care appropriate due to severity of illness ID considering 1 dose of oritavancin and p.o. clindamycin given in the ED for only short course of antibiotics with no endocarditis on TEE, but would like to observe for at least a few days to ensure no transformation of his significant left upper extremity thrombophlebitis.   Dispo: The patient is from: Home              Anticipated d/c is to: Home              Anticipated d/c date is: 3 days              Patient currently is not medically stable to d/c.   Consultants:   Orthopedics, Dr. Amanda Pea  Infectious disease  OMFS, Dr. Ross Marcus  Procedures:   TTE  TEE   Antimicrobials:   Vancomycin  9/9 - 9/13  Clindamycin 9/9, 9/11>>  Cefazolin 9/13>>   Subjective: Patient seen and examined bedside, resting comfortably.  Just returned back from TEE, no obvious signs of endocarditis, although difficult study per cardiology.  Continues on IV antibiotics with cefazolin and clindamycin.  Infectious disease considering giving 1 dose of oritavancin with p.o. clindamycin since only needs a short course of antibiotics with no evidence of endocarditis on TEE. Denies headache, no chest pain, no palpitations, no shortness of breath, no fever/chills/night sweats, no nausea/vomiting/diarrhea, no weakness, no fatigue, no paresthesias.  No acute events overnight per nursing staff.  Objective: Vitals:   07/31/20 0824 07/31/20 0834 07/31/20 0844 07/31/20 0903  BP: 130/82 (!) 142/97 (!) 146/95 (!) 140/95  Pulse: 87 78 74 72  Resp: 14 11 11 16   Temp: (!) 97.1 F (36.2 C)   97.7 F (36.5 C)  TempSrc: Temporal   Oral  SpO2: 96% 95% 96% 98%  Weight:      Height:        Intake/Output Summary (Last 24 hours) at 07/31/2020 1221 Last data filed at 07/31/2020 1100 Gross per 24  hour  Intake 1055.66 ml  Output 1000 ml  Net 55.66 ml   Filed Weights   07/24/20 1811  Weight: (!) 144.3 kg    Examination:  General exam: Appears calm and comfortable, appears older than stated age, poor dentition Respiratory system: Clear to auscultation. Respiratory effort normal.  Oxygenating well on room air Cardiovascular system: S1 & S2 heard, RRR. No JVD, murmurs, rubs, gallops or clicks. No pedal edema. Gastrointestinal system: Abdomen is nondistended, soft and nontender. No organomegaly or masses felt. Normal bowel sounds heard. Central nervous system: Alert and oriented. No focal neurological deficits. Extremities: Symmetric 5 x 5 power. Skin: two left anterior upper extremity nodule with surrounding erythema which are slightly painful to palpation and stable in size, right lower quadrant cellulitis continues to improve with decreased drainage Psychiatry: Judgement and insight appear poor. Mood & affect appropriate.     Data Reviewed: I have personally reviewed following labs and imaging studies  CBC: Recent Labs  Lab 07/24/20 1836 07/25/20 1403 07/29/20 0538 07/30/20 0436  WBC 12.9* 9.8 6.4 6.0  NEUTROABS 10.0* 6.6  --   --   HGB 12.3* 11.0* 11.0* 10.9*  HCT 38.8* 34.9* 35.2* 35.3*  MCV 76.4* 77.6* 77.9* 76.4*  PLT 465* 406* 363 342   Basic Metabolic Panel: Recent Labs  Lab 07/24/20 1836 07/25/20 1403 07/29/20 0538 07/30/20 0436  NA 135 136 137 137  K 3.5 3.9 4.3 4.0  CL 100 101 103 103  CO2 GLUCOSE 137* 101* 94 95  BUN CREATININE 1.10 0.96 0.94 0.91  CALCIUM 9.4 9.2 9.3 9.2   GFR: Estimated Creatinine Clearance: 145.1 mL/min (by C-G formula based on SCr of 0.91 mg/dL). Liver Function Tests: Recent Labs  Lab 07/24/20 1836 07/25/20 1403  AST 20 16  ALT 16 14  ALKPHOS 112 109  BILITOT 0.3 0.8  PROT 9.5* 8.1  ALBUMIN 4.2 3.5   No results for input(s): LIPASE, AMYLASE in the last 168 hours. No results for input(s):  AMMONIA in the last 168 hours. Coagulation Profile: No results for input(s): INR, PROTIME in the last 168 hours. Cardiac Enzymes: No results for input(s): CKTOTAL, CKMB, CKMBINDEX, TROPONINI in the last 168 hours. BNP (last 3 results) No results for input(s): PROBNP in the last 8760 hours. HbA1C: No results for input(s): HGBA1C in the last 72 hours. CBG: No results for input(s): GLUCAP in the last 168 hours. Lipid Profile: No results for input(s): CHOL, HDL, LDLCALC, TRIG, CHOLHDL, LDLDIRECT in the last 72 hours. Thyroid Function Tests: No results for input(s): TSH, T4TOTAL, FREET4, T3FREE, THYROIDAB in the last 72 hours. Anemia Panel: No results for input(s): VITAMINB12, FOLATE, FERRITIN, TIBC, IRON, RETICCTPCT in the last 72 hours. Sepsis Labs: Recent Labs  Lab 07/24/20 1836  LATICACIDVEN 1.4    Recent Results (from the past 240 hour(s))  Culture, blood (routine x 2)     Status: Abnormal   Collection Time: 07/24/20  6:37 PM   Specimen: BLOOD RIGHT ARM  Result Value Ref Range Status   Specimen Description   Final    BLOOD RIGHT ARM Performed at [facility], [address] [place], [place] [address]    Special Requests   Final    BOTTLES DRAWN AEROBIC AND ANAEROBIC Blood Culture adequate volume Performed at [facility], [address] [place], [place] [address]    Culture  Setup Time   Final    GRAM POSITIVE COCCI IN CLUSTERS ANAEROBIC BOTTLE ONLY CRITICAL RESULT CALLED TO, READ BACK BY AND VERIFIED WITHCandiss Norse [facility] 2051 07/25/20 A BROWNING Performed at [facility], [address], [place], [place] [address]    Culture STAPHYLOCOCCUS AUREUS (A)  Final   Report Status 07/27/2020 FINAL  Final   Organism ID, Bacteria STAPHYLOCOCCUS AUREUS  Final      Susceptibility   Staphylococcus aureus - MIC*    CIPROFLOXACIN <=0.5 SENSITIVE Sensitive     ERYTHROMYCIN <=0.25 SENSITIVE Sensitive     GENTAMICIN <=0.5 SENSITIVE Sensitive      OXACILLIN 0.5 SENSITIVE Sensitive     TETRACYCLINE <=1 SENSITIVE Sensitive     VANCOMYCIN 1 SENSITIVE Sensitive     TRIMETH/SULFA <=10 SENSITIVE Sensitive     CLINDAMYCIN <=0.25 SENSITIVE Sensitive     RIFAMPIN <=0.5 SENSITIVE Sensitive     Inducible Clindamycin NEGATIVE Sensitive     * STAPHYLOCOCCUS AUREUS  Blood Culture ID Panel (Reflexed)     Status: Abnormal   Collection Time: 07/24/20  6:37 PM  Result Value Ref Range Status   Enterococcus faecalis NOT DETECTED NOT DETECTED Final   Enterococcus Faecium NOT DETECTED NOT DETECTED Final   Listeria monocytogenes NOT DETECTED NOT DETECTED Final   Staphylococcus species DETECTED (A) NOT DETECTED  Final    Comment: CRITICAL RESULT CALLED TO, READ BACK BY AND VERIFIED WITH: [facility] 2051 07/25/20 A BROWNING    Staphylococcus aureus (BCID) DETECTED (A) NOT DETECTED Final    Comment: CRITICAL RESULT CALLED TO, READ BACK BY AND VERIFIED WITH: Candiss NorseH VON [facility] Munras AveDOHLEN PHARMD 2051 07/25/20 A BROWNING    Staphylococcus epidermidis NOT DETECTED NOT DETECTED Final   Staphylococcus lugdunensis NOT DETECTED NOT DETECTED Final   Streptococcus species NOT DETECTED NOT DETECTED Final   Streptococcus agalactiae NOT DETECTED NOT DETECTED Final   Streptococcus pneumoniae NOT DETECTED NOT DETECTED Final   Streptococcus pyogenes NOT DETECTED NOT DETECTED Final   A.calcoaceticus-baumannii NOT DETECTED NOT DETECTED Final   Bacteroides fragilis NOT DETECTED NOT DETECTED Final   Enterobacterales NOT DETECTED NOT DETECTED Final   Enterobacter cloacae complex NOT DETECTED NOT DETECTED Final   Escherichia coli NOT DETECTED NOT DETECTED Final   Klebsiella aerogenes NOT DETECTED NOT DETECTED Final   Klebsiella oxytoca NOT DETECTED NOT DETECTED Final   Klebsiella pneumoniae NOT DETECTED NOT DETECTED Final   Proteus species NOT DETECTED NOT DETECTED Final   Salmonella species NOT DETECTED NOT DETECTED Final   Serratia marcescens NOT DETECTED NOT DETECTED Final    Haemophilus influenzae NOT DETECTED NOT DETECTED Final   Neisseria meningitidis NOT DETECTED NOT DETECTED Final   Pseudomonas aeruginosa NOT DETECTED NOT DETECTED Final   Stenotrophomonas maltophilia NOT DETECTED NOT DETECTED Final   Candida albicans NOT DETECTED NOT DETECTED Final   Candida auris NOT DETECTED NOT DETECTED Final   Candida glabrata NOT DETECTED NOT DETECTED Final   Candida krusei NOT DETECTED NOT DETECTED Final   Candida parapsilosis NOT DETECTED NOT DETECTED Final   Candida tropicalis NOT DETECTED NOT DETECTED Final   Cryptococcus neoformans/gattii NOT DETECTED NOT DETECTED Final   Meth resistant mecA/C and MREJ NOT DETECTED NOT DETECTED Final    Comment: Performed at [facility], [address] [place], [place] [address]  SARS Coronavirus 2 by RT PCR (hospital order, [facility]) Nasopharyngeal Peripheral     Status: None   Collection Time: 07/24/20  7:28 PM   Specimen: Peripheral; Nasopharyngeal  Result Value Ref Range Status   SARS Coronavirus 2 NEGATIVE NEGATIVE Final    Comment: (NOTE) SARS-CoV-2 target nucleic acids are NOT DETECTED.  The SARS-CoV-2 RNA is generally detectable in upper and lower respiratory specimens during the acute phase of infection. The lowest concentration of SARS-CoV-2 viral copies this assay can detect is 250 copies / mL. A negative result does not preclude SARS-CoV-2 infection and should not be used as the sole basis for treatment or other patient management decisions.  A negative result may occur with improper specimen collection / handling, submission of specimen other than nasopharyngeal swab, presence of viral mutation(s) within the areas targeted by this assay, and inadequate number of viral copies (<250 copies / mL). A negative result must be combined with clinical observations, patient history, and epidemiological information.  Fact Sheet for Patients:    BoilerBrush.com.cyhttps://www.fda.gov/media/136312/download  Fact Sheet for Healthcare Providers: https://pope.com/https://www.fda.gov/media/136313/download  This test is not yet approved or  cleared by the [place] FDA and has been authorized for detection and/or diagnosis of SARS-CoV-2 by FDA under an Emergency Use Authorization (EUA).  This EUA will remain in effect (meaning this test can be used) for the duration of the COVID-19 declaration under Section 564(b)(1) of the Act, 21 U.S.C. section 360bbb-3(b)(1), unless the authorization is terminated or revoked sooner.  Performed at [facility], [address] [place], [place] [address]   MRSA PCR Screening     Status: Abnormal   Collection Time: 07/25/20  4:21 AM   Specimen: Nasopharyngeal  Result Value Ref Range Status   MRSA by PCR POSITIVE (A) NEGATIVE Final    Comment:        The GeneXpert MRSA Assay (FDA approved for NASAL specimens only), is one component of a comprehensive MRSA colonization surveillance program. It is not intended to diagnose MRSA infection nor to guide or monitor treatment for MRSA infections. RESULT CALLED TO, READ BACK BY AND VERIFIED WITH: RN Jasmine December [facility] 6045 [phone] FCP Performed at [facility], [address], [place], [place] [address]   Culture, blood (Routine X 2) w Reflex to ID Panel     Status: None (Preliminary result)   Collection Time: 07/26/20  5:34 PM   Specimen: BLOOD LEFT ARM  Result Value Ref Range Status   Specimen Description BLOOD LEFT ARM  Final   Special Requests   Final    BOTTLES DRAWN AEROBIC AND ANAEROBIC Blood Culture adequate volume   Culture   Final    NO GROWTH 4 DAYS Performed at [facility], [address] [place], [place] [address]    Report Status PENDING  Incomplete  Culture, blood (Routine X 2) w Reflex to ID Panel     Status: Abnormal (Preliminary result)   Collection Time: 07/26/20  5:46 PM   Specimen: BLOOD LEFT HAND  Result Value Ref Range Status    Specimen Description BLOOD LEFT HAND  Final   Special Requests   Final    BOTTLES DRAWN AEROBIC ONLY Blood Culture adequate volume   Culture  Setup Time   Final    GRAM POSITIVE COCCI IN CLUSTERS GRAM POSITIVE COCCI IN CHAINS AEROBIC BOTTLE ONLY Organism ID to follow CRITICAL RESULT CALLED TO, READ BACK BY AND VERIFIED WITH: G. ABBOTT,PHARMD 0405 07/28/2020 Girtha Hake Performed at [facility], [address] [place], [place] [address]    Culture STAPHYLOCOCCUS CAPITIS (A)  Final   Report Status PENDING  Incomplete  Blood Culture ID Panel (Reflexed)     Status: Abnormal   Collection Time: 07/26/20  5:46 PM  Result Value Ref Range Status   Enterococcus faecalis NOT DETECTED NOT DETECTED Final   Enterococcus Faecium NOT DETECTED NOT DETECTED Final   Listeria monocytogenes NOT DETECTED NOT DETECTED Final   Staphylococcus species DETECTED (A) NOT DETECTED Final    Comment: CRITICAL RESULT CALLED TO, READ BACK BY AND VERIFIED WITH: G. ABBOTT,PHARMD 0405 07/28/2020 T. TYSOR    Staphylococcus aureus (BCID) NOT DETECTED NOT DETECTED Final   Staphylococcus epidermidis NOT DETECTED NOT DETECTED Final   Staphylococcus lugdunensis NOT DETECTED NOT DETECTED Final   Streptococcus species DETECTED (A) NOT DETECTED Final    Comment: Not Enterococcus species, Streptococcus agalactiae, Streptococcus pyogenes, or Streptococcus pneumoniae. CRITICAL RESULT CALLED TO, READ BACK BY AND VERIFIED WITH: G. ABBOTT,PHARMD 0405 07/28/2020 T. TYSOR    Streptococcus agalactiae NOT DETECTED NOT DETECTED Final   Streptococcus pneumoniae NOT DETECTED NOT DETECTED Final   Streptococcus pyogenes NOT DETECTED NOT DETECTED Final   A.calcoaceticus-baumannii NOT DETECTED NOT DETECTED Final   Bacteroides fragilis NOT DETECTED NOT DETECTED Final   Enterobacterales NOT DETECTED NOT DETECTED Final   Enterobacter cloacae complex NOT DETECTED NOT DETECTED Final   Escherichia coli NOT DETECTED NOT DETECTED Final    Klebsiella aerogenes NOT DETECTED  NOT DETECTED Final   Klebsiella oxytoca NOT DETECTED NOT DETECTED Final   Klebsiella pneumoniae NOT DETECTED NOT DETECTED Final   Proteus species NOT DETECTED NOT DETECTED Final   Salmonella species NOT DETECTED NOT DETECTED Final   Serratia marcescens NOT DETECTED NOT DETECTED Final   Haemophilus influenzae NOT DETECTED NOT DETECTED Final   Neisseria meningitidis NOT DETECTED NOT DETECTED Final   Pseudomonas aeruginosa NOT DETECTED NOT DETECTED Final   Stenotrophomonas maltophilia NOT DETECTED NOT DETECTED Final   Candida albicans NOT DETECTED NOT DETECTED Final   Candida auris NOT DETECTED NOT DETECTED Final   Candida glabrata NOT DETECTED NOT DETECTED Final   Candida krusei NOT DETECTED NOT DETECTED Final   Candida parapsilosis NOT DETECTED NOT DETECTED Final   Candida tropicalis NOT DETECTED NOT DETECTED Final   Cryptococcus neoformans/gattii NOT DETECTED NOT DETECTED Final    Comment: Performed at [facility], [address], [place], [place] [address]  Culture, blood (Routine X 2) w Reflex to ID Panel     Status: None (Preliminary result)   Collection Time: 07/27/20 11:10 AM   Specimen: BLOOD RIGHT ARM  Result Value Ref Range Status   Specimen Description BLOOD RIGHT ARM  Final   Special Requests   Final    BOTTLES DRAWN AEROBIC AND ANAEROBIC Blood Culture adequate volume   Culture   Final    NO GROWTH 3 DAYS Performed at [facility], [address] [place], [place] [address]    Report Status PENDING  Incomplete         Radiology Studies: No results found.      Scheduled Meds: . amLODipine  5 mg Oral Daily  . chlorhexidine  15 mL Mouth/Throat QID  . FLUoxetine  20 mg Oral Daily  . gabapentin  900 mg Oral q AM  . gabapentin  900 mg Oral Q24H  . gabapentin  1,200 mg Oral QHS  . hydrOXYzine  100 mg Oral TID  . LORazepam  1 mg Oral QHS   Continuous Infusions: . sodium chloride Stopped (07/27/20 1334)  .   ceFAZolin (ANCEF) IV Stopped (07/31/20 3825)  . clindamycin (CLEOCIN) IV Stopped (07/31/20 0538)     LOS: 6 days    Time spent: 37 minutes spent on chart review, discussion with nursing staff, consultants, updating family and interview/physical exam; more than 50% of that time was spent in counseling and/or coordination of care.    Alvira Philips [place], DO [facility] Available via Epic secure chat 7am-7pm After these hours, please refer to coverage provider listed on [email or web address] 07/31/2020, 12:21 PM

## 2020-07-31 NOTE — Progress Notes (Signed)
  Echocardiogram Echocardiogram Transesophageal has been performed.  Joshua Jacobs 07/31/2020, 8:49 AM

## 2020-07-31 NOTE — CV Procedure (Signed)
    TRANSESOPHAGEAL ECHOCARDIOGRAM   NAME:  Joshua Jacobs   MRN: [id number] DOB:  March 09, 1976   ADMIT DATE: 07/24/2020  INDICATIONS: Bacteremia  PROCEDURE:   Informed consent was obtained prior to the procedure. The risks, benefits and alternatives for the procedure were discussed and the patient comprehended these risks.  Risks include, but are not limited to, cough, sore throat, vomiting, nausea, somnolence, esophageal and stomach trauma or perforation, bleeding, low blood pressure, aspiration, pneumonia, infection, trauma to the teeth and death.    Procedural time out performed. He received 584 mg propofol and 3 mg versed under monitored anesthesia care under the supervision of Dr. Rosalene Billings  The transesophageal probe was inserted in the esophagus and stomach without difficulty and multiple views were obtained.    COMPLICATIONS:    There were no immediate complications.  FINDINGS: Technically challenging study. Views were approximately 60 degrees from normal windows, and he was very sensitive to movement of the probe. Images were adequate but not ideal.  LEFT VENTRICLE: EF = 60-65%. No regional wall motion abnormalities.  RIGHT VENTRICLE: Normal size and function.   LEFT ATRIUM: No thrombus/mass.  LEFT ATRIAL APPENDAGE: No thrombus/mass.   RIGHT ATRIUM: No thrombus/mass. Chiari network  AORTIC VALVE:  Trileaflet. No regurgitation. No vegetation.  MITRAL VALVE:    Normal structure. No regurgitation. No vegetation. There is some thickening in the subvalvular apparatus but no clear vegetation.  TRICUSPID VALVE: Normal structure. Trivial regurgitation. No vegetation. There is thickening of the leaflet tip and subvalvular apparatus, but no clear vegetation.  PULMONIC VALVE: Grossly normal structure. No clear vegetation, but difficult to image.  INTERATRIAL SEPTUM: No PFO or ASD seen by color Doppler.  PERICARDIUM: No effusion noted.  DESCENDING AORTA: No significant plaque  seen   CONCLUSION: Very difficult study. Even with high doses of anesthesia, he was somewhat intolerant of the procedure. No good transgastric views, and windows are off axis by about 60 degrees. There is some thickening in the chordal structures below the mitral and tricuspid valves but no clear endocarditis seen.  Jodelle Red, MD, PhD [facility]  [address], [address] [place], [place] [address] [phone]   8:16 AM

## 2020-07-31 NOTE — Interval H&P Note (Signed)
History and Physical Interval Note:  07/31/2020 7:33 AM  Joshua Jacobs  has presented today for surgery, with the diagnosis of bacteremia.  The various methods of treatment have been discussed with the patient and family. After consideration of risks, benefits and other options for treatment, the patient has consented to  Procedure(s): TRANSESOPHAGEAL ECHOCARDIOGRAM (TEE) (N/A) as a surgical intervention.  The patient's history has been reviewed, patient examined, no change in status, stable for surgery.  I have reviewed the patient's chart and labs.  Questions were answered to the patient's satisfaction.     Joshua Jacobs

## 2020-08-01 ENCOUNTER — Encounter (HOSPITAL_COMMUNITY): Payer: Self-pay | Admitting: Cardiology

## 2020-08-01 LAB — CULTURE, BLOOD (ROUTINE X 2)
Culture: NO GROWTH
Special Requests: ADEQUATE
Special Requests: ADEQUATE

## 2020-08-01 MED ORDER — PENICILLIN G IN SODIUM CHLORIDE 10,000 UNITS/ML VIAL FOR SKIN TEST (NO CHARGE)
1000.0000 [IU] | Freq: Once | INTRADERMAL | Status: AC
  Administered 2020-08-01: 1000 [IU] via INTRADERMAL
  Filled 2020-08-01: qty 1

## 2020-08-01 MED ORDER — SODIUM CHLORIDE 0.9% FLUSH
0.1000 mL | Freq: Once | INTRAVENOUS | Status: AC
  Administered 2020-08-01: 0.1 mL via TOPICAL

## 2020-08-01 MED ORDER — AMOXICILLIN 500 MG PO CAPS
500.0000 mg | ORAL_CAPSULE | Freq: Once | ORAL | Status: AC
  Administered 2020-08-01: 500 mg via ORAL
  Filled 2020-08-01: qty 1

## 2020-08-01 MED ORDER — SODIUM CHLORIDE 0.9% FLUSH
0.1000 mL | Freq: Once | INTRAVENOUS | Status: AC
  Administered 2020-08-01: 0.1 mL via INTRADERMAL

## 2020-08-01 MED ORDER — CLINDAMYCIN HCL 300 MG PO CAPS
450.0000 mg | ORAL_CAPSULE | Freq: Three times a day (TID) | ORAL | Status: DC
  Administered 2020-08-02 (×2): 450 mg via ORAL
  Filled 2020-08-01 (×3): qty 1

## 2020-08-01 MED ORDER — BENZYLPENICILLOYL POLYLYSINE 0.25 ML ID SOLN
0.0600 mL | Freq: Once | INTRADERMAL | Status: AC
  Administered 2020-08-01: 0.06 mL via TOPICAL
  Filled 2020-08-01: qty 0.25

## 2020-08-01 MED ORDER — PENICILLIN G IN SODIUM CHLORIDE 10,000 UNITS/ML VIAL FOR SKIN TEST
1000.0000 [IU] | Freq: Once | INTRADERMAL | Status: AC
  Administered 2020-08-01: 1000 [IU] via TOPICAL
  Filled 2020-08-01: qty 1

## 2020-08-01 MED ORDER — BENZYLPENICILLOYL POLYLYSINE 0.25 ML ID SOLN (NO-CHARGE)
0.1000 mL | Freq: Once | INTRADERMAL | Status: AC
  Administered 2020-08-01: 0.1 mL via INTRADERMAL
  Filled 2020-08-01: qty 0.25

## 2020-08-01 MED ORDER — DIPHENHYDRAMINE HCL 50 MG/ML IJ SOLN: 25.0000 mg | Freq: Once | INTRAMUSCULAR | Status: DC | PRN

## 2020-08-01 MED ORDER — HISTAMINE PHOSPHATE 2.75 MG/ML IJ SOLN
0.1000 mL | Freq: Once | INTRAMUSCULAR | Status: AC
  Administered 2020-08-01: 0.1 mL via TOPICAL
  Filled 2020-08-01: qty 5

## 2020-08-01 MED ORDER — EPINEPHRINE 0.3 MG/0.3ML IJ SOAJ
0.3000 mg | Freq: Once | INTRAMUSCULAR | Status: DC | PRN
  Filled 2020-08-01: qty 0.6

## 2020-08-01 MED ORDER — PANTOPRAZOLE SODIUM 40 MG PO TBEC
40.0000 mg | DELAYED_RELEASE_TABLET | Freq: Every day | ORAL | Status: DC
  Administered 2020-08-01 – 2020-08-02 (×2): 40 mg via ORAL
  Filled 2020-08-01 (×2): qty 1

## 2020-08-01 MED ORDER — ORITAVANCIN DIPHOSPHATE 400 MG IV SOLR
1200.0000 mg | Freq: Once | INTRAVENOUS | Status: AC
  Administered 2020-08-01: 1200 mg via INTRAVENOUS
  Filled 2020-08-01: qty 120

## 2020-08-01 NOTE — Progress Notes (Signed)
Penicillin Skin Testing Assessment  Penicillin allergy skin testing completed on 08/01/20  Penicillin allergy skin testing requested by:Dr. Orvan Falconer   Patient history of penicillin or beta lactam allergy: Joshua Jacobs experienced anaphylaxis to penicillin when he was 44 years old. He required medical attention and was hospitalized for several days.     Test Date Product Scratch Width (mm) Intradermal #1 Width (mm) Intradermal #2 Width (mm) Results (Pos/Neg/Ambiguous)  9/17 PRE-PEN (undiluted) 2 mm 0 mm 0 mm Neg   9/17 Penicillin G (10,000 U/mL) 1 mm 0 mm 0 mm Neg   9/17 Dilutent Control 1 mm 0 mm   Neg   9/17  Histamine (1mg /mL) 5 mm      Neg     Criteria for positive prick skin test: Induration > 49mm greater than diluent control Criteria for positive intradermal skin test: Significant increase in size of original bleb with wheal diameter 7mm or more larger than diluent control; itching and flare are commonly present Criteria for negative intradermal skin test: No increase in size of original bleb and no reaction greater than control site Equivocal intradermal skin test: Wheal only slightly larger than initial injection bleb and control site, with or without erythematous flare OR duplicates are discordant. Control site: If wheal >2-3 mm after 20 min, repeat skin test to look for dermatographism     Interpretation: [x]  NEGATIVE for penicillin allergy []  POSITIVE for penicillin allergy        1m, PharmD, BCPS, BCIDP Infectious Diseases Clinical Pharmacist Phone: [phone] 08/01/2020 4:50 PM

## 2020-08-01 NOTE — Progress Notes (Addendum)
[facility]  Date of Admission:  07/24/2020   Total days of antibiotics 8       Day 7 Clindamycin       Day 1 Oritavancin        ASSESSMENT: Mr. Lobue has MSSA bacteremia secondary to abdominal wall and recent soft tissue abscesses, andleft facial cellulitis secondary to dental infection.  TEE was negative for vegetation so patient will receive a 1 dose of oritavancin for his 2 weeks course of therapy.  Skin test was performed today and confirmed negative. Patient is currently on the oral challenge phase and if no adverse reaction observed, change Clindamycin to oral Augmentin through 08/06/2020. Thrombophlebitis of left and right arm appears stable and smaller in size.  Patient will need follow-up with PCP and pain management upon discharge to prevent IV drugs relapse.  ID will sign off.  Thank you for allowing [place] to be part of the care team.  Please contact us for any questions or concerns.  PLAN: -Oritavancin given  -If pass oral challenge, change Clindamycin to oral Augmentin through 08/06/2020. --If does not pass, continue oral Clindamycin through 08/06/2020   Principal Problem:   MSSA bacteremia Active Problems:   Facial cellulitis   Dental infection   Thrombophlebitis arm   Abdominal wall abscess   IV drug user   Chronic pain syndrome   Pain of both hip joints   Microcytic anemia   Polysubstance dependence (HCC)   Lower back pain   Scheduled Meds: . amLODipine  5 mg Oral Daily  . chlorhexidine  15 mL Mouth/Throat QID  . clindamycin  450 mg Oral Q8H  . FLUoxetine  20 mg Oral Daily  . gabapentin  900 mg Oral q AM  . gabapentin  900 mg Oral Q24H  . gabapentin  1,200 mg Oral QHS  . hydrOXYzine  100 mg Oral TID  . LORazepam  1 mg Oral QHS  . pantoprazole  40 mg Oral Daily   Continuous Infusions: . sodium chloride Stopped (07/27/20 1334)  . oritavancin (ORBACTIV) IVPB     PRN Meds:.sodium chloride, acetaminophen, alum & mag  hydroxide-simeth, diclofenac Sodium, hydrALAZINE, hydrOXYzine, menthol-cetylpyridinium, morphine injection, tiZANidine   SUBJECTIVE: Patient is seen at bedside today.  He appears in no acute distress.  Patient states that his thrombophlebitis bilaterally has improved with less pain.  He still complaining of back pain and hip pain.  Plan to do a penicillin allergy skin test today to determine outpatient course of antibiotics.  Patient is determined to get better and avoid IV drugs.  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  Patient will stay with his parents after discharge.  Will need follow-up with PCP and pain management upon discharge to prevent relapse.  Review of Systems: Review of Systems  Constitutional: Negative for chills and fever.  HENT: Negative.   Eyes: Negative.   Respiratory: Negative.   Cardiovascular: Negative.   Musculoskeletal: Positive for back pain and joint pain.  Skin: Positive for rash (Stable thrombophlebitis bilaterally).    Allergies  Allergen Reactions  . Penicillins Anaphylaxis    Has patient had a PCN reaction causing immediate rash, facial/tongue/throat swelling, SOB or lightheadedness with hypotension: Yes Has patient had a PCN reaction causing severe rash involving mucus membranes or skin necrosis: No Has patient had a PCN reaction that  required hospitalization Yes Has patient had a PCN reaction occurring within the last 10 years: No If all of the above answers are "NO", then may proceed with Cephalosporin  Childhood PCN > described as chest closing/tightness.   Marland Kitchen Penicillins Anaphylaxis    Has patient had a PCN reaction causing immediate rash, facial/tongue/throat swelling, SOB or lightheadedness with hypotension: No Has patient had a PCN reaction causing severe rash involving mucus membranes or skin necrosis: No Has patient had a PCN reaction that  required hospitalization: Yes Has patient had a PCN reaction occurring within the last 10 years: No If all of the above answers are "NO", then may proceed with Cephalosporin use.    OBJECTIVE: Vitals:   07/31/20 1532 07/31/20 1900 08/01/20 0500 08/01/20 0824  BP: 128/80 132/77 137/72 (!) 155/97  Pulse: 89 90 80 67  Resp: Temp: 98.7 F (37.1 C) 98.8 F (37.1 C) 98.7 F (37.1 C) 98.1 F (36.7 C)  TempSrc: Oral Oral Oral Oral  SpO2: 99% 100% 99% 98%  Weight:      Height:       Body mass index is 51.36 kg/m.  Physical Exam Constitutional:      General: He is not in acute distress.    Appearance: He is obese.  HENT:     Head: Normocephalic.  Eyes:     General: No scleral icterus.       Right eye: No discharge.        Left eye: No discharge.  Cardiovascular:     Rate and Rhythm: Normal rate and regular rhythm.     Heart sounds: No murmur heard.   Pulmonary:     Breath sounds: Normal breath sounds. No wheezing.  Abdominal:     General: Bowel sounds are normal.  Musculoskeletal:     Cervical back: Normal range of motion.     Right lower leg: No edema.     Left lower leg: No edema.  Skin:    Comments: Stable thrombophlebitis of left antecubital fossa and right wrist.  Appears smaller and less painful. Abdominal wall cellulitis, nonpurulent, less erythematous.  Neurological:     Mental Status: He is alert.  Psychiatric:        Mood and Affect: Mood normal.     Lab Results Lab Results  Component Value Date   WBC 6.0 07/30/2020   HGB 10.9 (L) 07/30/2020   HCT 35.3 (L) 07/30/2020   MCV 76.4 (L) 07/30/2020   PLT 342 07/30/2020    Lab Results  Component Value Date   CREATININE 0.91 07/30/2020   BUN 17 07/30/2020   NA 137 07/30/2020   K 4.0 07/30/2020   CL 103 07/30/2020   CO2 25 07/30/2020    Lab Results  Component Value Date   ALT 14 07/25/2020   AST 16 07/25/2020   ALKPHOS 109 07/25/2020   BILITOT 0.8 07/25/2020      Microbiology: Recent Results (from the past 240 hour(s))  Culture, blood (routine x 2)     Status: Abnormal   Collection Time: 07/24/20  6:37 PM   Specimen: BLOOD RIGHT ARM  Result Value Ref Range Status   Specimen Description   Final    BLOOD RIGHT ARM Performed at [facility], [address] [place], [place] [address]    Special Requests   Final    BOTTLES DRAWN AEROBIC AND ANAEROBIC Blood Culture adequate volume Performed at [facility], [address] [place], [place] [address]    Culture  Setup Time   Final    GRAM POSITIVE COCCI IN CLUSTERS ANAEROBIC BOTTLE ONLY CRITICAL RESULT CALLED TO, READ BACK BY AND VERIFIED WITHCandiss Norse [facility] 2051 07/25/20 A BROWNING Performed at [facility], [address] [place], [place] [address]    Culture STAPHYLOCOCCUS AUREUS (A)  Final   Report Status 07/27/2020 FINAL  Final   Organism ID, Bacteria STAPHYLOCOCCUS AUREUS  Final      Susceptibility   Staphylococcus aureus - MIC*    CIPROFLOXACIN <=0.5 SENSITIVE Sensitive     ERYTHROMYCIN <=0.25 SENSITIVE Sensitive     GENTAMICIN <=0.5 SENSITIVE Sensitive     OXACILLIN 0.5 SENSITIVE Sensitive     TETRACYCLINE <=1 SENSITIVE Sensitive     VANCOMYCIN 1 SENSITIVE Sensitive     TRIMETH/SULFA <=10 SENSITIVE Sensitive     CLINDAMYCIN <=0.25 SENSITIVE Sensitive     RIFAMPIN <=0.5 SENSITIVE Sensitive     Inducible Clindamycin NEGATIVE Sensitive     * STAPHYLOCOCCUS AUREUS  Blood Culture ID Panel (Reflexed)     Status: Abnormal   Collection Time: 07/24/20  6:37 PM  Result Value Ref Range Status   Enterococcus faecalis NOT DETECTED NOT DETECTED Final   Enterococcus Faecium NOT DETECTED NOT DETECTED Final   Listeria monocytogenes NOT DETECTED NOT DETECTED Final   Staphylococcus species DETECTED (A) NOT DETECTED Final    Comment: CRITICAL RESULT CALLED TO, READ BACK BY AND VERIFIED WITH: H VON [facility] PHARMD 2051 07/25/20 A BROWNING    Staphylococcus aureus  (BCID) DETECTED (A) NOT DETECTED Final    Comment: CRITICAL RESULT CALLED TO, READ BACK BY AND VERIFIED WITH: H VON [facility] PHARMD 2051 07/25/20 A BROWNING    Staphylococcus epidermidis NOT DETECTED NOT DETECTED Final   Staphylococcus lugdunensis NOT DETECTED NOT DETECTED Final   Streptococcus species NOT DETECTED NOT DETECTED Final   Streptococcus agalactiae NOT DETECTED NOT DETECTED Final   Streptococcus pneumoniae NOT DETECTED NOT DETECTED Final   Streptococcus pyogenes NOT DETECTED NOT DETECTED Final   A.calcoaceticus-baumannii NOT DETECTED NOT DETECTED Final   Bacteroides fragilis NOT DETECTED NOT DETECTED Final   Enterobacterales NOT DETECTED NOT DETECTED Final   Enterobacter cloacae complex NOT DETECTED NOT DETECTED Final   Escherichia coli NOT DETECTED NOT DETECTED Final   Klebsiella aerogenes NOT DETECTED NOT DETECTED Final   Klebsiella oxytoca NOT DETECTED NOT DETECTED Final   Klebsiella pneumoniae NOT DETECTED NOT DETECTED Final   Proteus species NOT DETECTED NOT DETECTED Final   Salmonella species NOT DETECTED NOT DETECTED Final   Serratia marcescens NOT DETECTED NOT DETECTED Final   Haemophilus influenzae NOT DETECTED NOT DETECTED Final   Neisseria meningitidis NOT DETECTED NOT DETECTED Final   Pseudomonas aeruginosa NOT DETECTED NOT DETECTED Final   Stenotrophomonas maltophilia NOT DETECTED NOT DETECTED Final   Candida albicans NOT DETECTED NOT DETECTED Final   Candida auris NOT DETECTED NOT DETECTED Final   Candida glabrata NOT DETECTED NOT DETECTED Final   Candida krusei NOT DETECTED NOT DETECTED Final   Candida parapsilosis NOT DETECTED NOT DETECTED Final   Candida tropicalis NOT DETECTED NOT DETECTED Final   Cryptococcus neoformans/gattii NOT DETECTED NOT DETECTED Final   Meth resistant mecA/C and MREJ NOT DETECTED NOT DETECTED Final    Comment: Performed at [facility], [address] [place], [place] [address]  SARS Coronavirus 2 by RT PCR (hospital order,  performed in [facility] hospital lab) Nasopharyngeal Peripheral  Status: None   Collection Time: 07/24/20  7:28 PM   Specimen: Peripheral; Nasopharyngeal  Result Value Ref Range Status   SARS Coronavirus 2 NEGATIVE NEGATIVE Final    Comment: (NOTE) SARS-CoV-2 target nucleic acids are NOT DETECTED.  The SARS-CoV-2 RNA is generally detectable in upper and lower respiratory specimens during the acute phase of infection. The lowest concentration of SARS-CoV-2 viral copies this assay can detect is 250 copies / mL. A negative result does not preclude SARS-CoV-2 infection and should not be used as the sole basis for treatment or other patient management decisions.  A negative result may occur with improper specimen collection / handling, submission of specimen other than nasopharyngeal swab, presence of viral mutation(s) within the areas targeted by this assay, and inadequate number of viral copies (<250 copies / mL). A negative result must be combined with clinical observations, patient history, and epidemiological information.  Fact Sheet for Patients:   BoilerBrush.com.cy  Fact Sheet for Healthcare Providers: https://pope.com/  This test is not yet approved or  cleared by the [place] FDA and has been authorized for detection and/or diagnosis of SARS-CoV-2 by FDA under an Emergency Use Authorization (EUA).  This EUA will remain in effect (meaning this test can be used) for the duration of the COVID-19 declaration under Section 564(b)(1) of the Act, 21 U.S.C. section 360bbb-3(b)(1), unless the authorization is terminated or revoked sooner.  Performed at [facility], [address] [place], [place] [address]   MRSA PCR Screening     Status: Abnormal   Collection Time: 07/25/20  4:21 AM   Specimen: Nasopharyngeal  Result Value Ref Range Status   MRSA by PCR POSITIVE (A) NEGATIVE Final    Comment:        The  GeneXpert MRSA Assay (FDA approved for NASAL specimens only), is one component of a comprehensive MRSA colonization surveillance program. It is not intended to diagnose MRSA infection nor to guide or monitor treatment for MRSA infections. RESULT CALLED TO, READ BACK BY AND VERIFIED WITH: RN Jasmine December [facility] [phone] FCP Performed at [facility], [address] [place], [place] [address]   Culture, blood (Routine X 2) w Reflex to ID Panel     Status: None   Collection Time: 07/26/20  5:34 PM   Specimen: BLOOD LEFT ARM  Result Value Ref Range Status   Specimen Description BLOOD LEFT ARM  Final   Special Requests   Final    BOTTLES DRAWN AEROBIC AND ANAEROBIC Blood Culture adequate volume   Culture   Final    NO GROWTH 5 DAYS Performed at [facility], [address], [place], [place] [address]    Report Status 07/31/2020 FINAL  Final  Culture, blood (Routine X 2) w Reflex to ID Panel     Status: Abnormal (Preliminary result)   Collection Time: 07/26/20  5:46 PM   Specimen: BLOOD LEFT HAND  Result Value Ref Range Status   Specimen Description BLOOD LEFT HAND  Final   Special Requests   Final    BOTTLES DRAWN AEROBIC ONLY Blood Culture adequate volume   Culture  Setup Time   Final    GRAM POSITIVE COCCI IN CLUSTERS GRAM POSITIVE COCCI IN CHAINS AEROBIC BOTTLE ONLY Organism ID to follow CRITICAL RESULT CALLED TO, READ BACK BY AND VERIFIED WITH: G. ABBOTT,PHARMD 0405 07/28/2020 Girtha Hake Performed at [facility], [address], [place], [place] [address]    Culture STAPHYLOCOCCUS  CAPITIS (A)  Final   Report Status PENDING  Incomplete  Blood Culture ID Panel (Reflexed)     Status: Abnormal   Collection Time: 07/26/20  5:46 PM  Result Value Ref Range Status   Enterococcus faecalis NOT DETECTED NOT DETECTED Final   Enterococcus Faecium NOT DETECTED NOT DETECTED Final   Listeria monocytogenes NOT DETECTED NOT DETECTED Final   Staphylococcus species  DETECTED (A) NOT DETECTED Final    Comment: CRITICAL RESULT CALLED TO, READ BACK BY AND VERIFIED WITH: G. ABBOTT,PHARMD 0405 07/28/2020 T. TYSOR    Staphylococcus aureus (BCID) NOT DETECTED NOT DETECTED Final   Staphylococcus epidermidis NOT DETECTED NOT DETECTED Final   Staphylococcus lugdunensis NOT DETECTED NOT DETECTED Final   Streptococcus species DETECTED (A) NOT DETECTED Final    Comment: Not Enterococcus species, Streptococcus agalactiae, Streptococcus pyogenes, or Streptococcus pneumoniae. CRITICAL RESULT CALLED TO, READ BACK BY AND VERIFIED WITH: G. ABBOTT,PHARMD 0405 07/28/2020 T. TYSOR    Streptococcus agalactiae NOT DETECTED NOT DETECTED Final   Streptococcus pneumoniae NOT DETECTED NOT DETECTED Final   Streptococcus pyogenes NOT DETECTED NOT DETECTED Final   A.calcoaceticus-baumannii NOT DETECTED NOT DETECTED Final   Bacteroides fragilis NOT DETECTED NOT DETECTED Final   Enterobacterales NOT DETECTED NOT DETECTED Final   Enterobacter cloacae complex NOT DETECTED NOT DETECTED Final   Escherichia coli NOT DETECTED NOT DETECTED Final   Klebsiella aerogenes NOT DETECTED NOT DETECTED Final   Klebsiella oxytoca NOT DETECTED NOT DETECTED Final   Klebsiella pneumoniae NOT DETECTED NOT DETECTED Final   Proteus species NOT DETECTED NOT DETECTED Final   Salmonella species NOT DETECTED NOT DETECTED Final   Serratia marcescens NOT DETECTED NOT DETECTED Final   Haemophilus influenzae NOT DETECTED NOT DETECTED Final   Neisseria meningitidis NOT DETECTED NOT DETECTED Final   Pseudomonas aeruginosa NOT DETECTED NOT DETECTED Final   Stenotrophomonas maltophilia NOT DETECTED NOT DETECTED Final   Candida albicans NOT DETECTED NOT DETECTED Final   Candida auris NOT DETECTED NOT DETECTED Final   Candida glabrata NOT DETECTED NOT DETECTED Final   Candida krusei NOT DETECTED NOT DETECTED Final   Candida parapsilosis NOT DETECTED NOT DETECTED Final   Candida tropicalis NOT DETECTED NOT  DETECTED Final   Cryptococcus neoformans/gattii NOT DETECTED NOT DETECTED Final    Comment: Performed at [facility], [address] [place], [place] [address]  Culture, blood (Routine X 2) w Reflex to ID Panel     Status: None   Collection Time: 07/27/20 11:10 AM   Specimen: BLOOD RIGHT ARM  Result Value Ref Range Status   Specimen Description BLOOD RIGHT ARM  Final   Special Requests   Final    BOTTLES DRAWN AEROBIC AND ANAEROBIC Blood Culture adequate volume   Culture   Final    NO GROWTH 5 DAYS Performed at [facility], [address] [place], [place] [address]    Report Status 08/01/2020 FINAL  Final    Doran StablerQuan  Marisel Tostenson, DO [facility] [phone] pager    08/01/2020, 10:30 AM

## 2020-08-01 NOTE — Progress Notes (Signed)
Penicillin skin test 2

## 2020-08-01 NOTE — Progress Notes (Signed)
Skin test part 1

## 2020-08-01 NOTE — Progress Notes (Signed)
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  Reports pain caused him to relapse with his IV drug condition.  Also notable wound on his abdomen 3 days prior to urgent care presentation.  In the ED at [facility], CT maxillofacial revealed right facial cellulitis, probably odontogenic with infection source suspected to be carious right maxillary, subperiosteal abscess suspected near the root of the tooth.  CT abdomen and pelvis showing skin thickening with ill-defined edema involving the right lower anterior abdominal wall consistent with cellulitis.  Temperature 99.4, BP 180/107, WBC count 12.9 with neutrophilia. TRH asked to admit for cellulitis.  Started on IV antibiotics empirically in the ED, IV vancomycin.  Assessment & Plan:   Principal Problem:   MSSA bacteremia Active Problems:   Chronic pain syndrome   Pain of both hip joints   Microcytic anemia   Facial cellulitis   Polysubstance dependence (HCC)   IV drug user   Dental infection   Lower back pain   Thrombophlebitis arm   Abdominal wall abscess   MSSA septicemia, present on admission Patient presenting with progressive right facial pain/swelling.  Was found to have an elevated WBC count of 12.9.  Etiology likely secondary to spontaneous rupture right lower quadrant abdominal wall abscess.  Underwent TEE on 07/31/2020, difficult study but no clear evidence of endocarditis. --Infectious disease following, appreciate assistance --Discontinued cefazolin today, receiving oritavancin 1200 mg IV x1 today --ID  performing penicillin allergy testing to determine if will discharge on clindamycin versus Augmentin for dental infection --Supportive care  Right facial cellulitis Dental caries Patient with advanced dental disease and multiple caries associated with facial cellulitis.  CT with periapical radiolucency and potential subperiosteal abscess related to tooth #6 but not drainable at this time per OMFS.  Unlikely bacterial related to dental origin and would recommend continue current antibiotic regimen and follow-up outpatient for extraction of remaining nonrestorable dentition per Dr. Ross Marcus.  Left upper extremity nodules secondary to thrombophlebitis Patient with 2 small left upper extremity nodules that are erythematous and tender to palpation.  Ultrasound soft tissue on 07/29/2020 with ill-defined complex fluid collection 2.3 x 1.5 x 1.4 cm without drainable fluid collection or foreign body identified. --Continue to monitor areas closely, currently stable without increased size  Essential hypertension BP 137/72 this morning, well controlled. --Continue amlodipine 5 mg p.o. daily --Hydralazine 5 mg IV every 4 hours as needed SBP greater than 170  Hx IV drug abuse, relapse UDS on admission positive for opiates/cocaine.  Counseled on need for complete cessation.  Anxiety:  --Continue fluoxetine 20 mg p.o. daily --Hydroxyzine 100 mg p.o. 3 times daily  Neuropathy Chronic pain syndrome --Gabapentin 900 mg 0700 and 1300 --Gabapentin 1200 mg nightly --outpatient referral placed to pain management  Morbid obesity Body mass index is 51.36 kg/m.  Counseled on need for aggressive lifestyle changes/weight loss as this complicates all facets of care.  Tobacco use disorder Counseled on need for complete cessation.   DVT prophylaxis: SCDs Code Status: Full code Family Communication: Updated patient extensively at bedside  Disposition Plan:  Status is: Inpatient  Remains inpatient  appropriate because:Ongoing diagnostic testing needed not appropriate for outpatient work up, Unsafe d/c plan, IV treatments appropriate due to intensity of illness or inability to take PO and Inpatient level of care appropriate due to severity of illness ID giving 1 dose of oritavancin today and conducting penicillin allergy testing determine if will discharge on clindamycin versus Augmentin.  Hopeful for discharge in 1-2 days.   Dispo: The patient is from: Home              Anticipated d/c is to: Home              Anticipated d/c date is: 2 days              Patient currently is not medically stable to d/c.   Consultants:   Orthopedics, Dr. Amanda Pea  Infectious disease  OMFS, Dr. Ross Marcus  Procedures:   TTE  TEE   Antimicrobials:   Vancomycin  9/9 - 9/13  Clindamycin 9/9, 9/11>>  Cefazolin 9/13 - 9/17  Oritavancin 12 mg IV x1 on 08/01/2020   Subjective: Patient seen and examined bedside, resting comfortably.  Continues to complain of chronic pain, mostly to back.  Infectious disease giving 1 dose of oritavancin today and performing penicillin allergy testing to determine if patient were discharged on clindamycin versus Augmentin for dental infection.  No other complaints or concerns at this time.  Denies headache, no chest pain, no palpitations, no shortness of breath, no fever/chills/night sweats, no nausea/vomiting/diarrhea, no weakness, no fatigue, no paresthesias.  No acute events overnight per nursing staff.  Objective: Vitals:   07/31/20 1532 07/31/20 1900 08/01/20 0500 08/01/20 0824  BP: 128/80 132/77 137/72 (!) 155/97  Pulse: 89 90 80 67  Resp: 16 17 17 17   Temp: 98.7 F (37.1 C) 98.8 F (37.1 C) 98.7 F (37.1 C) 98.1 F (36.7 C)  TempSrc: Oral Oral Oral Oral  SpO2: 99% 100% 99% 98%  Weight:      Height:        Intake/Output Summary (Last 24 hours) at 08/01/2020 1406 Last data filed at 08/01/2020 1114 Gross per 24 hour  Intake 715.71 ml  Output --  Net  715.71 ml   Filed Weights   07/24/20 1811  Weight: (!) 144.3 kg    Examination:  General exam: Appears calm and comfortable, appears older than stated age, poor dentition Respiratory system: Clear to auscultation. Respiratory effort normal.  Oxygenating well on room air Cardiovascular system: S1 & S2 heard, RRR. No JVD, murmurs, rubs, gallops or clicks. No pedal edema. Gastrointestinal system: Abdomen is nondistended, soft and nontender. No organomegaly or masses felt. Normal bowel sounds heard. Central nervous system: Alert and oriented. No focal neurological deficits. Extremities: Symmetric 5 x 5 power. Skin: two left anterior upper extremity nodule with surrounding erythema which are slightly painful to palpation and stable in size, right lower quadrant cellulitis continues to improve with decreased drainage Psychiatry: Judgement and insight appear poor. Mood & affect appropriate.     Data Reviewed: I have personally reviewed following labs and imaging studies  CBC: Recent Labs  Lab 07/29/20 0538 07/30/20 0436  WBC 6.4 6.0  HGB 11.0* 10.9*  HCT 35.2* 35.3*  MCV 77.9* 76.4*  PLT 363 342   Basic Metabolic Panel: Recent Labs  Lab 07/29/20 0538 07/30/20 0436  NA 137 137  K 4.3 4.0  CL 103 103  CO2 25 25  GLUCOSE 94 95  BUN 15 17  CREATININE 0.94 0.91  CALCIUM 9.3 9.2  GFR: Estimated Creatinine Clearance: 145.1 mL/min (by C-G formula based on SCr of 0.91 mg/dL). Liver Function Tests: No results for input(s): AST, ALT, ALKPHOS, BILITOT, PROT, ALBUMIN in the last 168 hours. No results for input(s): LIPASE, AMYLASE in the last 168 hours. No results for input(s): AMMONIA in the last 168 hours. Coagulation Profile: No results for input(s): INR, PROTIME in the last 168 hours. Cardiac Enzymes: No results for input(s): CKTOTAL, CKMB, CKMBINDEX, TROPONINI in the last 168 hours. BNP (last 3 results) No results for input(s): PROBNP in the last 8760 hours. HbA1C: No  results for input(s): HGBA1C in the last 72 hours. CBG: No results for input(s): GLUCAP in the last 168 hours. Lipid Profile: No results for input(s): CHOL, HDL, LDLCALC, TRIG, CHOLHDL, LDLDIRECT in the last 72 hours. Thyroid Function Tests: No results for input(s): TSH, T4TOTAL, FREET4, T3FREE, THYROIDAB in the last 72 hours. Anemia Panel: No results for input(s): VITAMINB12, FOLATE, FERRITIN, TIBC, IRON, RETICCTPCT in the last 72 hours. Sepsis Labs: No results for input(s): PROCALCITON, LATICACIDVEN in the last 168 hours.  Recent Results (from the past 240 hour(s))  Culture, blood (routine x 2)     Status: Abnormal   Collection Time: 07/24/20  6:37 PM   Specimen: BLOOD RIGHT ARM  Result Value Ref Range Status   Specimen Description   Final    BLOOD RIGHT ARM Performed at [facility], [address] [place], [place] [address]    Special Requests   Final    BOTTLES DRAWN AEROBIC AND ANAEROBIC Blood Culture adequate volume Performed at [facility], [address] [place], [place] [address]    Culture  Setup Time   Final    GRAM POSITIVE COCCI IN CLUSTERS ANAEROBIC BOTTLE ONLY CRITICAL RESULT CALLED TO, READ BACK BY AND VERIFIED WITHCandiss Norse [facility] 2051 07/25/20 A BROWNING Performed at [facility], [address] [place], [place] [address]    Culture STAPHYLOCOCCUS AUREUS (A)  Final   Report Status 07/27/2020 FINAL  Final   Organism ID, Bacteria STAPHYLOCOCCUS AUREUS  Final      Susceptibility   Staphylococcus aureus - MIC*    CIPROFLOXACIN <=0.5 SENSITIVE Sensitive     ERYTHROMYCIN <=0.25 SENSITIVE Sensitive     GENTAMICIN <=0.5 SENSITIVE Sensitive     OXACILLIN 0.5 SENSITIVE Sensitive     TETRACYCLINE <=1 SENSITIVE Sensitive     VANCOMYCIN 1 SENSITIVE Sensitive     TRIMETH/SULFA <=10 SENSITIVE Sensitive     CLINDAMYCIN <=0.25 SENSITIVE Sensitive     RIFAMPIN <=0.5 SENSITIVE Sensitive     Inducible Clindamycin NEGATIVE Sensitive      * STAPHYLOCOCCUS AUREUS  Blood Culture ID Panel (Reflexed)     Status: Abnormal   Collection Time: 07/24/20  6:37 PM  Result Value Ref Range Status   Enterococcus faecalis NOT DETECTED NOT DETECTED Final   Enterococcus Faecium NOT DETECTED NOT DETECTED Final   Listeria monocytogenes NOT DETECTED NOT DETECTED Final   Staphylococcus species DETECTED (A) NOT DETECTED Final    Comment: CRITICAL RESULT CALLED TO, READ BACK BY AND VERIFIED WITHCandiss Norse [facility] PHARMD 2051 07/25/20 A BROWNING    Staphylococcus aureus (BCID) DETECTED (A) NOT DETECTED Final    Comment: CRITICAL RESULT CALLED TO, READ BACK BY AND VERIFIED WITH: H VON [facility] PHARMD 2051 07/25/20 A BROWNING    Staphylococcus epidermidis NOT DETECTED NOT DETECTED Final   Staphylococcus lugdunensis NOT DETECTED NOT DETECTED Final   Streptococcus  species NOT DETECTED NOT DETECTED Final   Streptococcus agalactiae NOT DETECTED NOT DETECTED Final   Streptococcus pneumoniae NOT DETECTED NOT DETECTED Final   Streptococcus pyogenes NOT DETECTED NOT DETECTED Final   A.calcoaceticus-baumannii NOT DETECTED NOT DETECTED Final   Bacteroides fragilis NOT DETECTED NOT DETECTED Final   Enterobacterales NOT DETECTED NOT DETECTED Final   Enterobacter cloacae complex NOT DETECTED NOT DETECTED Final   Escherichia coli NOT DETECTED NOT DETECTED Final   Klebsiella aerogenes NOT DETECTED NOT DETECTED Final   Klebsiella oxytoca NOT DETECTED NOT DETECTED Final   Klebsiella pneumoniae NOT DETECTED NOT DETECTED Final   Proteus species NOT DETECTED NOT DETECTED Final   Salmonella species NOT DETECTED NOT DETECTED Final   Serratia marcescens NOT DETECTED NOT DETECTED Final   Haemophilus influenzae NOT DETECTED NOT DETECTED Final   Neisseria meningitidis NOT DETECTED NOT DETECTED Final   Pseudomonas aeruginosa NOT DETECTED NOT DETECTED Final   Stenotrophomonas maltophilia NOT DETECTED NOT DETECTED Final   Candida albicans NOT DETECTED NOT DETECTED Final    Candida auris NOT DETECTED NOT DETECTED Final   Candida glabrata NOT DETECTED NOT DETECTED Final   Candida krusei NOT DETECTED NOT DETECTED Final   Candida parapsilosis NOT DETECTED NOT DETECTED Final   Candida tropicalis NOT DETECTED NOT DETECTED Final   Cryptococcus neoformans/gattii NOT DETECTED NOT DETECTED Final   Meth resistant mecA/C and MREJ NOT DETECTED NOT DETECTED Final    Comment: Performed at [facility], [address] [place], [place] [address]  SARS Coronavirus 2 by RT PCR (hospital order, performed in [facility] hospital lab) Nasopharyngeal Peripheral     Status: None   Collection Time: 07/24/20  7:28 PM   Specimen: Peripheral; Nasopharyngeal  Result Value Ref Range Status   SARS Coronavirus 2 NEGATIVE NEGATIVE Final    Comment: (NOTE) SARS-CoV-2 target nucleic acids are NOT DETECTED.  The SARS-CoV-2 RNA is generally detectable in upper and lower respiratory specimens during the acute phase of infection. The lowest concentration of SARS-CoV-2 viral copies this assay can detect is 250 copies / mL. A negative result does not preclude SARS-CoV-2 infection and should not be used as the sole basis for treatment or other patient management decisions.  A negative result may occur with improper specimen collection / handling, submission of specimen other than nasopharyngeal swab, presence of viral mutation(s) within the areas targeted by this assay, and inadequate number of viral copies (<250 copies / mL). A negative result must be combined with clinical observations, patient history, and epidemiological information.  Fact Sheet for Patients:   BoilerBrush.com.cy  Fact Sheet for Healthcare Providers: https://pope.com/  This test is not yet approved or  cleared by the [place] FDA and has been authorized for detection and/or diagnosis of SARS-CoV-2 by FDA under an Emergency Use Authorization (EUA).  This EUA will  remain in effect (meaning this test can be used) for the duration of the COVID-19 declaration under Section 564(b)(1) of the Act, 21 U.S.C. section 360bbb-3(b)(1), unless the authorization is terminated or revoked sooner.  Performed at [facility], [address] [place], [place] [address]   MRSA PCR Screening     Status: Abnormal   Collection Time: 07/25/20  4:21 AM   Specimen: Nasopharyngeal  Result Value Ref Range Status   MRSA by PCR POSITIVE (A) NEGATIVE Final    Comment:        The GeneXpert MRSA Assay (FDA approved for NASAL specimens only), is one component of  a comprehensive MRSA colonization surveillance program. It is not intended to diagnose MRSA infection nor to guide or monitor treatment for MRSA infections. RESULT CALLED TO, READ BACK BY AND VERIFIED WITH: RN Jasmine December [facility] 1610 [phone] FCP Performed at [facility], [address] [place], [place] [address]   Culture, blood (Routine X 2) w Reflex to ID Panel     Status: None   Collection Time: 07/26/20  5:34 PM   Specimen: BLOOD LEFT ARM  Result Value Ref Range Status   Specimen Description BLOOD LEFT ARM  Final   Special Requests   Final    BOTTLES DRAWN AEROBIC AND ANAEROBIC Blood Culture adequate volume   Culture   Final    NO GROWTH 5 DAYS Performed at [facility], [address] [place], [place] [address]    Report Status 07/31/2020 FINAL  Final  Culture, blood (Routine X 2) w Reflex to ID Panel     Status: Abnormal   Collection Time: 07/26/20  5:46 PM   Specimen: BLOOD LEFT HAND  Result Value Ref Range Status   Specimen Description BLOOD LEFT HAND  Final   Special Requests   Final    BOTTLES DRAWN AEROBIC ONLY Blood Culture adequate volume   Culture  Setup Time   Final    GRAM POSITIVE COCCI IN CLUSTERS GRAM POSITIVE COCCI IN CHAINS AEROBIC BOTTLE ONLY Organism ID to follow CRITICAL RESULT CALLED TO, READ BACK BY AND VERIFIED WITH: G. ABBOTT,PHARMD 0405 07/28/2020  T. TYSOR    Culture (A)  Final    STAPHYLOCOCCUS CAPITIS STREPTOCOCCUS PARASANGUINIS THE SIGNIFICANCE OF ISOLATING THIS ORGANISM FROM A SINGLE SET OF BLOOD CULTURES WHEN MULTIPLE SETS ARE DRAWN IS UNCERTAIN. PLEASE NOTIFY THE MICROBIOLOGY DEPARTMENT WITHIN ONE WEEK IF SPECIATION AND SENSITIVITIES ARE REQUIRED. Performed at [facility], [address], [place], [place] [address]    Report Status 08/01/2020 FINAL  Final  Blood Culture ID Panel (Reflexed)     Status: Abnormal   Collection Time: 07/26/20  5:46 PM  Result Value Ref Range Status   Enterococcus faecalis NOT DETECTED NOT DETECTED Final   Enterococcus Faecium NOT DETECTED NOT DETECTED Final   Listeria monocytogenes NOT DETECTED NOT DETECTED Final   Staphylococcus species DETECTED (A) NOT DETECTED Final    Comment: CRITICAL RESULT CALLED TO, READ BACK BY AND VERIFIED WITH: G. ABBOTT,PHARMD 0405 07/28/2020 T. TYSOR    Staphylococcus aureus (BCID) NOT DETECTED NOT DETECTED Final   Staphylococcus epidermidis NOT DETECTED NOT DETECTED Final   Staphylococcus lugdunensis NOT DETECTED NOT DETECTED Final   Streptococcus species DETECTED (A) NOT DETECTED Final    Comment: Not Enterococcus species, Streptococcus agalactiae, Streptococcus pyogenes, or Streptococcus pneumoniae. CRITICAL RESULT CALLED TO, READ BACK BY AND VERIFIED WITH: G. ABBOTT,PHARMD 0405 07/28/2020 T. TYSOR    Streptococcus agalactiae NOT DETECTED NOT DETECTED Final   Streptococcus pneumoniae NOT DETECTED NOT DETECTED Final   Streptococcus pyogenes NOT DETECTED NOT DETECTED Final   A.calcoaceticus-baumannii NOT DETECTED NOT DETECTED Final   Bacteroides fragilis NOT DETECTED NOT DETECTED Final   Enterobacterales NOT DETECTED NOT DETECTED Final   Enterobacter cloacae complex NOT DETECTED NOT DETECTED Final   Escherichia coli NOT DETECTED NOT DETECTED Final   Klebsiella aerogenes NOT DETECTED NOT DETECTED Final   Klebsiella oxytoca NOT DETECTED NOT DETECTED  Final   Klebsiella pneumoniae NOT DETECTED NOT DETECTED Final   Proteus species NOT DETECTED NOT DETECTED Final   Salmonella species NOT DETECTED NOT DETECTED Final  Serratia marcescens NOT DETECTED NOT DETECTED Final   Haemophilus influenzae NOT DETECTED NOT DETECTED Final   Neisseria meningitidis NOT DETECTED NOT DETECTED Final   Pseudomonas aeruginosa NOT DETECTED NOT DETECTED Final   Stenotrophomonas maltophilia NOT DETECTED NOT DETECTED Final   Candida albicans NOT DETECTED NOT DETECTED Final   Candida auris NOT DETECTED NOT DETECTED Final   Candida glabrata NOT DETECTED NOT DETECTED Final   Candida krusei NOT DETECTED NOT DETECTED Final   Candida parapsilosis NOT DETECTED NOT DETECTED Final   Candida tropicalis NOT DETECTED NOT DETECTED Final   Cryptococcus neoformans/gattii NOT DETECTED NOT DETECTED Final    Comment: Performed at [facility], [address], [place], [place] [address]  Culture, blood (Routine X 2) w Reflex to ID Panel     Status: None   Collection Time: 07/27/20 11:10 AM   Specimen: BLOOD RIGHT ARM  Result Value Ref Range Status   Specimen Description BLOOD RIGHT ARM  Final   Special Requests   Final    BOTTLES DRAWN AEROBIC AND ANAEROBIC Blood Culture adequate volume   Culture   Final    NO GROWTH 5 DAYS Performed at [facility], [address], [place], [place] [address]    Report Status 08/01/2020 FINAL  Final         Radiology Studies: ECHO TEE  Result Date: 07/31/2020    TRANSESOPHOGEAL ECHO REPORT   Patient Name:   VEDANT SHEHADEH Date of Exam: 07/31/2020 Medical Rec #:  [id number]       Height:       66.0 in Accession #:    [id number]      Weight:       318.2 lb Date of Birth:  1976/09/24       BSA:          2.436 m Patient Age:    43 years        BP:           134/69 mmHg Patient Gender: M               HR:           79 bpm. Exam Location:  Inpatient Procedure: Transesophageal Echo and Color Doppler Indications:     Bacteremia 790.7   History:         Patient has prior history of Echocardiogram examinations, most                  recent 07/26/2020. Risk Factors:Hypertension. Polysubstance                  abuse. IV drug use.  Sonographer:     Tiffany Dance Referring Phys:  [id number] Manson Passey Diagnosing Phys: Jodelle Red MD PROCEDURE: The transesophogeal probe was passed without difficulty through the esophogus of the patient. Sedation performed by different physician. The patient was monitored while under deep sedation. Anesthestetic sedation was provided intravenously by Anesthesiology: 584.42mg  of Propofol. Patients was under conscious sedation during this procedure., 3mg  of Versed. Image quality was technically difficult. The patient's vital signs; including heart rate, blood pressure, and oxygen saturation; remained stable throughout the procedure. The patient developed no complications during the procedure. IMPRESSIONS  1. Left ventricular ejection fraction, by estimation, is 60 to 65%. The left ventricle has normal function. The left ventricle has no regional wall motion abnormalities.  2. Right ventricular systolic function is normal. The right ventricular size is normal.  3. No left atrial/left atrial  appendage thrombus was detected.  4. There is some thickening in the subvalvular apparatus but no clear vegetation.. The mitral valve is normal in structure. No evidence of mitral valve regurgitation. No evidence of mitral stenosis.  5. There is thickening of the leaflet tip and subvalvular apparatus, but no clear vegetation.  6. The aortic valve is tricuspid. Aortic valve regurgitation is not visualized. No aortic stenosis is present. Conclusion(s)/Recommendation(s): Very difficult study. Even with high doses of anesthesia, he was somewhat intolerant of the procedure. No good transgastric views, and windows are off axis by about 60 degrees. There is some thickening in the chordal structures below the mitral and tricuspid  valves but no clear endocarditis seen. FINDINGS  Left Ventricle: Left ventricular ejection fraction, by estimation, is 60 to 65%. The left ventricle has normal function. The left ventricle has no regional wall motion abnormalities. The left ventricular internal cavity size was normal in size. Right Ventricle: The right ventricular size is normal. No increase in right ventricular wall thickness. Right ventricular systolic function is normal. Left Atrium: Left atrial size was not assessed. No left atrial/left atrial appendage thrombus was detected. Right Atrium: Right atrial size was not assessed. Prominent Chiari network. Pericardium: There is no evidence of pericardial effusion. Mitral Valve: There is some thickening in the subvalvular apparatus but no clear vegetation. The mitral valve is normal in structure. No evidence of mitral valve regurgitation. No evidence of mitral valve stenosis. There is no evidence of mitral valve vegetation. Tricuspid Valve: There is thickening of the leaflet tip and subvalvular apparatus, but no clear vegetation. The tricuspid valve is normal in structure. Tricuspid valve regurgitation is trivial. No evidence of tricuspid stenosis. There is no evidence of tricuspid valve vegetation. Aortic Valve: The aortic valve is tricuspid. Aortic valve regurgitation is not visualized. No aortic stenosis is present. There is no evidence of aortic valve vegetation. Pulmonic Valve: The pulmonic valve was grossly normal. Pulmonic valve regurgitation is not visualized. Aorta: The aortic root and ascending aorta are structurally normal, with no evidence of dilitation. IAS/Shunts: No atrial level shunt detected by color flow Doppler. Jodelle Red MD Electronically signed by Jodelle Red MD Signature Date/Time: 07/31/2020/6:01:53 PM    Final         Scheduled Meds: . amLODipine  5 mg Oral Daily  . benzylpenicilloyl polylysine  0.1 mL Intradermal Once  . benzylpenicilloyl  polylysine  0.1 mL Intradermal Once  . benzylpenicilloyl polylysine  0.06 mL Topical Once  . chlorhexidine  15 mL Mouth/Throat QID  . clindamycin  450 mg Oral Q8H  . FLUoxetine  20 mg Oral Daily  . gabapentin  900 mg Oral q AM  . gabapentin  900 mg Oral Q24H  . gabapentin  1,200 mg Oral QHS  . histamine phosphate  0.1 mL Topical Once  . hydrOXYzine  100 mg Oral TID  . LORazepam  1 mg Oral QHS  . pantoprazole  40 mg Oral Daily  . penicillin G in sodium chloride 10,000 units/mL vial for skin test  1,000 Units Intradermal Once  . penicillin G in sodium chloride 10,000 units/mL vial for skin test  1,000 Units Intradermal Once  . penicillin G in sodium chloride 10,000 units/mL vial for skin test  1,000 Units Topical Once  . sodium chloride flush  0.1 mL Intradermal Once  . sodium chloride flush  0.1 mL Topical Once   Continuous Infusions: . sodium chloride Stopped (07/27/20 1334)  . oritavancin (ORBACTIV) IVPB 1,200 mg (08/01/20 1258)  LOS: 7 days    Time spent: 36 minutes spent on chart review, discussion with nursing staff, consultants, updating family and interview/physical exam; more than 50% of that time was spent in counseling and/or coordination of care.    Alvira PhilipsEric J [place], DO [facility] Available via Epic secure chat 7am-7pm After these hours, please refer to coverage provider listed on [email or web address] 08/01/2020, 2:06 PM

## 2020-08-01 NOTE — Progress Notes (Signed)
Amoxicillin given at 17:56, vital signs taken and recorded in interval for 1hr. No reactions noted, will endorse accordingly to oncoming RN.

## 2020-08-01 NOTE — Plan of Care (Signed)

## 2020-08-01 NOTE — Plan of Care (Signed)

## 2020-08-02 MED ORDER — GABAPENTIN 300 MG PO CAPS: 900.0000 mg | ORAL_CAPSULE | Freq: Two times a day (BID) | ORAL | 0 refills | Status: AC

## 2020-08-02 MED ORDER — FLUOXETINE HCL 20 MG PO CAPS: 20.0000 mg | ORAL_CAPSULE | Freq: Every day | ORAL | 0 refills | Status: AC

## 2020-08-02 MED ORDER — HYDROXYZINE PAMOATE 50 MG PO CAPS: 100.0000 mg | ORAL_CAPSULE | Freq: Three times a day (TID) | ORAL | 0 refills | Status: AC

## 2020-08-02 MED ORDER — HYDROCHLOROTHIAZIDE 12.5 MG PO TABS: 12.5000 mg | ORAL_TABLET | Freq: Every day | ORAL | 0 refills | Status: AC

## 2020-08-02 MED ORDER — GABAPENTIN 600 MG PO TABS: 1200.0000 mg | ORAL_TABLET | Freq: Every day | ORAL | 0 refills | Status: AC

## 2020-08-02 MED ORDER — AMOXICILLIN-POT CLAVULANATE 875-125 MG PO TABS: 1.0000 | ORAL_TABLET | Freq: Two times a day (BID) | ORAL | 0 refills | Status: AC

## 2020-08-02 MED ORDER — MELOXICAM 15 MG PO TABS: 15.0000 mg | ORAL_TABLET | Freq: Every day | ORAL | 0 refills | Status: AC

## 2020-08-02 MED ORDER — TIZANIDINE HCL 4 MG PO TABS: 4.0000 mg | ORAL_TABLET | Freq: Three times a day (TID) | ORAL | 0 refills | Status: AC | PRN

## 2020-08-02 MED ORDER — AMLODIPINE BESYLATE 5 MG PO TABS: 5.0000 mg | ORAL_TABLET | Freq: Every day | ORAL | 0 refills | Status: AC

## 2020-08-02 MED ORDER — PANTOPRAZOLE SODIUM 40 MG PO TBEC: 40.0000 mg | DELAYED_RELEASE_TABLET | Freq: Every day | ORAL | 0 refills | Status: AC

## 2020-08-02 NOTE — Plan of Care (Signed)
  Problem: Pain Managment: Goal: General experience of comfort will improve Outcome: Progressing   Problem: Safety: Goal: Ability to remain free from injury will improve Outcome: Progressing   Problem: Skin Integrity: Goal: Risk for impaired skin integrity will decrease Outcome: Progressing   

## 2020-08-02 NOTE — Plan of Care (Signed)
°  Problem: Pain Managment: Goal: General experience of comfort will improve 08/02/2020 0320 by Dalbert Batman, RN Outcome: Progressing 08/02/2020 0319 by Dalbert Batman, RN Outcome: Progressing   Problem: Safety: Goal: Ability to remain free from injury will improve 08/02/2020 0320 by Dalbert Batman, RN Outcome: Progressing 08/02/2020 0319 by Dalbert Batman, RN Outcome: Progressing   Problem: Skin Integrity: Goal: Risk for impaired skin integrity will decrease 08/02/2020 0320 by Dalbert Batman, RN Outcome: Progressing 08/02/2020 0319 by Dalbert Batman, RN Outcome: Progressing

## 2020-08-02 NOTE — Progress Notes (Signed)
Pt is ambulatory, denies discomfort. Wants to go home. Discharge instructions given to pt, wound care teachings done. Discharged to home picked up by his mother.

## 2020-08-02 NOTE — Discharge Summary (Signed)
Physician Discharge Summary  Joshua HoraSteven Vorce ZOX:[id number] DOB: 03/19/1976 DOA: 07/24/2020  PCP: Patient, No Pcp Per  Admit date: 07/24/2020 Discharge date: 08/02/2020  Admitted From: Home Disposition: Home  Recommendations for Outpatient Follow-up:  1. Follow up with PCP in 1-2 weeks 2. Follow-up with infectious disease, Dr. Orvan Falconerampbell as needed 3. Follow-up with oral maxillofacial surgery, Dr. Ross MarcusSherwood 4. Outpatient referral sent to orthopedics/pain management 5. 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  Reports pain caused him to relapse with his IV drug condition.  Also notable wound on his abdomen 3 days prior to urgent care presentation.  In [facility] ED [facility],CT maxillofacial revealed right facial cellulitis, probably odontogenic with infection source suspected to be carious right maxillary,subperiosteal abscess suspected near the root of the tooth. CT abdomen and pelvis showing skin thickening with ill-defined edema involving the right lower anterior abdominal wall consistent with cellulitis.  Temperature 99.4, BP 180/107, WBC count 12.9 with neutrophilia.TRH asked to admit for cellulitis. Started on IV antibiotics empirically in the ED, IV vancomycin.  Hospital course:  MSSA septicemia, present on admission Patient presenting with progressive right facial pain/swelling.  Was found to have an elevated WBC count of 12.9.  Etiology likely secondary to spontaneous  rupture right lower quadrant abdominal wall abscess.  Underwent TEE on 07/31/2020, difficult study but no clear evidence of endocarditis.  Infectious disease was consulted and followed during hospital course.  He was initially treated with broad-spectrum antibiotics with vancomycin which was subsequently deescalated to cefazolin.  Patient received 1 dose of IV oritavancin 1200 mg on 08/01/2020.  Infectious disease performed penicillin allergy testing which was negative, and penicillin allergy was subsequently removed from his chart.  Patient will continue oral antibiotics with Augmentin through 07/06/2020 per ID recommendations.  Right facial cellulitis Dental caries Patient with advanced dental disease and multiple caries associated with facial cellulitis.  CT with periapical radiolucency and potential subperiosteal abscess related to tooth #6 but not drainable at this time per OMFS. Unlikely bacterial related to dental origin and would recommend continue current antibiotic regimen and follow-up outpatient for extraction of remaining nonrestorable dentition per Dr. Ross MarcusSherwood.  Continue Augmentin as above to complete antibiotic course for dental infection.  Left upper extremity nodules secondary to thrombophlebitis Patient with 2 small left upper extremity nodules that are erythematous and tender to palpation.  Ultrasound soft tissue on 07/29/2020 with ill-defined complex fluid collection 2.3 x 1.5 x 1.4 cm without drainable fluid collection or foreign body identified.  Areas of concern stable, decreasing in size.  Discussed with patient to monitor closely and if signs or symptoms of worsening infection to return to the ED.  Essential hypertension Continue amlodipine, HCTZ  Hx IV drug abuse, relapse UDS on admission positive for opiates/cocaine.  Counseled on need for complete cessation.  Anxiety:  Continue fluoxetine 20 mg p.o. daily, Hydroxyzine 100 mg p.o. 3 times daily  Neuropathy Chronic  pain syndrome Gabapentin 900 BID and 1200 mg nightly. Outpatient referral placed to pain management and orthopedics per patient request.  Morbid obesity Body mass  index is 51.36 kg/m.  Counseled on need for aggressive lifestyle changes/weight loss as this complicates all facets of care.  Tobacco use disorder Counseled on need for complete cessation.  Discharge Diagnoses:  Principal Problem:   MSSA bacteremia Active Problems:   Chronic pain syndrome   Pain of both hip joints   Microcytic anemia   Facial cellulitis   Polysubstance dependence (HCC)   IV drug user   Dental infection   Lower back pain   Thrombophlebitis arm   Abdominal wall abscess    Discharge Instructions  Discharge Instructions    AMB referral to orthopedics   Complete by: As directed    Ambulatory referral to Pain Clinic   Complete by: As directed    Call MD for:  difficulty breathing, headache or visual disturbances   Complete by: As directed    Call MD for:  extreme fatigue   Complete by: As directed    Call MD for:  persistant dizziness or light-headedness   Complete by: As directed    Call MD for:  persistant nausea and vomiting   Complete by: As directed    Call MD for:  severe uncontrolled pain   Complete by: As directed    Call MD for:  temperature >100.4   Complete by: As directed    Diet - low sodium heart healthy   Complete by: As directed    Increase activity slowly   Complete by: As directed    No wound care   Complete by: As directed      Allergies as of 08/02/2020   No Active Allergies     Medication List    TAKE these medications   acetaminophen 500 MG tablet Commonly known as: TYLENOL Take 1,000 mg by mouth as needed for moderate pain or headache.   amLODipine 5 MG tablet Commonly known as: NORVASC Take 1 tablet (5 mg total) by mouth daily.   amoxicillin-clavulanate 875-125 MG tablet Commonly known as: Augmentin Take 1 tablet by mouth 2 (two) times daily for 4  days.   Buprenorphine HCl-Naloxone HCl 8-2 MG Film Place 1 Film under the tongue in the morning and at bedtime.   diclofenac Sodium 1 % Gel Commonly known as: VOLTAREN Apply 2 g topically as needed (pain).   FLUoxetine 20 MG capsule Commonly known as: PROZAC Take 1 capsule (20 mg total) by mouth daily.   gabapentin 300 MG capsule Commonly known as: NEURONTIN Take 3 capsules (900 mg total) by mouth 2 (two) times daily.   gabapentin 600 MG tablet Commonly known as: NEURONTIN Take 2 tablets (1,200 mg total) by mouth at bedtime.   hydrochlorothiazide 12.5 MG tablet Commonly known as: HYDRODIURIL Take 1 tablet (12.5 mg total) by mouth daily.   hydrOXYzine 50 MG capsule Commonly known as: VISTARIL Take 2 capsules (100 mg total) by mouth 3 (three) times daily.   LORazepam 1 MG tablet Commonly known as: ATIVAN Take 1 tablet (1 mg total) by mouth at bedtime. What changed: when to take this   meloxicam 15 MG tablet Commonly known as: MOBIC Take 1 tablet (15 mg total) by mouth daily.   nicotine 21 mg/24hr patch Commonly known as: NICODERM CQ - dosed in mg/24 hours Place 21 mg onto the skin daily.   pantoprazole 40 MG tablet Commonly known as: PROTONIX Take 1 tablet (40 mg total) by mouth daily.   Poly-Iron 150 150 MG capsule Generic drug: iron polysaccharides Take 150 mg by mouth daily.   SM  Nicotine Polacrilex 2 MG gum Generic drug: nicotine polacrilex Take 2 mg by mouth as needed for smoking cessation.   tiZANidine 4 MG tablet Commonly known as: ZANAFLEX Take 1 tablet (4 mg total) by mouth every 8 (eight) hours as needed for muscle spasms.       Follow-up Information    Locklear, Darryl, DMD. Schedule an appointment as soon as possible for a visit in 1 week(s).   Specialty: Dentistry Why: please call on Monday to make appt for the tooth abscess Contact information: [address] [place] [address] [phone]        Exie Parody, DMD. Schedule  an appointment as soon as possible for a visit.   Specialty: Oral Surgery Why: Please call to make an appointment upon discharge for removal of discussed teeth.  Contact information: [address] [place] [address] [phone]              No Active Allergies  Consultations:  Orthopedics, Dr. Amanda Pea  Infectious disease  OMFS, Dr. Ross Marcus  Procedures/Studies: DG Chest 2 View  Result Date: 07/24/2020 CLINICAL DATA:  Suspected sepsis. Right facial swelling since yesterday. Chest pressure. EXAM: CHEST - 2 VIEW COMPARISON:  Most recent radiograph 07/29/2019. Most recent CT 03/05/2020 FINDINGS: Stable heart size and mediastinal contours. Borderline cardiomegaly. No focal airspace disease. No pulmonary edema. No pleural effusion or pneumothorax no evidence of acute osseous abnormality. IMPRESSION: No acute findings. Borderline cardiomegaly. Electronically Signed   By: Narda Rutherford M.D.   On: 07/24/2020 18:50   DG Forearm Right  Result Date: 07/24/2020 CLINICAL DATA:  Abscess.  Concern for foreign body. EXAM: RIGHT FOREARM - 2 VIEW COMPARISON:  None. FINDINGS: There is extensive soft tissue swelling about the forearm without evidence for an acute displaced fracture or dislocation. There is no radiographic evidence for osteomyelitis. There are no definite pockets of subcutaneous gas. There is no radiopaque foreign body. IMPRESSION: Extensive soft tissue swelling about the forearm without evidence for an acute displaced fracture or dislocation. No radiopaque foreign body. No radiographic evidence for osteomyelitis. Electronically Signed   By: Katherine Mantle M.D.   On: 07/24/2020 19:44   MR LUMBAR SPINE WO CONTRAST  Result Date: 07/27/2020 CLINICAL DATA:  Initial evaluation for acute low back pain, infection suspected. History of MSSA bacteremia. EXAM: MRI LUMBAR SPINE WITHOUT CONTRAST TECHNIQUE: Multiplanar, multisequence MR imaging of the lumbar spine was performed.  No intravenous contrast was administered. COMPARISON:  Prior MRI from 06/04/2019. FINDINGS: Segmentation: Standard. Lowest well-formed disc space labeled the L5-S1 level. Alignment: Physiologic with preservation of the normal of lumbar lordosis. No listhesis. Vertebrae: Vertebral body height maintained without acute or chronic fracture. Bone marrow signal intensity within normal limits. 12 mm benign hemangioma noted within the right sacral ala. No other discrete or worrisome osseous lesions. No abnormal edema to suggest osteomyelitis discitis or septic arthritis. Visualized SI joints symmetric and within normal limits. Conus medullaris and cauda equina: Conus extends to the L1 level. Conus and cauda equina appear normal. Paraspinal and other soft tissues: Visualized paraspinous soft tissues within normal limits without edema. No soft tissue collections or inflammatory changes. Partially visualized visceral structures within normal limits. Disc levels: T12-L1: Seen only on sagittal projection. Disc bulge with disc desiccation and mild reactive endplate changes. Superimposed left subarticular to foraminal disc protrusion (series 9, image 9). Mild posterior element hypertrophy. No significant spinal stenosis. Foramina remain patent. T12-L1: Unremarkable. L1-2:  Unremarkable. L2-3: Normal interspace. Mild facet hypertrophy. No  stenosis or impingement. L3-4: Normal interspace. Mild facet hypertrophy. No stenosis or impingement. L4-5: Disc desiccation with mild diffuse disc bulge. Superimposed small central disc protrusion with annular fissure. Mild to moderate facet and ligament flavum hypertrophy. Resultant mild spinal stenosis with mild left greater than right lateral recess narrowing. Mild left L4 foraminal stenosis. No significant right foraminal encroachment. L5-S1: Normal interspace. Moderate right with mild left facet hypertrophy. Mild epidural lipomatosis. No canal or foraminal stenosis. No impingement.  IMPRESSION: 1. No MRI evidence for acute infection within the lumbar spine. 2. Small central disc protrusion and facet hypertrophy at L4-5 with resultant mild spinal stenosis, with mild left greater than right lateral recess narrowing. 3. Mild to moderate facet hypertrophy at L2-3 through L5-S1, which could contribute to lower back pain. 4. Left subarticular to foraminal disc protrusion at T12-L1 without significant stenosis or neural impingement.\ Electronically Signed   By: Rise Mu M.D.   On: 07/27/2020 20:47   CT ABDOMEN PELVIS W CONTRAST  Result Date: 07/24/2020 CLINICAL DATA:  Abdominal abscess. Abdominal wall abscess. History of incompletely treated lumbar infection. Patient reports back and hip pain. EXAM: CT ABDOMEN AND PELVIS WITH CONTRAST TECHNIQUE: Multidetector CT imaging of the abdomen and pelvis was performed using the standard protocol following bolus administration of intravenous contrast. CONTRAST:  OMNIPAQUE IOHEXOL 300 MG/ML  SOLN COMPARISON:  Most recent abdominal CT 07/29/2019 FINDINGS: Lower chest: No basilar consolidation or pleural fluid. Hepatobiliary: Enlarged liver spanning 22.6 cm cranial caudal. Diffuse steatosis. No focal hepatic abnormality. Gallbladder unremarkable. No biliary dilatation. Pancreas: No ductal dilatation or inflammation. Spleen: Upper normal in size spanning 12.6 cm. No focal abnormality. Adrenals/Urinary Tract: Normal adrenal glands. No hydronephrosis or perinephric edema. Homogeneous renal enhancement with symmetric excretion on delayed phase imaging. Urinary bladder is minimally distended without wall thickening. Stomach/Bowel: Fluid distended stomach without gastric wall thickening. There is no small bowel obstruction or inflammatory change. Normal appendix. Scattered colonic diverticula without diverticulitis. No colonic wall thickening. Vascular/Lymphatic: Mild aortic atherosclerosis, advanced for age. No aortic aneurysm. Patent portal vein.  Prominent periportal nodes are likely reactive. Reproductive: Prostatic calcifications Other: There is skin thickening with ill-defined edema involving the right lower anterior abdominal wall. Ill-defined fluid/edema spans approximately 3.3 x 2.0 cm in transverse by AP dimension and 2.4 cm in depth. There is no peripherally enhancing or well-circumscribed collection. No soft tissue air. No additional abscess in the abdomen or pelvis. Small fat containing umbilical hernia. Mild fat in the inguinal canals. Musculoskeletal: Previous erosive changes involving the right L4-L5 facet have healed. There is no evidence of discitis or vertebral osteomyelitis. 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 No definite acute bony destruction by CT. No evidence of intramuscular collection. IMPRESSION: 1. Skin thickening with ill-defined edema involving the right lower anterior abdominal wall consistent with cellulitis. Ill-defined fluid/edema spans approximately 3.3 x 2.0 x 2.4 cm. No peripherally enhancing or well-circumscribed collection. No soft tissue air. 2. No other findings of infection in the abdomen or pelvis. No CT findings of acute spinal infection. 3. Hepatomegaly and hepatic steatosis. 4. Colonic diverticulosis without diverticulitis. 5. 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 No definite acute bony destruction by CT. Aortic Atherosclerosis (ICD10-I70.0). Electronically Signed   By: Narda Rutherford M.D.   On: 07/24/2020 21:10   MR HIP RIGHT WO CONTRAST  Result Date: 07/28/2020 CLINICAL DATA:  Low back and  right hip pain. EXAM: MR OF THE RIGHT HIP WITHOUT CONTRAST TECHNIQUE: Multiplanar, multisequence MR imaging was performed. No intravenous contrast was administered. COMPARISON:  CT scan 07/24/2020 FINDINGS: Examination limited by patient motion, body habitus and  patient refusing to continue the examination. Severe age advanced bilateral hip joint degenerative changes, left greater than right. Advanced joint space narrowing, osteophytic spurring, bony eburnation and subchondral cystic change. Large subchondral cyst/geode 0 noted in the left femoral head. No findings for stress fracture or AVN. Small amount of hip joint fluid bilaterally but no overt joint effusion. No periarticular fluid collections. No significant peritendinitis or trochanteric bursitis. The pubic symphysis and SI joints are intact. No pelvic fractures or bone lesions. A small hemangioma is noted in the right sacrum is dental E. The surrounding hip and pelvic musculature is grossly normal. No muscle tear, myositis or mass. The hamstring tendons are intact. No significant intrapelvic abnormalities are identified. IMPRESSION: 1. Severe age advanced bilateral hip joint degenerative changes, left greater than right. 2. No findings for stress fracture or AVN. 3. Intact bony pelvis. 4. No significant intrapelvic abnormalities. Electronically Signed   By: Rudie Meyer M.D.   On: 07/28/2020 05:38   [place] LIMITED JOINT SPACE STRUCTURES UP LEFT  Result Date: 07/29/2020 CLINICAL DATA:  Swelling and erythema in the medial left upper arm for 4-5 days. Question abscess. EXAM: ULTRASOUND LEFT UPPER EXTREMITY LIMITED TECHNIQUE: Ultrasound examination of the upper extremity soft tissues was performed in the area of clinical concern. COMPARISON:  None relevant. Outside ultrasound of the right upper arm performed 06/09/2020 is correlated. FINDINGS: There is an ill-defined complex fluid collection in the area of concern, measuring approximately 2.3 x 1.5 x 1.4 cm. There is edema interdigitating with subcutaneous fat in this region. There is surrounding increased vascularity on color Doppler consistent with hyperemia. No drainable fluid collection or foreign body identified. IMPRESSION: 1. Ill-defined complex fluid collection  in the area of concern in the left upper arm with surrounding hypervascularity, suspicious for a small abscess. 2. No drainable fluid collection or foreign body identified. Electronically Signed   By: Carey Bullocks M.D.   On: 07/29/2020 13:01   CT Maxillofacial W Contrast  Result Date: 07/24/2020 CLINICAL DATA:  44 year old male with right facial swelling since yesterday. Bed tooth. History of staph infection. EXAM: CT MAXILLOFACIAL WITH CONTRAST TECHNIQUE: Multidetector CT imaging of the maxillofacial structures was performed with intravenous contrast. Multiplanar CT image reconstructions were also generated. CONTRAST:  OMNIPAQUE IOHEXOL 300 MG/ML  SOLN COMPARISON:  Face CT 07/29/2018. FINDINGS: Osseous: Absent posterior dentition bilaterally with carious residual bilateral maxillary and mandible dentition. See soft tissue details below. Intact mandible. Other facial bones, central skull base, visible calvarium, an cervical vertebrae appear intact. Chronic degenerative changes about the odontoid. Orbits: Intact orbital walls. Disconjugate gaze but otherwise negative orbits soft tissues. Sinuses: Clear. Tympanic cavities and mastoids are clear. Soft tissues: Negative visible thyroid, larynx, pharynx, parapharyngeal spaces, sublingual space, submandibular spaces, parotid and masticator spaces. Negative retropharyngeal space aside from a retropharyngeal course of both carotids. Soft tissue swelling and stranding along the right anterior face overlying the buccal space. And along the right anterior maxillary alveolar process there is suggestion of a tiny subperiosteal abscess (non drainable series 3, image 36) in proximity to carious right maxillary probable canine tooth with some periapical lucency. No soft tissue gas. No other soft tissue fluid collection. Mild reactive appearing right level 1 and level 2 lymph nodes. Limited intracranial: The major vascular structures  in the neck and at the skull base are  patent. Partially retropharyngeal course of both carotid arteries, tortuous right ICA below the skull base. Negative visible brain parenchyma. IMPRESSION: 1. Right facial cellulitis, probably odontogenic with infection source suspected to be the carious right maxillary canine with a subtle/trace subperiosteal abscess suspected near the root of that tooth. 2. Carious dentition elsewhere. Mild reactive right level 1 and level 2 lymph nodes. Electronically Signed   By: Odessa Fleming M.D.   On: 07/24/2020 21:12   ECHOCARDIOGRAM COMPLETE  Result Date: 07/27/2020    ECHOCARDIOGRAM REPORT   Patient Name:   MAITLAND LESIAK Date of Exam: 07/26/2020 Medical Rec #:  [id number]       Height:       66.0 in Accession #:    [id number]      Weight:       318.2 lb Date of Birth:  [birth year]       BSA:          2.436 m Patient Age:    43 years        BP:           114/84 mmHg Patient Gender: M               HR:           63 bpm. Exam Location:  Inpatient Procedure: 2D Echo Indications:    bacteremia 790.7  History:        Patient has prior history of Echocardiogram examinations, most                 recent 08/18/2017. Risk Factors:IV drug use.  Sonographer:    Delcie Roch Referring Phys: [id number] SAIMA RIZWAN  Sonographer Comments: Image acquisition challenging due to patient body habitus. IMPRESSIONS  1. Left ventricular ejection fraction, by estimation, is 60 to 65%. The left ventricle has normal function. The left ventricle has no regional wall motion abnormalities. The left ventricular internal cavity size was mildly dilated. Left ventricular diastolic parameters were normal.  2. Right ventricular systolic function is normal. The right ventricular size is normal. Tricuspid regurgitation signal is inadequate for assessing PA pressure.  3. The mitral valve is normal in structure. No evidence of mitral valve regurgitation. No evidence of mitral stenosis.  4. The aortic valve is normal in structure. Aortic valve regurgitation is not  visualized. No aortic stenosis is present.  5. Aortic dilatation noted. There is mild dilatation of the aortic root, measuring 39 mm. There is borderline dilatation of the ascending aorta, measuring 37 mm.  6. The inferior vena cava is dilated in size with >50% respiratory variability, suggesting right atrial pressure of 8 mmHg. FINDINGS  Left Ventricle: Left ventricular ejection fraction, by estimation, is 60 to 65%. The left ventricle has normal function. The left ventricle has no regional wall motion abnormalities. The left ventricular internal cavity size was mildly dilated. There is  no left ventricular hypertrophy. Left ventricular diastolic parameters were normal. Normal left ventricular filling pressure. Right Ventricle: The right ventricular size is normal. No increase in right ventricular wall thickness. Right ventricular systolic function is normal. Tricuspid regurgitation signal is inadequate for assessing PA pressure. Left Atrium: Left atrial size was normal in size. Right Atrium: Right atrial size was normal in size. Pericardium: There is no evidence of pericardial effusion. Mitral Valve: The mitral valve is normal in structure. No evidence of mitral valve regurgitation. No evidence of mitral valve stenosis. Tricuspid Valve: The tricuspid valve  is normal in structure. Tricuspid valve regurgitation is not demonstrated. No evidence of tricuspid stenosis. Aortic Valve: The aortic valve is normal in structure. Aortic valve regurgitation is not visualized. No aortic stenosis is present. Pulmonic Valve: The pulmonic valve was normal in structure. Pulmonic valve regurgitation is not visualized. No evidence of pulmonic stenosis. Aorta: Aortic dilatation noted. There is mild dilatation of the aortic root, measuring 39 mm. There is borderline dilatation of the ascending aorta, measuring 37 mm. Venous: The inferior vena cava is dilated in size with greater than 50% respiratory variability, suggesting right atrial  pressure of 8 mmHg. IAS/Shunts: No atrial level shunt detected by color flow Doppler.  LEFT VENTRICLE PLAX 2D LVIDd:         5.60 cm  Diastology LVIDs:         3.60 cm  LV e' medial:    8.05 cm/s LV PW:         1.10 cm  LV E/e' medial:  12.0 LV IVS:        1.20 cm  LV e' lateral:   13.60 cm/s LVOT diam:     2.30 cm  LV E/e' lateral: 7.1 LV SV:         124 LV SV Index:   51 LVOT Area:     4.15 cm  RIGHT VENTRICLE RV S prime:     15.70 cm/s TAPSE (M-mode): 2.5 cm LEFT ATRIUM             Index       RIGHT ATRIUM           Index LA diam:        4.10 cm 1.68 cm/m  RA Area:     18.60 cm LA Vol (A2C):   50.0 ml 20.52 ml/m RA Volume:   49.90 ml  20.48 ml/m LA Vol (A4C):   48.2 ml 19.79 ml/m LA Biplane Vol: 50.2 ml 20.61 ml/m  AORTIC VALVE LVOT Vmax:   159.00 cm/s LVOT Vmean:  90.200 cm/s LVOT VTI:    0.298 m  AORTA Ao Root diam: 3.90 cm Ao Asc diam:  3.70 cm MITRAL VALVE MV Area (PHT): 3.42 cm    SHUNTS MV Decel Time: 222 msec    Systemic VTI:  0.30 m MV E velocity: 96.30 cm/s  Systemic Diam: 2.30 cm MV A velocity: 90.30 cm/s MV E/A ratio:  1.07 Armanda Magic MD Electronically signed by Armanda Magic MD Signature Date/Time: 07/27/2020/9:06:45 AM    Final    ECHO TEE  Result Date: 07/31/2020    TRANSESOPHOGEAL ECHO REPORT   Patient Name:   Joshua Jacobs Date of Exam: 07/31/2020 Medical Rec #:  [id number]       Height:       66.0 in Accession #:    [id number]      Weight:       318.2 lb Date of Birth:  08-19-1976       BSA:          2.436 m Patient Age:    43 years        BP:           134/69 mmHg Patient Gender: M               HR:           79 bpm. Exam Location:  Inpatient Procedure: Transesophageal Echo and Color Doppler Indications:     Bacteremia 790.7  History:  Patient has prior history of Echocardiogram examinations, most                  recent 07/26/2020. Risk Factors:Hypertension. Polysubstance                  abuse. IV drug use.  Sonographer:     Tiffany Dance Referring Phys:  [id number] Manson Passey Diagnosing Phys: Jodelle Red MD PROCEDURE: The transesophogeal probe was passed without difficulty through the esophogus of the patient. Sedation performed by different physician. The patient was monitored while under deep sedation. Anesthestetic sedation was provided intravenously by Anesthesiology: 584.42mg  of Propofol. Patients was under conscious sedation during this procedure., 3mg  of Versed. Image quality was technically difficult. The patient's vital signs; including heart rate, blood pressure, and oxygen saturation; remained stable throughout the procedure. The patient developed no complications during the procedure. IMPRESSIONS  1. Left ventricular ejection fraction, by estimation, is 60 to 65%. The left ventricle has normal function. The left ventricle has no regional wall motion abnormalities.  2. Right ventricular systolic function is normal. The right ventricular size is normal.  3. No left atrial/left atrial appendage thrombus was detected.  4. There is some thickening in the subvalvular apparatus but no clear vegetation.. The mitral valve is normal in structure. No evidence of mitral valve regurgitation. No evidence of mitral stenosis.  5. There is thickening of the leaflet tip and subvalvular apparatus, but no clear vegetation.  6. The aortic valve is tricuspid. Aortic valve regurgitation is not visualized. No aortic stenosis is present. Conclusion(s)/Recommendation(s): Very difficult study. Even with high doses of anesthesia, he was somewhat intolerant of the procedure. No good transgastric views, and windows are off axis by about 60 degrees. There is some thickening in the chordal structures below the mitral and tricuspid valves but no clear endocarditis seen. FINDINGS  Left Ventricle: Left ventricular ejection fraction, by estimation, is 60 to 65%. The left ventricle has normal function. The left ventricle has no regional wall motion abnormalities. The left ventricular internal  cavity size was normal in size. Right Ventricle: The right ventricular size is normal. No increase in right ventricular wall thickness. Right ventricular systolic function is normal. Left Atrium: Left atrial size was not assessed. No left atrial/left atrial appendage thrombus was detected. Right Atrium: Right atrial size was not assessed. Prominent Chiari network. Pericardium: There is no evidence of pericardial effusion. Mitral Valve: There is some thickening in the subvalvular apparatus but no clear vegetation. The mitral valve is normal in structure. No evidence of mitral valve regurgitation. No evidence of mitral valve stenosis. There is no evidence of mitral valve vegetation. Tricuspid Valve: There is thickening of the leaflet tip and subvalvular apparatus, but no clear vegetation. The tricuspid valve is normal in structure. Tricuspid valve regurgitation is trivial. No evidence of tricuspid stenosis. There is no evidence of tricuspid valve vegetation. Aortic Valve: The aortic valve is tricuspid. Aortic valve regurgitation is not visualized. No aortic stenosis is present. There is no evidence of aortic valve vegetation. Pulmonic Valve: The pulmonic valve was grossly normal. Pulmonic valve regurgitation is not visualized. Aorta: The aortic root and ascending aorta are structurally normal, with no evidence of dilitation. IAS/Shunts: No atrial level shunt detected by color flow Doppler. Jodelle Red MD Electronically signed by Jodelle Red MD Signature Date/Time: 07/31/2020/6:01:53 PM    Final    VAS [place] UPPER EXTREMITY VENOUS DUPLEX  Result Date: 07/25/2020 UPPER VENOUS STUDY  Indications: Swelling, and Edema Risk Factors: IV  Drug Use. Performing Technologist: Jannet Askew RCT RDMS  Examination Guidelines: A complete evaluation includes B-mode imaging, spectral Doppler, color Doppler, and power Doppler as needed of all accessible portions of each vessel. Bilateral testing is considered an  integral part of a complete examination. Limited examinations for reoccurring indications may be performed as noted.  Right Findings: +----------+------------+---------+-----------+----------+-------+ RIGHT     CompressiblePhasicitySpontaneousPropertiesSummary +----------+------------+---------+-----------+----------+-------+ IJV           Full       Yes       Yes                      +----------+------------+---------+-----------+----------+-------+ Subclavian    Full       Yes       Yes                      +----------+------------+---------+-----------+----------+-------+ Axillary      Full       Yes       Yes                      +----------+------------+---------+-----------+----------+-------+ Brachial      Full       Yes       Yes                      +----------+------------+---------+-----------+----------+-------+ Radial        Full                                          +----------+------------+---------+-----------+----------+-------+ Ulnar         Full                                          +----------+------------+---------+-----------+----------+-------+ Cephalic      Full                                          +----------+------------+---------+-----------+----------+-------+ Basilic       Full                                          +----------+------------+---------+-----------+----------+-------+  Left Findings: +----------+------------+---------+-----------+----------+-------+ LEFT      CompressiblePhasicitySpontaneousPropertiesSummary +----------+------------+---------+-----------+----------+-------+ IJV           Full       Yes       Yes                      +----------+------------+---------+-----------+----------+-------+ Subclavian    Full       Yes       Yes                      +----------+------------+---------+-----------+----------+-------+  Summary:  Right: No evidence of deep vein thrombosis in  the upper extremity. No evidence of superficial vein thrombosis in the upper extremity.  Left: No evidence of thrombosis in the subclavian.  *See table(s) above for measurements and observations.  Diagnosing physician: Sherald Hess MD Electronically signed by Sherald Hess MD on 07/25/2020  at 4:42:55 PM.    Final    [place] EKG SITE RITE  Result Date: 07/29/2020 If Site Rite image not attached, placement could not be confirmed due to current cardiac rhythm.    Subjective: Patient seen and examined bedside, resting comfortably side of bed.  Eating breakfast.  Ready for discharge home.  Patient requesting refill of his home medications as he does not have a outpatient PCP appointment till 08/20/2020.  No other questions or concerns at this time.  Denies headache, no fever/chills/night sweats, no nausea/vomiting/diarrhea, no chest pain, palpitations, no shortness of breath, no abdominal pain, no weakness, no fatigue, no paresthesias.  No acute events overnight per nursing staff.  Discharge Exam: Vitals:   08/02/20 0300 08/02/20 0827  BP: 129/63 128/72  Pulse: 73 84  Resp: 17 17  Temp: 98.4 F (36.9 C) 98.2 F (36.8 C)  SpO2: 98% 99%   Vitals:   08/01/20 1845 08/01/20 1859 08/02/20 0300 08/02/20 0827  BP: 136/84 128/73 129/63 128/72  Pulse: 76 70 73 84  Resp: 16 15 17 17   Temp: 98.2 F (36.8 C) 98.7 F (37.1 C) 98.4 F (36.9 C) 98.2 F (36.8 C)  TempSrc: Oral Oral Oral Oral  SpO2: 98% 97% 98% 99%  Weight:      Height:        General: Pt is alert, awake, not in acute distress, obese Cardiovascular: RRR, S1/S2 +, no rubs, no gallops Respiratory: CTA bilaterally, no wheezing, no rhonchi, oxygenating well on room air Abdominal: Soft, NT, ND, bowel sounds + Extremities: no edema, no cyanosis Integumentary: To left anterior upper extremity nodules with surrounding erythema, decreased in size since admission, right lower quadrant cellulitis without drainage, improved in  size    The results of significant diagnostics from this hospitalization (including imaging, microbiology, ancillary and laboratory) are listed below for reference.     Microbiology: Recent Results (from the past 240 hour(s))  Culture, blood (routine x 2)     Status: Abnormal   Collection Time: 07/24/20  6:37 PM   Specimen: BLOOD RIGHT ARM  Result Value Ref Range Status   Specimen Description   Final    BLOOD RIGHT ARM Performed at [facility], [address] [place], [place]    Special Requests   Final    BOTTLES DRAWN AEROBIC AND ANAEROBIC Blood Culture adequate volume Performed at [facility], [address] [place], [place]    Culture  Setup Time   Final    GRAM POSITIVE COCCI IN CLUSTERS ANAEROBIC BOTTLE ONLY CRITICAL RESULT CALLED TO, READ BACK BY AND VERIFIED [id number] [facility] 2051 07/25/20 A BROWNING Performed at [facility], [address] [place], [place]    Culture STAPHYLOCOCCUS AUREUS (A)  Final   Report Status 07/27/2020 FINAL  Final   Organism ID, Bacteria STAPHYLOCOCCUS AUREUS  Final      Susceptibility   Staphylococcus aureus - MIC*    CIPROFLOXACIN <=0.5 SENSITIVE Sensitive     ERYTHROMYCIN <=0.25 SENSITIVE Sensitive     GENTAMICIN <=0.5 SENSITIVE Sensitive     OXACILLIN 0.5 SENSITIVE Sensitive     TETRACYCLINE <=1 SENSITIVE Sensitive     VANCOMYCIN 1 SENSITIVE Sensitive     TRIMETH/SULFA <=10 SENSITIVE Sensitive     CLINDAMYCIN <=0.25 SENSITIVE Sensitive     RIFAMPIN <=0.5 SENSITIVE Sensitive     Inducible Clindamycin NEGATIVE Sensitive     * STAPHYLOCOCCUS AUREUS  Blood Culture ID Panel (Reflexed)     Status: Abnormal   Collection Time: 07/24/20  6:37 PM  Result Value Ref Range Status   Enterococcus faecalis NOT DETECTED NOT DETECTED Final   Enterococcus Faecium NOT DETECTED NOT DETECTED Final   Listeria monocytogenes NOT DETECTED NOT DETECTED Final   Staphylococcus species  DETECTED (A) NOT DETECTED Final    Comment: CRITICAL RESULT CALLED TO, READ BACK BY AND VERIFIED WITH: H VON [facility] PHARMD 2051 07/25/20 A BROWNING    Staphylococcus aureus (BCID) DETECTED (A) NOT DETECTED Final    Comment: CRITICAL RESULT CALLED TO, READ BACK BY AND VERIFIED WITH: H VON [facility] PHARMD 2051 07/25/20 A BROWNING    Staphylococcus epidermidis NOT DETECTED NOT DETECTED Final   Staphylococcus lugdunensis NOT DETECTED NOT DETECTED Final   Streptococcus species NOT DETECTED NOT DETECTED Final   Streptococcus agalactiae NOT DETECTED NOT DETECTED Final   Streptococcus pneumoniae NOT DETECTED NOT DETECTED Final   Streptococcus pyogenes NOT DETECTED NOT DETECTED Final   A.calcoaceticus-baumannii NOT DETECTED NOT DETECTED Final   Bacteroides fragilis NOT DETECTED NOT DETECTED Final   Enterobacterales NOT DETECTED NOT DETECTED Final   Enterobacter cloacae complex NOT DETECTED NOT DETECTED Final   Escherichia coli NOT DETECTED NOT DETECTED Final   Klebsiella aerogenes NOT DETECTED NOT DETECTED Final   Klebsiella oxytoca NOT DETECTED NOT DETECTED Final   Klebsiella pneumoniae NOT DETECTED NOT DETECTED Final   Proteus species NOT DETECTED NOT DETECTED Final   Salmonella species NOT DETECTED NOT DETECTED Final   Serratia marcescens NOT DETECTED NOT DETECTED Final   Haemophilus influenzae NOT DETECTED NOT DETECTED Final   Neisseria meningitidis NOT DETECTED NOT DETECTED Final   Pseudomonas aeruginosa NOT DETECTED NOT DETECTED Final   Stenotrophomonas maltophilia NOT DETECTED NOT DETECTED Final   Candida albicans NOT DETECTED NOT DETECTED Final   Candida auris NOT DETECTED NOT DETECTED Final   Candida glabrata NOT DETECTED NOT DETECTED Final   Candida krusei NOT DETECTED NOT DETECTED Final   Candida parapsilosis NOT DETECTED NOT DETECTED Final   Candida tropicalis NOT DETECTED NOT DETECTED Final   Cryptococcus neoformans/gattii NOT DETECTED NOT DETECTED Final   Meth resistant mecA/C  and MREJ NOT DETECTED NOT DETECTED Final    Comment: Performed at [facility], [address] [place], [place] [address]  SARS Coronavirus 2 by RT PCR (hospital order, performed in [facility] hospital lab) Nasopharyngeal Peripheral     Status: None   Collection Time: 07/24/20  7:28 PM   Specimen: Peripheral; Nasopharyngeal  Result Value Ref Range Status   SARS Coronavirus 2 NEGATIVE NEGATIVE Final    Comment: (NOTE) SARS-CoV-2 target nucleic acids are NOT DETECTED.  The SARS-CoV-2 RNA is generally detectable in upper and lower respiratory specimens during the acute phase of infection. The lowest concentration of SARS-CoV-2 viral copies this assay can detect is 250 copies / mL. A negative result does not preclude SARS-CoV-2 infection and should not be used as the sole basis for treatment or other patient management decisions.  A negative result may occur with improper specimen collection / handling, submission of specimen other than nasopharyngeal swab, presence of viral mutation(s) within the areas targeted by this assay, and inadequate number of viral copies (<250 copies / mL). A negative result must be combined with clinical observations, patient history, and epidemiological information.  Fact Sheet for Patients:   BoilerBrush.com.cy  Fact Sheet for Healthcare Providers: https://pope.com/  This test is not yet approved or  cleared by  the [employer] and has been authorized for detection and/or diagnosis of SARS-CoV-2 by FDA under an Emergency Use Authorization (EUA).  This EUA will remain in effect (meaning this test can be used) for the duration of the COVID-19 declaration under Section 564(b)(1) of the Act, 21 U.S.C. section 360bbb-3(b)(1), unless the authorization is terminated or revoked sooner.  Performed at [facility], [address] [place], [place] [address]   MRSA PCR Screening     Status:  Abnormal   Collection Time: 07/25/20  4:21 AM   Specimen: Nasopharyngeal  Result Value Ref Range Status   MRSA by PCR POSITIVE (A) NEGATIVE Final    Comment:        The GeneXpert MRSA Assay (FDA approved for NASAL specimens only), is one component of a comprehensive MRSA colonization surveillance program. It is not intended to diagnose MRSA infection nor to guide or monitor treatment for MRSA infections. RESULT CALLED TO, READ BACK BY AND VERIFIED WITH: RN Jasmine December [facility] 6045 [phone] FCP Performed at [facility], [address], [place], [place] [address]   Culture, blood (Routine X 2) w Reflex to ID Panel     Status: None   Collection Time: 07/26/20  5:34 PM   Specimen: BLOOD LEFT ARM  Result Value Ref Range Status   Specimen Description BLOOD LEFT ARM  Final   Special Requests   Final    BOTTLES DRAWN AEROBIC AND ANAEROBIC Blood Culture adequate volume   Culture   Final    NO GROWTH 5 DAYS Performed at [facility], [address] [place], [place] [address]    Report Status 07/31/2020 FINAL  Final  Culture, blood (Routine X 2) w Reflex to ID Panel     Status: Abnormal   Collection Time: 07/26/20  5:46 PM   Specimen: BLOOD LEFT HAND  Result Value Ref Range Status   Specimen Description BLOOD LEFT HAND  Final   Special Requests   Final    BOTTLES DRAWN AEROBIC ONLY Blood Culture adequate volume   Culture  Setup Time   Final    GRAM POSITIVE COCCI IN CLUSTERS GRAM POSITIVE COCCI IN CHAINS AEROBIC BOTTLE ONLY Organism ID to follow CRITICAL RESULT CALLED TO, READ BACK BY AND VERIFIED WITH: G. ABBOTT,PHARMD 0405 07/28/2020 T. TYSOR    Culture (A)  Final    STAPHYLOCOCCUS CAPITIS STREPTOCOCCUS PARASANGUINIS THE SIGNIFICANCE OF ISOLATING THIS ORGANISM FROM A SINGLE SET OF BLOOD CULTURES WHEN MULTIPLE SETS ARE DRAWN IS UNCERTAIN. PLEASE NOTIFY THE MICROBIOLOGY DEPARTMENT WITHIN ONE WEEK IF SPECIATION AND SENSITIVITIES ARE REQUIRED. Performed at [facility], [address], [place], [place] [address]    Report Status 08/01/2020 FINAL  Final  Blood Culture ID Panel (Reflexed)     Status: Abnormal   Collection Time: 07/26/20  5:46 PM  Result Value Ref Range Status   Enterococcus faecalis NOT DETECTED NOT DETECTED Final   Enterococcus Faecium NOT DETECTED NOT DETECTED Final   Listeria monocytogenes NOT DETECTED NOT DETECTED Final   Staphylococcus species DETECTED (A) NOT DETECTED Final    Comment: CRITICAL RESULT CALLED TO, READ BACK BY AND VERIFIED WITH: G. ABBOTT,PHARMD 0405 07/28/2020 T. TYSOR    Staphylococcus aureus (BCID) NOT DETECTED NOT DETECTED Final   Staphylococcus epidermidis NOT DETECTED NOT DETECTED Final   Staphylococcus lugdunensis NOT DETECTED NOT DETECTED Final   Streptococcus species DETECTED (A) NOT DETECTED Final    Comment: Not Enterococcus species, Streptococcus agalactiae,  Streptococcus pyogenes, or Streptococcus pneumoniae. CRITICAL RESULT CALLED TO, READ BACK BY AND VERIFIED WITH: G. ABBOTT,PHARMD 0405 07/28/2020 T. TYSOR    Streptococcus agalactiae NOT DETECTED NOT DETECTED Final   Streptococcus pneumoniae NOT DETECTED NOT DETECTED Final   Streptococcus pyogenes NOT DETECTED NOT DETECTED Final   A.calcoaceticus-baumannii NOT DETECTED NOT DETECTED Final   Bacteroides fragilis NOT DETECTED NOT DETECTED Final   Enterobacterales NOT DETECTED NOT DETECTED Final   Enterobacter cloacae complex NOT DETECTED NOT DETECTED Final   Escherichia coli NOT DETECTED NOT DETECTED Final   Klebsiella aerogenes NOT DETECTED NOT DETECTED Final   Klebsiella oxytoca NOT DETECTED NOT DETECTED Final   Klebsiella pneumoniae NOT DETECTED NOT DETECTED Final   Proteus species NOT DETECTED NOT DETECTED Final   Salmonella species NOT DETECTED NOT DETECTED Final   Serratia marcescens NOT DETECTED NOT DETECTED Final   Haemophilus influenzae NOT DETECTED NOT DETECTED Final   Neisseria meningitidis NOT DETECTED NOT DETECTED Final    Pseudomonas aeruginosa NOT DETECTED NOT DETECTED Final   Stenotrophomonas maltophilia NOT DETECTED NOT DETECTED Final   Candida albicans NOT DETECTED NOT DETECTED Final   Candida auris NOT DETECTED NOT DETECTED Final   Candida glabrata NOT DETECTED NOT DETECTED Final   Candida krusei NOT DETECTED NOT DETECTED Final   Candida parapsilosis NOT DETECTED NOT DETECTED Final   Candida tropicalis NOT DETECTED NOT DETECTED Final   Cryptococcus neoformans/gattii NOT DETECTED NOT DETECTED Final    Comment: Performed at [facility], [address] [place], [place] [address]  Culture, blood (Routine X 2) w Reflex to ID Panel     Status: None   Collection Time: 07/27/20 11:10 AM   Specimen: BLOOD RIGHT ARM  Result Value Ref Range Status   Specimen Description BLOOD RIGHT ARM  Final   Special Requests   Final    BOTTLES DRAWN AEROBIC AND ANAEROBIC Blood Culture adequate volume   Culture   Final    NO GROWTH 5 DAYS Performed at [facility], [address], [place], [place] [address]    Report Status 08/01/2020 FINAL  Final     Labs: BNP (last 3 results) No results for input(s): BNP in the last 8760 hours. Basic Metabolic Panel: Recent Labs  Lab 07/29/20 0538 07/30/20 0436  NA 137 137  K 4.3 4.0  CL 103 103  CO2 25 25  GLUCOSE 94 95  BUN 15 17  CREATININE 0.94 0.91  CALCIUM 9.3 9.2   Liver Function Tests: No results for input(s): AST, ALT, ALKPHOS, BILITOT, PROT, ALBUMIN in the last 168 hours. No results for input(s): LIPASE, AMYLASE in the last 168 hours. No results for input(s): AMMONIA in the last 168 hours. CBC: Recent Labs  Lab 07/29/20 0538 07/30/20 0436  WBC 6.4 6.0  HGB 11.0* 10.9*  HCT 35.2* 35.3*  MCV 77.9* 76.4*  PLT 363 342   Cardiac Enzymes: No results for input(s): CKTOTAL, CKMB, CKMBINDEX, TROPONINI in the last 168 hours. BNP: Invalid input(s): POCBNP CBG: No results for input(s): GLUCAP in the last 168 hours. D-Dimer No results for  input(s): DDIMER in the last 72 hours. Hgb A1c No results for input(s): HGBA1C in the last 72 hours. Lipid Profile No results for input(s): CHOL, HDL, LDLCALC, TRIG, CHOLHDL, LDLDIRECT in the last 72 hours. Thyroid function studies No results for input(s): TSH, T4TOTAL, T3FREE, THYROIDAB in the last 72 hours.  Invalid input(s): FREET3 Anemia work up No results for input(s): VITAMINB12, FOLATE, FERRITIN, TIBC, IRON,  RETICCTPCT in the last 72 hours. Urinalysis    Component Value Date/Time   COLORURINE YELLOW 07/24/2020 1928   APPEARANCEUR CLOUDY (A) 07/24/2020 1928   LABSPEC >1.030 (H) 07/24/2020 1928   PHURINE 6.0 07/24/2020 1928   GLUCOSEU NEGATIVE 07/24/2020 1928   HGBUR NEGATIVE 07/24/2020 1928   BILIRUBINUR SMALL (A) 07/24/2020 1928   KETONESUR NEGATIVE 07/24/2020 1928   PROTEINUR 30 (A) 07/24/2020 1928   NITRITE NEGATIVE 07/24/2020 1928   LEUKOCYTESUR NEGATIVE 07/24/2020 1928   Sepsis Labs Invalid input(s): PROCALCITONIN,  WBC,  LACTICIDVEN Microbiology Recent Results (from the past 240 hour(s))  Culture, blood (routine x 2)     Status: Abnormal   Collection Time: 07/24/20  6:37 PM   Specimen: BLOOD RIGHT ARM  Result Value Ref Range Status   Specimen Description   Final    BLOOD RIGHT ARM Performed at [facility], [address] [place], [place] [address]    Special Requests   Final    BOTTLES DRAWN AEROBIC AND ANAEROBIC Blood Culture adequate volume Performed at [facility], [address] [place], [place] [address]    Culture  Setup Time   Final    GRAM POSITIVE COCCI IN CLUSTERS ANAEROBIC BOTTLE ONLY CRITICAL RESULT CALLED TO, READ BACK BY AND VERIFIED WITHCandiss Norse [facility] 2051 07/25/20 A BROWNING Performed at [facility], [address] [place], [place] [address]    Culture STAPHYLOCOCCUS AUREUS (A)  Final   Report Status 07/27/2020 FINAL  Final   Organism ID, Bacteria STAPHYLOCOCCUS AUREUS  Final      Susceptibility    Staphylococcus aureus - MIC*    CIPROFLOXACIN <=0.5 SENSITIVE Sensitive     ERYTHROMYCIN <=0.25 SENSITIVE Sensitive     GENTAMICIN <=0.5 SENSITIVE Sensitive     OXACILLIN 0.5 SENSITIVE Sensitive     TETRACYCLINE <=1 SENSITIVE Sensitive     VANCOMYCIN 1 SENSITIVE Sensitive     TRIMETH/SULFA <=10 SENSITIVE Sensitive     CLINDAMYCIN <=0.25 SENSITIVE Sensitive     RIFAMPIN <=0.5 SENSITIVE Sensitive     Inducible Clindamycin NEGATIVE Sensitive     * STAPHYLOCOCCUS AUREUS  Blood Culture ID Panel (Reflexed)     Status: Abnormal   Collection Time: 07/24/20  6:37 PM  Result Value Ref Range Status   Enterococcus faecalis NOT DETECTED NOT DETECTED Final   Enterococcus Faecium NOT DETECTED NOT DETECTED Final   Listeria monocytogenes NOT DETECTED NOT DETECTED Final   Staphylococcus species DETECTED (A) NOT DETECTED Final    Comment: CRITICAL RESULT CALLED TO, READ BACK BY AND VERIFIED WITH: H VON [facility] PHARMD 2051 07/25/20 A BROWNING    Staphylococcus aureus (BCID) DETECTED (A) NOT DETECTED Final    Comment: CRITICAL RESULT CALLED TO, READ BACK BY AND VERIFIED WITH: H VON [facility] PHARMD 2051 07/25/20 A BROWNING    Staphylococcus epidermidis NOT DETECTED NOT DETECTED Final   Staphylococcus lugdunensis NOT DETECTED NOT DETECTED Final   Streptococcus species NOT DETECTED NOT DETECTED Final   Streptococcus agalactiae NOT DETECTED NOT DETECTED Final   Streptococcus pneumoniae NOT DETECTED NOT DETECTED Final   Streptococcus pyogenes NOT DETECTED NOT DETECTED Final   A.calcoaceticus-baumannii NOT DETECTED NOT DETECTED Final   Bacteroides fragilis NOT DETECTED NOT DETECTED Final   Enterobacterales NOT DETECTED NOT DETECTED Final   Enterobacter cloacae complex NOT DETECTED NOT DETECTED Final   Escherichia coli NOT DETECTED NOT DETECTED Final   Klebsiella aerogenes NOT DETECTED NOT DETECTED Final   Klebsiella  oxytoca NOT DETECTED NOT DETECTED Final   Klebsiella pneumoniae NOT DETECTED NOT DETECTED  Final   Proteus species NOT DETECTED NOT DETECTED Final   Salmonella species NOT DETECTED NOT DETECTED Final   Serratia marcescens NOT DETECTED NOT DETECTED Final   Haemophilus influenzae NOT DETECTED NOT DETECTED Final   Neisseria meningitidis NOT DETECTED NOT DETECTED Final   Pseudomonas aeruginosa NOT DETECTED NOT DETECTED Final   Stenotrophomonas maltophilia NOT DETECTED NOT DETECTED Final   Candida albicans NOT DETECTED NOT DETECTED Final   Candida auris NOT DETECTED NOT DETECTED Final   Candida glabrata NOT DETECTED NOT DETECTED Final   Candida krusei NOT DETECTED NOT DETECTED Final   Candida parapsilosis NOT DETECTED NOT DETECTED Final   Candida tropicalis NOT DETECTED NOT DETECTED Final   Cryptococcus neoformans/gattii NOT DETECTED NOT DETECTED Final   Meth resistant mecA/C and MREJ NOT DETECTED NOT DETECTED Final    Comment: Performed at [facility], [address] [place], [place] [address]  SARS Coronavirus 2 by RT PCR (hospital order, performed in [facility] hospital lab) Nasopharyngeal Peripheral     Status: None   Collection Time: 07/24/20  7:28 PM   Specimen: Peripheral; Nasopharyngeal  Result Value Ref Range Status   SARS Coronavirus 2 NEGATIVE NEGATIVE Final    Comment: (NOTE) SARS-CoV-2 target nucleic acids are NOT DETECTED.  The SARS-CoV-2 RNA is generally detectable in upper and lower respiratory specimens during the acute phase of infection. The lowest concentration of SARS-CoV-2 viral copies this assay can detect is 250 copies / mL. A negative result does not preclude SARS-CoV-2 infection and should not be used as the sole basis for treatment or other patient management decisions.  A negative result may occur with improper specimen collection / handling, submission of specimen other than nasopharyngeal swab, presence of viral mutation(s) within the areas targeted by this assay, and inadequate number of viral copies (<250 copies / mL). A negative result  must be combined with clinical observations, patient history, and epidemiological information.  Fact Sheet for Patients:   BoilerBrush.com.cy  Fact Sheet for Healthcare Providers: https://pope.com/  This test is not yet approved or  cleared by the [place] FDA and has been authorized for detection and/or diagnosis of SARS-CoV-2 by FDA under an Emergency Use Authorization (EUA).  This EUA will remain in effect (meaning this test can be used) for the duration of the COVID-19 declaration under Section 564(b)(1) of the Act, 21 U.S.C. section 360bbb-3(b)(1), unless the authorization is terminated or revoked sooner.  Performed at [facility], [address] [place], [place] [address]   MRSA PCR Screening     Status: Abnormal   Collection Time: 07/25/20  4:21 AM   Specimen: Nasopharyngeal  Result Value Ref Range Status   MRSA by PCR POSITIVE (A) NEGATIVE Final    Comment:        The GeneXpert MRSA Assay (FDA approved for NASAL specimens only), is one component of a comprehensive MRSA colonization surveillance program. It is not intended to diagnose MRSA infection nor to guide or monitor treatment for MRSA infections. RESULT CALLED TO, READ BACK BY AND VERIFIED WITH: RN Jasmine December [facility] 0981 [phone] FCP Performed at [facility], [address] [place], [place] [address]   Culture, blood (Routine X 2) w Reflex to ID Panel     Status: None   Collection Time: 07/26/20  5:34 PM   Specimen: BLOOD LEFT ARM  Result Value Ref Range Status  Specimen Description BLOOD LEFT ARM  Final   Special Requests   Final    BOTTLES DRAWN AEROBIC AND ANAEROBIC Blood Culture adequate volume   Culture   Final    NO GROWTH 5 DAYS Performed at [facility], [address], [place], [place] [address]    Report Status 07/31/2020 FINAL  Final  Culture, blood (Routine X 2) w Reflex to ID Panel     Status: Abnormal    Collection Time: 07/26/20  5:46 PM   Specimen: BLOOD LEFT HAND  Result Value Ref Range Status   Specimen Description BLOOD LEFT HAND  Final   Special Requests   Final    BOTTLES DRAWN AEROBIC ONLY Blood Culture adequate volume   Culture  Setup Time   Final    GRAM POSITIVE COCCI IN CLUSTERS GRAM POSITIVE COCCI IN CHAINS AEROBIC BOTTLE ONLY Organism ID to follow CRITICAL RESULT CALLED TO, READ BACK BY AND VERIFIED WITH: G. ABBOTT,PHARMD [id number] 07/28/2020 T. TYSOR    Culture (A)  Final    STAPHYLOCOCCUS CAPITIS STREPTOCOCCUS PARASANGUINIS THE SIGNIFICANCE OF ISOLATING THIS ORGANISM FROM A SINGLE SET OF BLOOD CULTURES WHEN MULTIPLE SETS ARE DRAWN IS UNCERTAIN. PLEASE NOTIFY THE MICROBIOLOGY DEPARTMENT WITHIN ONE WEEK IF SPECIATION AND SENSITIVITIES ARE REQUIRED. Performed at [facility], [address] [place], [place] [address]    Report Status 08/01/2020 FINAL  Final  Blood Culture ID Panel (Reflexed)     Status: Abnormal   Collection Time: 07/26/20  5:46 PM  Result Value Ref Range Status   Enterococcus faecalis NOT DETECTED NOT DETECTED Final   Enterococcus Faecium NOT DETECTED NOT DETECTED Final   Listeria monocytogenes NOT DETECTED NOT DETECTED Final   Staphylococcus species DETECTED (A) NOT DETECTED Final    Comment: CRITICAL RESULT CALLED TO, READ BACK BY AND VERIFIED WITH: G. ABBOTT,PHARMD 0405 07/28/2020 T. TYSOR    Staphylococcus aureus (BCID) NOT DETECTED NOT DETECTED Final   Staphylococcus epidermidis NOT DETECTED NOT DETECTED Final   Staphylococcus lugdunensis NOT DETECTED NOT DETECTED Final   Streptococcus species DETECTED (A) NOT DETECTED Final    Comment: Not Enterococcus species, Streptococcus agalactiae, Streptococcus pyogenes, or Streptococcus pneumoniae. CRITICAL RESULT CALLED TO, READ BACK BY AND VERIFIED WITH: G. ABBOTT,PHARMD 0405 07/28/2020 T. TYSOR    Streptococcus agalactiae NOT DETECTED NOT DETECTED Final   Streptococcus pneumoniae NOT DETECTED NOT  DETECTED Final   Streptococcus pyogenes NOT DETECTED NOT DETECTED Final   A.calcoaceticus-baumannii NOT DETECTED NOT DETECTED Final   Bacteroides fragilis NOT DETECTED NOT DETECTED Final   Enterobacterales NOT DETECTED NOT DETECTED Final   Enterobacter cloacae complex NOT DETECTED NOT DETECTED Final   Escherichia coli NOT DETECTED NOT DETECTED Final   Klebsiella aerogenes NOT DETECTED NOT DETECTED Final   Klebsiella oxytoca NOT DETECTED NOT DETECTED Final   Klebsiella pneumoniae NOT DETECTED NOT DETECTED Final   Proteus species NOT DETECTED NOT DETECTED Final   Salmonella species NOT DETECTED NOT DETECTED Final   Serratia marcescens NOT DETECTED NOT DETECTED Final   Haemophilus influenzae NOT DETECTED NOT DETECTED Final   Neisseria meningitidis NOT DETECTED NOT DETECTED Final   Pseudomonas aeruginosa NOT DETECTED NOT DETECTED Final   Stenotrophomonas maltophilia NOT DETECTED NOT DETECTED Final   Candida albicans NOT DETECTED NOT DETECTED Final   Candida auris NOT DETECTED NOT DETECTED Final   Candida glabrata NOT DETECTED NOT DETECTED Final   Candida krusei NOT DETECTED NOT DETECTED Final   Candida parapsilosis NOT DETECTED NOT DETECTED Final  Candida tropicalis NOT DETECTED NOT DETECTED Final   Cryptococcus neoformans/gattii NOT DETECTED NOT DETECTED Final    Comment: Performed at [facility], [address], [place], [place] [address]  Culture, blood (Routine X 2) w Reflex to ID Panel     Status: None   Collection Time: 07/27/20 11:10 AM   Specimen: BLOOD RIGHT ARM  Result Value Ref Range Status   Specimen Description BLOOD RIGHT ARM  Final   Special Requests   Final    BOTTLES DRAWN AEROBIC AND ANAEROBIC Blood Culture adequate volume   Culture   Final    NO GROWTH 5 DAYS Performed at [facility], [address], [place], [place] [address]    Report Status 08/01/2020 FINAL  Final     Time coordinating discharge: Over 30 minutes  SIGNED:   Alvira Philips [place],  DO  [facility] 08/02/2020, 9:12 AM

## 2020-08-02 NOTE — Care Management (Signed)
Spoke with patient regarding filling prescriptions. He states he is enrolled in a charity program at [facility] where he can fill all prescriptions for free.  He is able to drive there to get them.   Patient states he has list of resources for substance abuse and will follow up .

## 2020-08-05 ENCOUNTER — Encounter (HOSPITAL_COMMUNITY): Payer: Self-pay | Admitting: Pharmacy Technician

## 2020-08-14 ENCOUNTER — Ambulatory Visit: Payer: Self-pay | Admitting: Orthopaedic Surgery

## 2020-08-22 ENCOUNTER — Ambulatory Visit: Payer: Self-pay | Admitting: Orthopaedic Surgery

## 2020-08-27 ENCOUNTER — Ambulatory Visit (INDEPENDENT_AMBULATORY_CARE_PROVIDER_SITE_OTHER): Payer: Self-pay

## 2020-08-27 ENCOUNTER — Ambulatory Visit: Payer: Self-pay

## 2020-08-27 ENCOUNTER — Encounter: Payer: Self-pay | Admitting: Orthopaedic Surgery

## 2020-08-27 ENCOUNTER — Ambulatory Visit (INDEPENDENT_AMBULATORY_CARE_PROVIDER_SITE_OTHER): Payer: Self-pay | Admitting: Orthopaedic Surgery

## 2020-08-27 DIAGNOSIS — G8929 Other chronic pain: Secondary | ICD-10-CM

## 2020-08-27 DIAGNOSIS — M25551 Pain in right hip: Secondary | ICD-10-CM

## 2020-08-27 DIAGNOSIS — M25511 Pain in right shoulder: Secondary | ICD-10-CM

## 2020-08-27 DIAGNOSIS — M545 Low back pain, unspecified: Secondary | ICD-10-CM

## 2020-08-27 DIAGNOSIS — M25562 Pain in left knee: Secondary | ICD-10-CM

## 2020-08-27 DIAGNOSIS — M160 Bilateral primary osteoarthritis of hip: Secondary | ICD-10-CM

## 2020-08-27 MED ORDER — LIDOCAINE HCL 1 % IJ SOLN
2.0000 mL | INTRAMUSCULAR | Status: AC | PRN
  Administered 2020-08-27: 2 mL

## 2020-08-27 MED ORDER — BUPIVACAINE HCL 0.25 % IJ SOLN
2.0000 mL | INTRAMUSCULAR | Status: AC | PRN
  Administered 2020-08-27: 2 mL via INTRA_ARTICULAR

## 2020-08-27 MED ORDER — METHYLPREDNISOLONE ACETATE 40 MG/ML IJ SUSP
40.0000 mg | INTRAMUSCULAR | Status: AC | PRN
  Administered 2020-08-27: 40 mg via INTRA_ARTICULAR

## 2020-08-27 NOTE — Progress Notes (Signed)
Subjective: Patient is here for ultrasound-guided intra-articular right hip injection.     Objective:  Pain with IR.  Has tinea cruris.  Procedure: Ultrasound-guided right hip injection: After sterile prep with Betadine, injected 8 cc 1% lidocaine without epinephrine and 40 mg methylprednisolone using a 22-gauge spinal needle, passing the needle through the iliofemoral ligament into the femoral head/neck junction.  Difficult needle visualization due to size and depth.
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
## 2021-03-27 ENCOUNTER — Emergency Department (HOSPITAL_BASED_OUTPATIENT_CLINIC_OR_DEPARTMENT_OTHER)
Admission: EM | Admit: 2021-03-27 | Discharge: 2021-03-28 | Disposition: A | Discharge status: Home / Self Care | Payer: Self-pay | Attending: Emergency Medicine | Admitting: Emergency Medicine

## 2021-03-27 ENCOUNTER — Other Ambulatory Visit: Payer: Self-pay

## 2021-03-27 ENCOUNTER — Encounter (HOSPITAL_BASED_OUTPATIENT_CLINIC_OR_DEPARTMENT_OTHER): Payer: Self-pay | Admitting: Emergency Medicine

## 2021-03-27 DIAGNOSIS — Z79899 Other long term (current) drug therapy: Secondary | ICD-10-CM | POA: Insufficient documentation

## 2021-03-27 DIAGNOSIS — F17210 Nicotine dependence, cigarettes, uncomplicated: Secondary | ICD-10-CM | POA: Insufficient documentation

## 2021-03-27 DIAGNOSIS — I10 Essential (primary) hypertension: Secondary | ICD-10-CM | POA: Insufficient documentation

## 2021-03-27 DIAGNOSIS — M79652 Pain in left thigh: Secondary | ICD-10-CM | POA: Insufficient documentation

## 2021-03-27 DIAGNOSIS — L03113 Cellulitis of right upper limb: Secondary | ICD-10-CM | POA: Insufficient documentation

## 2021-03-27 HISTORY — DX: Morbid (severe) obesity due to excess calories: E66.01

## 2021-03-27 LAB — COMPREHENSIVE METABOLIC PANEL
ALT: 59 U/L — ABNORMAL HIGH (ref 0–44)
AST: 42 U/L — ABNORMAL HIGH (ref 15–41)
Albumin: 3.7 g/dL (ref 3.5–5.0)
Alkaline Phosphatase: 139 U/L — ABNORMAL HIGH (ref 38–126)
Anion gap: 10 (ref 5–15)
BUN: 13 mg/dL (ref 6–20)
CO2: 25 mmol/L (ref 22–32)
Calcium: 8.9 mg/dL (ref 8.9–10.3)
Chloride: 98 mmol/L (ref 98–111)
Creatinine, Ser: 0.81 mg/dL (ref 0.61–1.24)
GFR, Estimated: >60 mL/min (ref >60)
Glucose, Bld: 154 mg/dL — ABNORMAL HIGH (ref 70–99)
Potassium: 3.9 mmol/L (ref 3.5–5.1)
Sodium: 133 mmol/L — ABNORMAL LOW (ref 135–145)
Total Bilirubin: 1.1 mg/dL (ref 0.3–1.2)
Total Protein: 8 g/dL (ref 6.5–8.1)

## 2021-03-27 LAB — CBC WITH DIFFERENTIAL/PLATELET
Abs Immature Granulocytes: 0.17 10*3/uL — ABNORMAL HIGH (ref 0.00–0.07)
Basophils Absolute: 0.1 10*3/uL (ref 0.0–0.1)
Basophils Relative: 1 %
Eosinophils Absolute: 0 10*3/uL (ref 0.0–0.5)
Eosinophils Relative: 0 %
HCT: 34.3 % — ABNORMAL LOW (ref 39.0–52.0)
Hemoglobin: 11.1 g/dL — ABNORMAL LOW (ref 13.0–17.0)
Immature Granulocytes: 1 %
Lymphocytes Relative: 7 %
Lymphs Abs: 1 10*3/uL (ref 0.7–4.0)
MCH: 25.8 pg — ABNORMAL LOW (ref 26.0–34.0)
MCHC: 32.4 g/dL (ref 30.0–36.0)
MCV: 79.6 fL — ABNORMAL LOW (ref 80.0–100.0)
Monocytes Absolute: 1.8 10*3/uL — ABNORMAL HIGH (ref 0.1–1.0)
Monocytes Relative: 14 %
Neutro Abs: 10.2 10*3/uL — ABNORMAL HIGH (ref 1.7–7.7)
Neutrophils Relative %: 77 %
Platelets: 283 10*3/uL (ref 150–400)
RBC: 4.31 MIL/uL (ref 4.22–5.81)
RDW: 15.9 % — ABNORMAL HIGH (ref 11.5–15.5)
WBC: 13.2 10*3/uL — ABNORMAL HIGH (ref 4.0–10.5)
nRBC: 0 % (ref 0.0–0.2)

## 2021-03-27 LAB — LACTIC ACID, PLASMA: Lactic Acid, Venous: 2 mmol/L (ref 0.5–1.9)

## 2021-03-27 MED ORDER — SODIUM CHLORIDE 0.9 % IV BOLUS
1000.0000 mL | Freq: Once | INTRAVENOUS | Status: AC
  Administered 2021-03-27: 1000 mL via INTRAVENOUS

## 2021-03-27 MED ORDER — DOXYCYCLINE HYCLATE 100 MG PO TABS
100.0000 mg | ORAL_TABLET | Freq: Once | ORAL | Status: AC
  Administered 2021-03-27: 100 mg via ORAL
  Filled 2021-03-27: qty 1

## 2021-03-27 MED ORDER — CYCLOBENZAPRINE HCL 10 MG PO TABS
10.0000 mg | ORAL_TABLET | Freq: Once | ORAL | Status: AC
  Administered 2021-03-27: 10 mg via ORAL
  Filled 2021-03-27: qty 1

## 2021-03-27 MED ORDER — HYDROMORPHONE HCL 1 MG/ML IJ SOLN
1.0000 mg | Freq: Once | INTRAMUSCULAR | Status: AC
Start: 2021-03-28 — End: 2021-03-27
  Administered 2021-03-27: 1 mg via INTRAVENOUS
  Filled 2021-03-27: qty 1

## 2021-03-27 MED ORDER — KETOROLAC TROMETHAMINE 30 MG/ML IJ SOLN
30.0000 mg | Freq: Once | INTRAMUSCULAR | Status: AC
  Administered 2021-03-27: 30 mg via INTRAMUSCULAR
  Filled 2021-03-27: qty 1

## 2021-03-27 MED ORDER — LORAZEPAM 2 MG/ML IJ SOLN
1.0000 mg | Freq: Once | INTRAMUSCULAR | Status: AC
  Administered 2021-03-27: 1 mg via INTRAVENOUS
  Filled 2021-03-27: qty 1

## 2021-03-27 MED ORDER — ONDANSETRON HCL 4 MG/2ML IJ SOLN
4.0000 mg | Freq: Once | INTRAMUSCULAR | Status: AC
  Administered 2021-03-27: 4 mg via INTRAVENOUS
  Filled 2021-03-27: qty 2

## 2021-03-27 NOTE — ED Notes (Signed)
Pt asked to turn onto back for IV attempt, patient states he cannot move. Approx 15 minutes of attempting, patient able to roll onto back.

## 2021-03-27 NOTE — ED Notes (Addendum)
Assisted patient with moving up in bed and sitting up some. Patient states he can barely move. No acute distress noted. Pt requested to have his backpack in the bed with him.

## 2021-03-27 NOTE — ED Triage Notes (Signed)
Pt c/o muscle spasm in left leg. Pt states he is out of his gabapentin and flexeril

## 2021-03-27 NOTE — ED Notes (Signed)
Pt was at [facility] ED a few hours ago and eloped from there after becoming verbally abusive to staff and demanding narcotics.

## 2021-03-27 NOTE — ED Notes (Signed)
Pt rolled from EMS stretcher to ED stretcher. Laying in bed on stomach. Reporting left upper leg pain/muscle spasms. Refusing to move leg when asked. Pt moaning and groaning during assessment.

## 2021-03-27 NOTE — ED Notes (Signed)
Patient states his IV got pulled out when he sat up on his own.
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
## 2021-03-28 MED ORDER — DOXYCYCLINE HYCLATE 100 MG PO CAPS: 100.0000 mg | ORAL_CAPSULE | Freq: Two times a day (BID) | ORAL | 0 refills | Status: AC

## 2021-03-28 NOTE — ED Notes (Signed)
Pt ambulated with walker to wheelchair out of room. No acute distress noted.

## 2021-03-28 NOTE — Discharge Instructions (Signed)
Please read and follow all provided instructions.  Your diagnoses today include:  1. Left thigh pain   2. Cellulitis of right forearm     Tests performed today include:  Vital signs. See below for your results today.  Blood cell counts (white, red, and platelets) - slightly high white blood cell count Electrolytes  Kidney function test Blood cultures - pending  Medications prescribed:   Doxycycline - antibiotic  You have been prescribed an antibiotic medicine: take the entire course of medicine even if you are feeling better. Stopping early can cause the antibiotic not to work.  Take any prescribed medications only as directed.   Home care instructions:  Follow any educational materials contained in this packet. Keep affected area above the level of your heart when possible. Wash area gently twice a day with warm soapy water. Do not apply alcohol or hydrogen peroxide. Cover the area if it draining or weeping.   Follow-up instructions: Follow-up with your doctor next week for recheck of your symptoms and infection of your arm.  Return instructions:  Return to the Emergency Department if you have:  Fever  Worsening symptoms  Worsening pain  Worsening swelling  Redness of the skin that moves away from the affected area, especially if it streaks away from the affected area   Any other emergent concerns  Your vital signs today were: BP (!) 145/84 (BP Location: Left Arm)   Pulse 96   Temp 98.7 F (37.1 C) (Oral)   Resp 18   Ht 5\' 6"  (1.676 m)   Wt (!) 144 kg   SpO2 96%   BMI 51.24 kg/m  If your blood pressure (BP) was elevated above 135/85 this visit, please have this repeated by your doctor within one month. --------------

## 2021-03-28 NOTE — ED Notes (Signed)
Pt with backpack still in bed. Noted to be mumbling and dozing off during conversation. Pt laying still in bed with eyes closed.

## 2021-03-28 NOTE — ED Notes (Signed)
Pt noted to be trying to get up out of bed. Stated he wanted to wash up because he got urine on himself. Provided patient with a walker and assisted patient to sink. No acute distress noted.

## 2021-03-29 ENCOUNTER — Telehealth: Payer: Self-pay | Admitting: Infectious Disease

## 2021-03-29 ENCOUNTER — Telehealth (HOSPITAL_BASED_OUTPATIENT_CLINIC_OR_DEPARTMENT_OTHER): Payer: Self-pay | Admitting: Emergency Medicine

## 2021-03-29 LAB — BLOOD CULTURE ID PANEL (REFLEXED) - BCID2

## 2021-03-29 NOTE — Telephone Encounter (Signed)
Left VM with patient that he has Staph infection in bloodstream and needs to come back to ER for admission

## 2021-03-31 LAB — CULTURE, BLOOD (ROUTINE X 2)
Special Requests: ADEQUATE
Special Requests: ADEQUATE

## 2021-06-30 ENCOUNTER — Other Ambulatory Visit (HOSPITAL_BASED_OUTPATIENT_CLINIC_OR_DEPARTMENT_OTHER): Payer: Self-pay

## 2022-08-02 NOTE — Telephone Encounter (Signed)
error 

## 2023-05-29 ENCOUNTER — Emergency Department (HOSPITAL_BASED_OUTPATIENT_CLINIC_OR_DEPARTMENT_OTHER): Payer: 59

## 2023-05-29 ENCOUNTER — Other Ambulatory Visit: Payer: Self-pay

## 2023-05-29 ENCOUNTER — Encounter (HOSPITAL_BASED_OUTPATIENT_CLINIC_OR_DEPARTMENT_OTHER): Payer: Self-pay

## 2023-05-29 ENCOUNTER — Emergency Department (HOSPITAL_BASED_OUTPATIENT_CLINIC_OR_DEPARTMENT_OTHER)
Admission: EM | Admit: 2023-05-29 | Discharge: 2023-05-30 | Disposition: A | Discharge status: Home / Self Care | Payer: 59 | Attending: Emergency Medicine | Admitting: Emergency Medicine

## 2023-05-29 DIAGNOSIS — R0602 Shortness of breath: Secondary | ICD-10-CM | POA: Diagnosis not present

## 2023-05-29 DIAGNOSIS — I10 Essential (primary) hypertension: Secondary | ICD-10-CM | POA: Insufficient documentation

## 2023-05-29 DIAGNOSIS — R600 Localized edema: Secondary | ICD-10-CM | POA: Diagnosis not present

## 2023-05-29 DIAGNOSIS — Z79899 Other long term (current) drug therapy: Secondary | ICD-10-CM | POA: Insufficient documentation

## 2023-05-29 DIAGNOSIS — M7989 Other specified soft tissue disorders: Secondary | ICD-10-CM | POA: Diagnosis present

## 2023-05-29 LAB — BASIC METABOLIC PANEL
Anion gap: 10 (ref 5–15)
BUN: 22 mg/dL — ABNORMAL HIGH (ref 6–20)
CO2: 26 mmol/L (ref 22–32)
Calcium: 8.6 mg/dL — ABNORMAL LOW (ref 8.9–10.3)
Chloride: 100 mmol/L (ref 98–111)
Creatinine, Ser: 1.13 mg/dL (ref 0.61–1.24)
GFR, Estimated: >60 mL/min (ref >60)
Glucose, Bld: 125 mg/dL — ABNORMAL HIGH (ref 70–99)
Potassium: 3.9 mmol/L (ref 3.5–5.1)
Sodium: 136 mmol/L (ref 135–145)

## 2023-05-29 LAB — CBC
HCT: 34.3 % — ABNORMAL LOW (ref 39.0–52.0)
Hemoglobin: 10.9 g/dL — ABNORMAL LOW (ref 13.0–17.0)
MCH: 24.7 pg — ABNORMAL LOW (ref 26.0–34.0)
MCHC: 31.8 g/dL (ref 30.0–36.0)
MCV: 77.8 fL — ABNORMAL LOW (ref 80.0–100.0)
Platelets: 250 10*3/uL (ref 150–400)
RBC: 4.41 MIL/uL (ref 4.22–5.81)
RDW: 17 % — ABNORMAL HIGH (ref 11.5–15.5)
WBC: 6.5 10*3/uL (ref 4.0–10.5)
nRBC: 0 % (ref 0.0–0.2)

## 2023-05-29 LAB — BRAIN NATRIURETIC PEPTIDE: B Natriuretic Peptide: 16 pg/mL (ref 0.0–100.0)

## 2023-05-29 LAB — URINALYSIS, ROUTINE W REFLEX MICROSCOPIC
Bilirubin Urine: NEGATIVE
Glucose, UA: NEGATIVE mg/dL
Hgb urine dipstick: NEGATIVE
Ketones, ur: NEGATIVE mg/dL
Leukocytes,Ua: NEGATIVE
Nitrite: NEGATIVE
Protein, ur: NEGATIVE mg/dL
Specific Gravity, Urine: 1.025 (ref 1.005–1.030)
pH: 7 (ref 5.0–8.0)

## 2023-05-29 LAB — TROPONIN I (HIGH SENSITIVITY): Troponin I (High Sensitivity): 3 ng/L (ref <18)

## 2023-05-29 MED ORDER — FUROSEMIDE 20 MG PO TABS
20.0000 mg | ORAL_TABLET | Freq: Once | ORAL | Status: AC
  Administered 2023-05-29: 20 mg via ORAL
  Filled 2023-05-29: qty 1

## 2023-05-29 MED ORDER — FUROSEMIDE 20 MG PO TABS: 20.0000 mg | ORAL_TABLET | Freq: Every day | ORAL | 0 refills | Status: DC

## 2023-05-29 NOTE — ED Triage Notes (Signed)
Pt arrives with c/o bilateral LLE that started 2 weeks ago. Pt endorses SOB. Pt denies CP.

## 2023-05-29 NOTE — ED Provider Notes (Signed)
[place] [facility] AT [facility] Provider Note   CSN: [id number] Arrival date & time: 05/29/23  2021     History  Chief Complaint  Patient presents with   Leg Swelling    Joshua Jacobs is a 47 y.o. male with medical history of MSSA bacteremia, anxiety, chronic hip pain, DJD, hemorrhoids, hypertension, IV drug abuse, morbid obesity, opiate abuse, polysubstance abuse, sweating abnormality.  Patient presents to ED for evaluation of bilateral leg and bilateral hand swelling.  The patient reports that 24 days ago he developed swelling to his bilateral hands.  The patient reports that about 10 days ago he also developed swelling to his bilateral feet.  Patient states that he was seen at outside hospital for this on 7/4.  The patient unremarkable workup and was discharged home with diagnosis of fluid dependency due to venous insufficiency.  The patient states that ever since discharge, he has had persistent and worsening leg swelling.  He reports that at outside hospital they gave him Lasix and did nothing else and discharged him home.  He is also reporting that he is feeling short of breath and is felt this way for the last 1 week.  He reports that his shortness of breath is worse when he is lying flat at night.  He denies any chest pain, nausea, vomiting, lightheadedness, dizziness, weakness.  Denies history of DVT, PE.  Denies exogenous hormone use.  Patient reports that ever since having osteomyelitis of left hip he has been primarily dependent on a walker to ambulate.  The patient reports that he is still utilizing a walker to ambulate but states that he is only able to take about 4 steps before he needs to sit down.  HPI     Home Medications Prior to Admission medications   Medication Sig Start Date End Date Taking? Authorizing Provider  furosemide (LASIX) 20 MG tablet Take 1 tablet (20 mg total) by mouth daily. 05/29/23  Yes Al Decant, PA-C  acetaminophen  (TYLENOL) 500 MG tablet Take 1,000 mg by mouth as needed for moderate pain or headache.     [provider]  amLODipine (NORVASC) 5 MG tablet Take 1 tablet (5 mg total) by mouth daily. 08/02/20 09/01/20  [place], Alvira Philips, DO  Buprenorphine HCl-Naloxone HCl 8-2 MG FILM Place 1 Film under the tongue in the morning and at bedtime.  05/09/20   [provider]  diclofenac Sodium (VOLTAREN) 1 % GEL Apply 2 g topically as needed (pain).  03/28/20   [provider]  doxycycline (VIBRAMYCIN) 100 MG capsule Take 1 capsule (100 mg total) by mouth 2 (two) times daily. 03/28/21   Renne Crigler, PA-C  FLUoxetine (PROZAC) 20 MG capsule Take 1 capsule (20 mg total) by mouth daily. 08/02/20 09/01/20  [place], Alvira Philips, DO  gabapentin (NEURONTIN) 300 MG capsule Take 3 capsules (900 mg total) by mouth 2 (two) times daily. 08/02/20 09/01/20  [place], Alvira Philips, DO  gabapentin (NEURONTIN) 600 MG tablet Take 2 tablets (1,200 mg total) by mouth at bedtime. 08/02/20 09/01/20  [place], Alvira Philips, DO  hydrochlorothiazide (HYDRODIURIL) 12.5 MG tablet Take 1 tablet (12.5 mg total) by mouth daily. 08/02/20 09/01/20  [place], Eric J, DO  LORazepam (ATIVAN) 1 MG tablet Take 1 tablet (1 mg total) by mouth at bedtime. Patient taking differently: Take 1 mg by mouth 3 (three) times daily.  08/29/17   Carlis Stable, PA-C  nicotine (NICODERM CQ - DOSED IN MG/24 HOURS) 21 mg/24hr  patch Place 21 mg onto the skin daily. 05/09/20   [provider]  pantoprazole (PROTONIX) 40 MG tablet Take 1 tablet (40 mg total) by mouth daily. 08/02/20 09/01/20  [place], Eric J, DO  POLY-IRON 150 150 MG capsule Take 150 mg by mouth daily. 05/09/20   [provider]  SM NICOTINE POLACRILEX 2 MG gum Take 2 mg by mouth as needed for smoking cessation.  03/28/20   [provider]      Allergies    Patient has no known allergies.    Review of Systems   Review of Systems  Constitutional:  Negative for chills  and fever.  Respiratory:  Positive for shortness of breath.   Cardiovascular:  Positive for leg swelling. Negative for chest pain.  Musculoskeletal:  Negative for myalgias.  All other systems reviewed and are negative.   Physical Exam Updated Vital Signs BP (!) 112/55 (BP Location: Right Wrist)   Pulse 85   Temp 98.7 F (37.1 C) (Oral)   Resp 18   Wt (!) 163.3 kg   SpO2 96%   BMI 58.11 kg/m  Physical Exam Vitals and nursing note reviewed.  Constitutional:      General: He is not in acute distress.    Appearance: Normal appearance. He is obese. He is not ill-appearing, toxic-appearing or diaphoretic.  HENT:     Head: Normocephalic and atraumatic.     Nose: Nose normal.     Mouth/Throat:     Mouth: Mucous membranes are moist.     Pharynx: Oropharynx is clear.  Eyes:     Extraocular Movements: Extraocular movements intact.     Conjunctiva/sclera: Conjunctivae normal.     Pupils: Pupils are equal, round, and reactive to light.  Cardiovascular:     Rate and Rhythm: Normal rate and regular rhythm.  Pulmonary:     Effort: Pulmonary effort is normal.     Breath sounds: Normal breath sounds. No wheezing.  Abdominal:     General: Abdomen is flat. Bowel sounds are normal.     Palpations: Abdomen is soft.     Tenderness: There is no abdominal tenderness.  Musculoskeletal:     Cervical back: Normal range of motion and neck supple. No tenderness.     Right lower leg: Edema present.     Left lower leg: Edema present.  Skin:    General: Skin is warm and dry.     Capillary Refill: Capillary refill takes less than 2 seconds.  Neurological:     Mental Status: He is alert and oriented to person, place, and time.     ED Results / Procedures / Treatments   Labs (all labs ordered are listed, but only abnormal results are displayed) Labs Reviewed  BASIC METABOLIC PANEL - Abnormal; Notable for the following components:      Result Value   Glucose, Bld 125 (*)    BUN 22 (*)     Calcium 8.6 (*)    All other components within normal limits  CBC - Abnormal; Notable for the following components:   Hemoglobin 10.9 (*)    HCT 34.3 (*)    MCV 77.8 (*)    MCH 24.7 (*)    RDW 17.0 (*)    All other components within normal limits  BRAIN NATRIURETIC PEPTIDE  URINALYSIS, ROUTINE W REFLEX MICROSCOPIC  TROPONIN I (HIGH SENSITIVITY)    EKG None  Radiology DG Chest 2 View  Result Date: 05/29/2023 CLINICAL DATA:  Dyspnea EXAM: CHEST -  2 VIEW COMPARISON:  07/24/2020 FINDINGS: The heart size and mediastinal contours are within normal limits. Both lungs are clear. The visualized skeletal structures are unremarkable. IMPRESSION: No active cardiopulmonary disease. Electronically Signed   By: Helyn Numbers M.D.   On: 05/29/2023 21:21    Procedures Procedures   Medications Ordered in ED Medications  furosemide (LASIX) tablet 20 mg (20 mg Oral Given 05/29/23 2250)    ED Course/ Medical Decision Making/ A&P Clinical Course as of 05/29/23 2340  Sun May 29, 2023  2306 3 days 20mg  lasix, follow up with PCP, ambulatory referral to cards for repeat ECHO [CG]    Clinical Course User Index [CG] Al Decant, PA-C    Medical Decision Making Amount and/or Complexity of Data Reviewed Labs: ordered. Radiology: ordered.  Risk Prescription drug management.   47 year old male presents to the ED for evaluation.  Please see HPI for further details.  On examination the patient is afebrile and nontachycardic.  His lung sounds are clear bilaterally and he is not hypoxic on room air.  Abdomen is soft compressible throughout.  Neurological examination at baseline.  Patient does have 1+ nonpitting edema to bilateral lower extremities.  His hands do appear swollen as well.  He is overall nontoxic in appearance.  Patient CBC shows no leukocytosis, hemoglobin of 10.9.  The patient metabolic panel shows no electrolyte derangement, no elevated creatinine, anion gap of 10.  The  patient BNP is not elevated at 16 however the patient is morbidly obese so this is probably not accurate.  Patient troponin is 3, this was ordered in triage and he denies any chest pain send no delta will be collected.  His EKG is nonischemic.  Patient is pending a urinalysis at this time to rule out nephrotic syndrome.  Patient given initial dose of Lasix 20 mg and states that he is urinating after this is given.  Patient did begin complaining of right femur pain during examination so I added on a portable femur x-ray which was not resulted yet.  At this time, after discussion with my attending Dr. Jacqulyn Bath, we have decided to place an ambulatory referral to cardiology.  The patient is most likely in need of repeat echo however he does not appear to be hypoxic here and there seems to be no reason to admit him so this can be done in the outpatient setting.  Will send him home with 3 days of Lasix and have him follow-up with his PCP for recheck of labs.  He was advised to return to the ED with any new or worsening signs or symptoms.  He was also advised to purchase compression stockings to assist with his lower extremity swelling.    At the end my shift the patient workup is not complete.  He is pending x-ray of his femur as well as urinalysis.  He will be signed out to oncoming attending Dr. Judd Lien.  Patient amenable to plan.   Final Clinical Impression(s) / ED Diagnoses Final diagnoses:  Bilateral lower extremity edema  Shortness of breath    Rx / DC Orders ED Discharge Orders          Ordered    Ambulatory referral to Cardiology        05/29/23 2334    furosemide (LASIX) 20 MG tablet  Daily        05/29/23 2340              Al Decant, [place] 05/29/23 2340  Maia Plan, MD 06/01/23 1128

## 2023-05-29 NOTE — Discharge Instructions (Addendum)
It was a pleasure taking part in your care today.  As we discussed, there is no obvious reason that we are able to identify for your leg swelling.  We believe that you are in need of a repeat echocardiogram.  We have placed an ambulatory referral to cardiology so they should be calling you in the next couple of days to set this up.  If they do not call you, please call them utilizing the number above.  I would also like for you to purchase compression stockings to assist with your bilateral lower extremity swelling.  I am also sending you home with 3 days of Lasix.  This medication will help you urinate some of this fluid off.  I would like for you to have your labs rechecked in the next couple of days with your PCP to ensure that your electrolytes are not out of range.

## 2023-05-30 MED ORDER — FUROSEMIDE 20 MG PO TABS: 40.0000 mg | ORAL_TABLET | Freq: Every day | ORAL | 0 refills | Status: AC

## 2023-07-15 NOTE — Progress Notes (Deleted)
Referring-Christopher Groce PA-C Reason for referral-dyspnea  HPI: 47 year old male for evaluation of dyspnea at request of Delice Bison PA-C.  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  Transesophageal echocardiogram September 2021 showed normal LV function and no clear vegetation.  Echocardiogram at [facility] December 2022 showed normal LV function, moderate left ventricular hypertrophy, grade 1 diastolic dysfunction, dilated aortic root at 4.1 cm.  Chest x-ray July 2024 showed no active cardiopulmonary disease.  BNP July 2024 16.  Seen recently with dyspnea and edema and cardiology asked to evaluate.  Current Outpatient Medications  Medication Sig Dispense Refill   acetaminophen (TYLENOL) 500 MG tablet Take 1,000 mg by mouth as needed for moderate pain or headache.      amLODipine (NORVASC) 5 MG tablet Take 1 tablet (5 mg total) by mouth daily. 30 tablet 0   Buprenorphine HCl-Naloxone HCl 8-2 MG FILM Place 1 Film under the tongue in the morning and at bedtime.      diclofenac Sodium (VOLTAREN) 1 % GEL Apply 2 g topically as needed (pain).      doxycycline (VIBRAMYCIN) 100 MG capsule Take 1 capsule (100 mg total) by mouth 2 (two) times daily. 14 capsule 0   FLUoxetine (PROZAC) 20 MG capsule Take 1 capsule (20 mg total) by mouth daily. 30 capsule 0   furosemide (LASIX) 20 MG tablet Take 2 tablets (40 mg total) by mouth daily. 5 tablet 0   gabapentin (NEURONTIN) 300 MG capsule Take 3 capsules (900 mg total) by mouth 2 (two) times daily. 180 capsule 0   gabapentin (NEURONTIN) 600 MG tablet Take 2 tablets (1,200 mg total) by mouth at bedtime. 60 tablet 0   hydrochlorothiazide (HYDRODIURIL) 12.5 MG tablet Take 1 tablet (12.5 mg total) by mouth daily. 30 tablet 0   LORazepam (ATIVAN) 1 MG tablet Take 1 tablet (1 mg total) by mouth at bedtime. (Patient taking differently: Take 1 mg by mouth 3 (three) times daily. ) 20 tablet 0    nicotine (NICODERM CQ - DOSED IN MG/24 HOURS) 21 mg/24hr patch Place 21 mg onto the skin daily.     pantoprazole (PROTONIX) 40 MG tablet Take 1 tablet (40 mg total) by mouth daily. 30 tablet 0   POLY-IRON 150 150 MG capsule Take 150 mg by mouth daily.     SM NICOTINE POLACRILEX 2 MG gum Take 2 mg by mouth as needed for smoking cessation.      No current facility-administered medications for this visit.    No Known Allergies   Past Medical History:  Diagnosis Date   Anxiety    Chronic hip pain    Degenerative joint disease    Degenerative joint disease of left hip    Hemorrhoids    History of MSSA bacteremia 08/2017   Hypertension    IV drug abuse (HCC)    Morbid obesity (HCC)    Opioid abuse (HCC)    Polysubstance abuse (HCC)    PTSD (post-traumatic stress disorder)    Sweating abnormality     Past Surgical History:  Procedure Laterality Date   TEE WITHOUT CARDIOVERSION N/A 08/18/2017   Procedure: TRANSESOPHAGEAL ECHOCARDIOGRAM (TEE);  Surgeon: Lewayne Bunting, MD;  Location: [facility] ENDOSCOPY;  Service: Cardiovascular;  Laterality: N/A;   TEE WITHOUT CARDIOVERSION  08/2017   TEE WITHOUT CARDIOVERSION N/A 07/31/2020   Procedure: TRANSESOPHAGEAL ECHOCARDIOGRAM (TEE);  Surgeon: Jodelle Red, MD;  Location: [facility] ENDOSCOPY;  Service: Cardiovascular;  Laterality: N/A;    Social History   Socioeconomic History   Marital status: Single    Spouse name: Not on file   Number of children: Not on file   Years of education: Not on file   Highest education level: Not on file  Occupational History   Occupation: Advice worker  Tobacco Use   Smoking status: Every Day    Current packs/day: 0.00    Average packs/day: 1 pack/day for 21.0 years (21.0 ttl pk-yrs)    Types: Cigarettes    Start date: 08/19/1996    Last attempt to quit: 08/19/2017    Years since quitting: 5.9   Smokeless tobacco: Never  Vaping Use   Vaping status: Never Used  Substance and Sexual  Activity   Alcohol use: No   Drug use: Yes    Types: Heroin, Cocaine, Methaqualone, IV    Comment: heroin   Sexual activity: Yes  Other Topics Concern   Not on file  Social History Narrative   ** Merged History Encounter **       Social Determinants of Health   Financial Resource Strain: Not on file  Food Insecurity: High Risk (03/04/2023)   Received from [facility]   Food vital sign    Within the past 12 months, you worried that your food would run out before you got money to buy more: Often true    Within the past 12 months, the food you bought just didn't last and you didn't have money to get more. : Often true  Transportation Needs: Not on file (03/04/2023)  Physical Activity: Not on file  Stress: Not on file  Social Connections: Unknown (04/27/2023)   Received from [facility]   Social Network    Social Network: Not on file  Intimate Partner Violence: Unknown (04/27/2023)   Received from [facility]   HITS    Physically Hurt: Not on file    Insult or Talk Down To: Not on file    Threaten Physical Harm: Not on file    Scream or Curse: Not on file    Family History  Problem Relation Age of Onset   Hypertension Mother    Hypertension Father    Lung cancer Maternal Uncle     ROS: no fevers or chills, productive cough, hemoptysis, dysphasia, odynophagia, melena, hematochezia, dysuria, hematuria, rash, seizure activity, orthopnea, PND, pedal edema, claudication. Remaining systems are negative.  Physical Exam:   There were no vitals taken for this visit.  General:  Well developed/well nourished in NAD Skin warm/dry Patient not depressed No peripheral clubbing Back-normal HEENT-normal/normal eyelids Neck supple/normal carotid upstroke bilaterally; no bruits; no JVD; no thyromegaly chest - CTA/ normal expansion CV - RRR/normal S1 and S2; no murmurs, rubs or gallops;  PMI nondisplaced Abdomen -NT/ND, no HSM, no mass, + bowel sounds, no bruit 2+ femoral pulses,  no bruits Ext-no edema, chords, 2+ DP Neuro-grossly nonfocal  ECG - personally reviewed  A/P  1 dyspnea-  2 lower extremity edema-  3 hypertension-  4 history of IV drug abuse-  Olga Millers, MD

## 2023-07-25 ENCOUNTER — Ambulatory Visit: Payer: 59 | Admitting: Cardiology

## 2024-03-03 ENCOUNTER — Encounter (HOSPITAL_BASED_OUTPATIENT_CLINIC_OR_DEPARTMENT_OTHER): Payer: Self-pay | Admitting: Emergency Medicine

## 2024-03-03 ENCOUNTER — Emergency Department (HOSPITAL_BASED_OUTPATIENT_CLINIC_OR_DEPARTMENT_OTHER)

## 2024-03-03 ENCOUNTER — Other Ambulatory Visit: Payer: Self-pay

## 2024-03-03 ENCOUNTER — Emergency Department (HOSPITAL_BASED_OUTPATIENT_CLINIC_OR_DEPARTMENT_OTHER)
Admission: EM | Admit: 2024-03-03 | Discharge: 2024-03-04 | Disposition: A | Discharge status: Other Acute Inpatient Hospital | Attending: Emergency Medicine | Admitting: Emergency Medicine

## 2024-03-03 DIAGNOSIS — D72829 Elevated white blood cell count, unspecified: Secondary | ICD-10-CM | POA: Diagnosis not present

## 2024-03-03 DIAGNOSIS — L03113 Cellulitis of right upper limb: Secondary | ICD-10-CM

## 2024-03-03 DIAGNOSIS — R825 Elevated urine levels of drugs, medicaments and biological substances: Secondary | ICD-10-CM | POA: Diagnosis not present

## 2024-03-03 DIAGNOSIS — L03114 Cellulitis of left upper limb: Secondary | ICD-10-CM | POA: Insufficient documentation

## 2024-03-03 DIAGNOSIS — D649 Anemia, unspecified: Secondary | ICD-10-CM | POA: Insufficient documentation

## 2024-03-03 DIAGNOSIS — A419 Sepsis, unspecified organism: Secondary | ICD-10-CM | POA: Insufficient documentation

## 2024-03-03 DIAGNOSIS — Z79899 Other long term (current) drug therapy: Secondary | ICD-10-CM | POA: Insufficient documentation

## 2024-03-03 DIAGNOSIS — M25551 Pain in right hip: Secondary | ICD-10-CM | POA: Insufficient documentation

## 2024-03-03 DIAGNOSIS — R791 Abnormal coagulation profile: Secondary | ICD-10-CM | POA: Diagnosis not present

## 2024-03-03 DIAGNOSIS — F1990 Other psychoactive substance use, unspecified, uncomplicated: Secondary | ICD-10-CM

## 2024-03-03 LAB — CBC WITH DIFFERENTIAL/PLATELET
Abs Immature Granulocytes: 0.23 10*3/uL — ABNORMAL HIGH (ref 0.00–0.07)
Basophils Absolute: 0.1 10*3/uL (ref 0.0–0.1)
Basophils Relative: 0 %
Eosinophils Absolute: 0 10*3/uL (ref 0.0–0.5)
Eosinophils Relative: 0 %
HCT: 37.4 % — ABNORMAL LOW (ref 39.0–52.0)
Hemoglobin: 11.9 g/dL — ABNORMAL LOW (ref 13.0–17.0)
Immature Granulocytes: 1 %
Lymphocytes Relative: 11 %
Lymphs Abs: 1.7 10*3/uL (ref 0.7–4.0)
MCH: 25 pg — ABNORMAL LOW (ref 26.0–34.0)
MCHC: 31.8 g/dL (ref 30.0–36.0)
MCV: 78.6 fL — ABNORMAL LOW (ref 80.0–100.0)
Monocytes Absolute: 2 10*3/uL — ABNORMAL HIGH (ref 0.1–1.0)
Monocytes Relative: 12 %
Neutro Abs: 12.4 10*3/uL — ABNORMAL HIGH (ref 1.7–7.7)
Neutrophils Relative %: 76 %
Platelets: 327 10*3/uL (ref 150–400)
RBC: 4.76 MIL/uL (ref 4.22–5.81)
RDW: 15.1 % (ref 11.5–15.5)
WBC: 16.4 10*3/uL — ABNORMAL HIGH (ref 4.0–10.5)
nRBC: 0 % (ref 0.0–0.2)

## 2024-03-03 LAB — COMPREHENSIVE METABOLIC PANEL WITH GFR
ALT: 24 U/L (ref 0–44)
AST: 24 U/L (ref 15–41)
Albumin: 3.8 g/dL (ref 3.5–5.0)
Alkaline Phosphatase: 129 U/L — ABNORMAL HIGH (ref 38–126)
Anion gap: 11 (ref 5–15)
BUN: 16 mg/dL (ref 6–20)
CO2: 27 mmol/L (ref 22–32)
Calcium: 9.2 mg/dL (ref 8.9–10.3)
Chloride: 95 mmol/L — ABNORMAL LOW (ref 98–111)
Creatinine, Ser: 0.94 mg/dL (ref 0.61–1.24)
GFR, Estimated: >60 mL/min (ref >60)
Glucose, Bld: 97 mg/dL (ref 70–99)
Potassium: 3.5 mmol/L (ref 3.5–5.1)
Sodium: 133 mmol/L — ABNORMAL LOW (ref 135–145)
Total Bilirubin: 1.5 mg/dL — ABNORMAL HIGH (ref 0.0–1.2)
Total Protein: 9.1 g/dL — ABNORMAL HIGH (ref 6.5–8.1)

## 2024-03-03 LAB — URINALYSIS, W/ REFLEX TO CULTURE (INFECTION SUSPECTED)
Bilirubin Urine: NEGATIVE
Glucose, UA: NEGATIVE mg/dL
Hgb urine dipstick: NEGATIVE
Ketones, ur: 15 mg/dL — AB
Leukocytes,Ua: NEGATIVE
Nitrite: NEGATIVE
Protein, ur: 100 mg/dL — AB
Specific Gravity, Urine: 1.02 (ref 1.005–1.030)
pH: 8 (ref 5.0–8.0)

## 2024-03-03 LAB — RAPID URINE DRUG SCREEN, HOSP PERFORMED
Amphetamines: NOT DETECTED
Barbiturates: NOT DETECTED
Benzodiazepines: NOT DETECTED
Cocaine: POSITIVE — AB
Opiates: POSITIVE — AB
Tetrahydrocannabinol: POSITIVE — AB

## 2024-03-03 LAB — PROTIME-INR
INR: 1.2 (ref 0.8–1.2)
Prothrombin Time: 15.5 s — ABNORMAL HIGH (ref 11.4–15.2)

## 2024-03-03 LAB — LACTIC ACID, PLASMA: Lactic Acid, Venous: 1.7 mmol/L (ref 0.5–1.9)

## 2024-03-03 LAB — RESP PANEL BY RT-PCR (RSV, FLU A&B, COVID)  RVPGX2
Influenza A by PCR: NEGATIVE
Influenza B by PCR: NEGATIVE
Resp Syncytial Virus by PCR: NEGATIVE
SARS Coronavirus 2 by RT PCR: NEGATIVE

## 2024-03-03 MED ORDER — ACETAMINOPHEN 500 MG PO TABS
1000.0000 mg | ORAL_TABLET | Freq: Once | ORAL | Status: AC
  Administered 2024-03-03: 1000 mg via ORAL
  Filled 2024-03-03: qty 2

## 2024-03-03 MED ORDER — LACTATED RINGERS IV BOLUS
1000.0000 mL | Freq: Once | INTRAVENOUS | Status: AC
  Administered 2024-03-03: 1000 mL via INTRAVENOUS

## 2024-03-03 MED ORDER — VANCOMYCIN HCL IN DEXTROSE 1-5 GM/200ML-% IV SOLN
1000.0000 mg | Freq: Once | INTRAVENOUS | Status: AC
  Administered 2024-03-03: 1000 mg via INTRAVENOUS
  Filled 2024-03-03: qty 200

## 2024-03-03 MED ORDER — SODIUM CHLORIDE 0.9 % IV SOLN
2.0000 g | Freq: Once | INTRAVENOUS | Status: AC
  Administered 2024-03-03: 2 g via INTRAVENOUS
  Filled 2024-03-03: qty 12.5

## 2024-03-03 MED ORDER — IOHEXOL 300 MG/ML  SOLN
100.0000 mL | Freq: Once | INTRAMUSCULAR | Status: AC | PRN
  Administered 2024-03-03: 100 mL via INTRAVENOUS

## 2024-03-03 MED ORDER — LACTATED RINGERS IV SOLN: INTRAVENOUS | Status: DC

## 2024-03-03 NOTE — ED Provider Notes (Signed)
  Physical Exam  BP (!) 150/79   Pulse 89   Temp (!) 101 F (38.3 C) (Oral)   Resp 13   SpO2 93%   Physical Exam  Procedures  .Critical Care  Performed by: Rosealee Concha, MD Authorized by: Rosealee Concha, MD   Critical care provider statement:    Critical care time (minutes):  30   Critical care was necessary to treat or prevent imminent or life-threatening deterioration of the following conditions:  Sepsis   Critical care was time spent personally by me on the following activities:  Development of treatment plan with patient or surrogate, discussions with consultants, evaluation of patient's response to treatment, examination of patient, ordering and review of laboratory studies, ordering and review of radiographic studies, ordering and performing treatments and interventions, pulse oximetry, re-evaluation of patient's condition and review of old charts   Care discussed with: accepting provider at another facility     ED Course / MDM    Medical Decision Making Amount and/or Complexity of Data Reviewed Labs: ordered. Radiology: ordered.  Risk OTC drugs. Prescription drug management.   52M presenting with hip pain, hx of IVDA and septic arthritis. Febrile on arrival, HR 92, leukocytosis to 16. Was on outpatient clindamycin  for left hip osteomyelitis.  Waiting on CT imaging. Has a left forearm cellulitis, there is a retained foreign body seen on XR. Pt still uses IV drugs. Getting IV ABX. Needs [facility] admission as his care is there once CT results.    CT Right Hip:  IMPRESSION:  1. Markedly advanced right hip osteoarthritis, with a small right  hip effusion and loose bodies. Infection in the joint is difficult  to exclude in this case, but no aggressive bone destruction is seen.  2. No evidence of fracture or dislocation.  3. Partial atrophy of the gluteal musculature.  4. Atherosclerosis.   Engaged [facility] physician transfer line: Dr. Geralyn Knee Amrhein  accepted the patient in admission.     Rosealee Concha, MD 03/04/24 [phone]

## 2024-03-03 NOTE — ED Provider Notes (Signed)
 [name] [facility] Provider Note   CSN: [id number] Arrival date & time: 03/03/24  2151     History  Chief Complaint  Patient presents with   Leg Pain    Joshua Jacobs Jacobs is a 48 y.o. male.   Leg Pain   48 year old male presents emergency department with complaints of right hip pain.  States he has had pain for the past 2 days.  Denies any known trauma/falls.  States he has a history of septic arthritis of the left having to have his femoral head removed and feels like this is the exact same but on the right.  Patient with history of IV drug use and reports continued intravenous drug use with most recent heroin dose yesterday.  Denies any fever but patient febrile upon presentation.  Does report associated nausea.  Denies any chest pain, shortness of breath, cough, abdominal pain, urinary symptoms.  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 Authorizing Provider  acetaminophen  (TYLENOL ) 500 MG tablet Take 1,000 mg by mouth as needed for moderate pain or headache.     [provider]  amLODipine  (NORVASC ) 5 MG tablet Take 1 tablet (5 mg total) by mouth daily. 08/02/20 09/01/20  [place], Joshua J, DO  Buprenorphine  HCl-Naloxone  HCl 8-2 MG FILM Place 1 Film under the tongue in the morning and at bedtime.  05/09/20   [provider]  diclofenac  Sodium (VOLTAREN ) 1 % GEL Apply 2 g topically as needed (pain).  03/28/20   [provider]  doxycycline  (VIBRAMYCIN ) 100 MG capsule Take 1 capsule (100 mg total) by mouth 2 (two) times daily. 03/28/21   Geiple, Joshua, PA-C  FLUoxetine  (PROZAC ) 20 MG capsule Take 1 capsule (20 mg total) by mouth daily. 08/02/20 09/01/20  [place], Joshua Care, DO  furosemide   (LASIX ) 20 MG tablet Take 2 tablets (40 mg total) by mouth daily. 05/30/23   Orvilla Blander, MD  gabapentin  (NEURONTIN ) 300 MG capsule Take 3 capsules (900 mg total) by mouth 2 (two) times daily. 08/02/20 09/01/20  [place], Joshua Care, DO  gabapentin  (NEURONTIN ) 600 MG tablet Take 2 tablets (1,200 mg total) by mouth at bedtime. 08/02/20 09/01/20  [place], Joshua Care, DO  hydrochlorothiazide  (HYDRODIURIL ) 12.5 MG tablet Take 1 tablet (12.5 mg total) by mouth daily. 08/02/20 09/01/20  [place], Joshua Care, DO  LORazepam  (ATIVAN ) 1 MG tablet Take 1 tablet (1 mg total) by mouth at bedtime. Patient taking differently: Take 1 mg by mouth 3 (three) times daily.  08/29/17   Jari Merles, PA-C  nicotine  (NICODERM CQ  - DOSED IN MG/24 HOURS) 21 mg/24hr patch Place 21 mg onto the skin daily. 05/09/20   [provider]  pantoprazole  (PROTONIX ) 40 MG tablet Take 1 tablet (40 mg total) by mouth daily. 08/02/20 09/01/20  [place], Joshua J, DO  POLY-IRON 150 150 MG capsule Take 150 mg by mouth daily. 05/09/20   [provider]  SM NICOTINE  POLACRILEX 2 MG gum Take 2 mg by mouth as needed for smoking cessation.  03/28/20   [provider]      Allergies    Patient has no known allergies.    Review of Systems   Review of Systems  All other systems reviewed and are negative.   Physical Exam Updated Vital  Signs BP (!) 145/80 (BP Location: Left Arm)   Pulse 92   Temp (!) 101 F (38.3 C) (Oral)   Resp 20   SpO2 94%  Physical Exam Vitals and nursing note reviewed.  Constitutional:      General: He is not in acute distress.    Appearance: He is well-developed. He is obese. He is ill-appearing.  HENT:     Head: Normocephalic and atraumatic.  Eyes:     Conjunctiva/sclera: Conjunctivae normal.  Cardiovascular:     Rate and Rhythm: Normal rate and regular rhythm.     Heart sounds: No murmur heard. Pulmonary:     Effort: Pulmonary effort is normal. No respiratory distress.     Breath  sounds: Normal breath sounds. No wheezing, rhonchi or rales.  Abdominal:     Palpations: Abdomen is soft.     Tenderness: There is no abdominal tenderness. There is no guarding.  Musculoskeletal:        General: No swelling.     Cervical back: Neck supple.     Comments: Movement of the right hip in any direction causes pain.  No surrounding erythema appreciable on exam.  Pedal pulses dopplerable and symmetric bilaterally.  Lower extremity edema 1+.  Patient also with erythema and induration on dorsal aspect of left forearm.  See media below.  Tactilely warm to the touch diffusely.  Skin:    General: Skin is warm and dry.     Capillary Refill: Capillary refill takes less than 2 seconds.  Neurological:     Mental Status: He is alert.  Psychiatric:        Mood and Affect: Mood normal.    ED Results / Procedures / Treatments   Labs (all labs ordered are listed, but only abnormal results are displayed) Labs Reviewed  RESP PANEL BY RT-PCR (RSV, FLU A&B, COVID)  RVPGX2  CULTURE, BLOOD (ROUTINE X 2)  CULTURE, BLOOD (ROUTINE X 2)  LACTIC ACID, PLASMA  LACTIC ACID, PLASMA  COMPREHENSIVE METABOLIC PANEL WITH GFR  CBC WITH DIFFERENTIAL/PLATELET  PROTIME-INR  URINALYSIS, W/ REFLEX TO CULTURE (INFECTION SUSPECTED)  SEDIMENTATION RATE  C-REACTIVE PROTEIN  RAPID URINE DRUG SCREEN, HOSP PERFORMED    EKG None  Radiology No results found.  Procedures .Critical Jacobs  Performed by: Fairchance Butter, PA Authorized by: Odenton Butter, PA   Critical Jacobs provider statement:    Critical Jacobs time (minutes):  43   Critical Jacobs was necessary to treat or prevent imminent or life-threatening deterioration of the following conditions:  Sepsis   Critical Jacobs was time spent personally by me on the following activities:  Development of treatment plan with patient or surrogate, discussions with consultants, evaluation of patient's response to treatment, examination of patient, ordering and  review of laboratory studies, ordering and review of radiographic studies, ordering and performing treatments and interventions, pulse oximetry, re-evaluation of patient's condition and review of old charts   I assumed direction of critical Jacobs for this patient from another provider in my specialty: no       Medications Ordered in ED Medications  lactated ringers  infusion (has no administration in time range)  acetaminophen  (TYLENOL ) tablet 1,000 mg (1,000 mg Oral Given 03/03/24 2225)  lactated ringers  bolus 1,000 mL (1,000 mLs Intravenous New Bag/Given 03/03/24 2226)    ED Course/ Medical Decision Making/ A&P  Medical Decision Making Amount and/or Complexity of Data Reviewed Labs: ordered. Radiology: ordered.  Risk OTC drugs. Prescription drug management.   This patient presents to the ED for concern of hip pain, this involves an extensive number of treatment options, and is a complaint that carries with it a high risk of complications and morbidity.  The differential diagnosis includes septic arthritis, osteoarthritis, sepsis, fracture, dislocation, ischemic limb, DVT, other   Co morbidities that complicate the patient evaluation  See HPI   Additional history obtained:  Additional history obtained from EMR External records from outside source obtained and reviewed including hospital records   Lab Tests:  I Ordered, and personally interpreted labs.  The pertinent results include: Leukocytosis 16.4 with left shift.  Anemia with hemoglobin 11.9.  Platelets within range.  PT elevated at 15.5 INR within normal limits.  Lactic acid normal at 1.7.  Sed rate, CRP pending.  UDS positive for opiates, cocaine, THC.  UA with 3 bacteria, 15 ketones, 100 proteins but otherwise unremarkable.   Imaging Studies ordered:  I ordered imaging studies including chest x-ray, left forearm x-ray, CT right hip I independently visualized and interpreted imaging  which showed  Chest x-ray: No lung volumes with pulmonary vascular congestion. Left arm x-ray: Diffuse swelling.  Needle CT right hip: pending I agree with the radiologist interpretation   Cardiac Monitoring: / EKG:  The patient was maintained on a cardiac monitor.  I personally viewed and interpreted the cardiac monitored which showed an underlying rhythm of: Sinus rhythm.  Borderline LAD.  Prolonged QT.  Similar appearing from 7/24.  Consultations Obtained:  N/a   Problem List / ED Course / Critical interventions / Medication management  Sepsis, right hip pain I ordered medication including vancomycin , cefepime , lactated Ringer 's   Reevaluation of the patient after these medicines showed that the patient stayed the same I have reviewed the patients home medicines and have made adjustments as needed   Social Determinants of Health:  Polysubstance use   Test / Admission - Considered:  Sepsis, right hip pain, cellulitis left arm Vitals signs significant for Abrol oral temp of 101.  Hypertension.. Otherwise within normal range and stable throughout visit. Laboratory/imaging studies significant for: See above 48 year old male presents emergency department with complaints of right hip pain.  States he has had pain for the past 2 days.  Denies any known trauma/falls.  States he has a history of septic arthritis of the left having to have his femoral head removed and feels like this is the exact same but on the right.  Patient with history of IV drug use and reports continued intravenous drug use with most recent heroin dose yesterday.  Denies any fever but patient febrile upon presentation.  Does report associated nausea.  Denies any chest pain, shortness of breath, cough, abdominal pain, urinary symptoms.  Patient currently on clindamycin  for chronic left septic arthritis of hip On exam, extreme guarding of right hip with pain with any passive range of motion.  No obvious erythema  externally appreciated.  No pulse deficits to suggest ischemic limb.  No obvious reported trauma concerning for fracture or dislocation.  Pending CT imaging of hip at time of shift change.  Rest of physical exam concerning for left-sided arm cellulitis with x-ray concerning for needle fragment.  Patient meeting SIRS criteria with tachycardia, leukocytosis so sepsis protocol was performed including broad-spectrum antibiotics given patient's history of MRSA bacteremia and current IV drug.  Patient to be admitted [facility] to [facility] if bed available given patient's Jacobs is majority of the at this hospital facility.  At time of shift change, patient Jacobs handed off to Dr. Darwin English.  Patient stable upon shift change.        Final Clinical Impression(s) / ED Diagnoses Final diagnoses:  None    Rx / DC Orders ED Discharge Orders     None         [place] Butter, [place] 03/04/24 1610    Tonya Fredrickson, MD 03/05/24 [phone]

## 2024-03-03 NOTE — ED Triage Notes (Signed)
 Pt has previous infected ball joint on left side two years ago. Sees all docs and ID doc at [facility]. MRSA and staff. Previous hx of IV drug and admits to using yesterday.

## 2024-03-04 DIAGNOSIS — D649 Anemia, unspecified: Secondary | ICD-10-CM | POA: Diagnosis not present

## 2024-03-04 DIAGNOSIS — M25551 Pain in right hip: Secondary | ICD-10-CM | POA: Diagnosis present

## 2024-03-04 DIAGNOSIS — R825 Elevated urine levels of drugs, medicaments and biological substances: Secondary | ICD-10-CM | POA: Diagnosis not present

## 2024-03-04 DIAGNOSIS — D72829 Elevated white blood cell count, unspecified: Secondary | ICD-10-CM | POA: Diagnosis not present

## 2024-03-04 DIAGNOSIS — L03114 Cellulitis of left upper limb: Secondary | ICD-10-CM | POA: Diagnosis not present

## 2024-03-04 DIAGNOSIS — A419 Sepsis, unspecified organism: Secondary | ICD-10-CM | POA: Diagnosis not present

## 2024-03-04 DIAGNOSIS — R791 Abnormal coagulation profile: Secondary | ICD-10-CM | POA: Diagnosis not present

## 2024-03-04 DIAGNOSIS — Z79899 Other long term (current) drug therapy: Secondary | ICD-10-CM | POA: Diagnosis not present

## 2024-03-04 LAB — SEDIMENTATION RATE: Sed Rate: 62 mm/h — ABNORMAL HIGH (ref 0–16)

## 2024-03-04 LAB — C-REACTIVE PROTEIN: CRP: 28.1 mg/dL — ABNORMAL HIGH (ref <1.0)

## 2024-03-04 MED ORDER — FENTANYL CITRATE PF 50 MCG/ML IJ SOSY
100.0000 ug | PREFILLED_SYRINGE | Freq: Once | INTRAMUSCULAR | Status: AC
  Administered 2024-03-04: 100 ug via INTRAVENOUS
  Filled 2024-03-04: qty 2

## 2024-03-04 MED ORDER — FENTANYL CITRATE PF 50 MCG/ML IJ SOSY
50.0000 ug | PREFILLED_SYRINGE | Freq: Once | INTRAMUSCULAR | Status: DC
  Filled 2024-03-04: qty 1

## 2024-03-04 NOTE — Discharge Instructions (Signed)
 You are being transferred to [facility] due to concern for septic arthritis of the hip

## 2024-03-05 ENCOUNTER — Telehealth (HOSPITAL_BASED_OUTPATIENT_CLINIC_OR_DEPARTMENT_OTHER): Payer: Self-pay

## 2024-03-05 LAB — BLOOD CULTURE ID PANEL (REFLEXED) - BCID2

## 2024-03-05 NOTE — Telephone Encounter (Signed)
 Received call from [facility] lab stating patient had 1 out of 4 culture bottles positive for staph aureus. Notifying Dr. Efraim Grange, EDP.

## 2024-03-05 NOTE — Telephone Encounter (Signed)
 Per Dr. Efraim Grange, pt being appropriately treated at [facility]. No need to notify of result.

## 2024-03-06 ENCOUNTER — Telehealth (HOSPITAL_BASED_OUTPATIENT_CLINIC_OR_DEPARTMENT_OTHER): Payer: Self-pay | Admitting: Emergency Medicine

## 2024-03-07 LAB — CULTURE, BLOOD (ROUTINE X 2): Special Requests: ADEQUATE

## 2024-04-21 ENCOUNTER — Emergency Department (HOSPITAL_BASED_OUTPATIENT_CLINIC_OR_DEPARTMENT_OTHER)

## 2024-04-21 ENCOUNTER — Emergency Department (HOSPITAL_BASED_OUTPATIENT_CLINIC_OR_DEPARTMENT_OTHER)
Admission: EM | Admit: 2024-04-21 | Discharge: 2024-04-22 | Disposition: A | Discharge status: Other Acute Inpatient Hospital | Attending: Emergency Medicine | Admitting: Emergency Medicine

## 2024-04-21 ENCOUNTER — Encounter (HOSPITAL_BASED_OUTPATIENT_CLINIC_OR_DEPARTMENT_OTHER): Payer: Self-pay | Admitting: Emergency Medicine

## 2024-04-21 DIAGNOSIS — R509 Fever, unspecified: Secondary | ICD-10-CM | POA: Diagnosis present

## 2024-04-21 DIAGNOSIS — M25551 Pain in right hip: Secondary | ICD-10-CM

## 2024-04-21 DIAGNOSIS — I10 Essential (primary) hypertension: Secondary | ICD-10-CM | POA: Diagnosis not present

## 2024-04-21 DIAGNOSIS — F1910 Other psychoactive substance abuse, uncomplicated: Secondary | ICD-10-CM

## 2024-04-21 DIAGNOSIS — Z79899 Other long term (current) drug therapy: Secondary | ICD-10-CM | POA: Diagnosis not present

## 2024-04-21 DIAGNOSIS — A419 Sepsis, unspecified organism: Secondary | ICD-10-CM | POA: Diagnosis not present

## 2024-04-21 LAB — CBC WITH DIFFERENTIAL/PLATELET
Abs Immature Granulocytes: 0.12 10*3/uL — ABNORMAL HIGH (ref 0.00–0.07)
Basophils Absolute: 0 10*3/uL (ref 0.0–0.1)
Basophils Relative: 0 %
Eosinophils Absolute: 0.1 10*3/uL (ref 0.0–0.5)
Eosinophils Relative: 1 %
HCT: 30.1 % — ABNORMAL LOW (ref 39.0–52.0)
Hemoglobin: 9.3 g/dL — ABNORMAL LOW (ref 13.0–17.0)
Immature Granulocytes: 1 %
Lymphocytes Relative: 11 %
Lymphs Abs: 1.6 10*3/uL (ref 0.7–4.0)
MCH: 22.8 pg — ABNORMAL LOW (ref 26.0–34.0)
MCHC: 30.9 g/dL (ref 30.0–36.0)
MCV: 73.8 fL — ABNORMAL LOW (ref 80.0–100.0)
Monocytes Absolute: 1.4 10*3/uL — ABNORMAL HIGH (ref 0.1–1.0)
Monocytes Relative: 10 %
Neutro Abs: 11 10*3/uL — ABNORMAL HIGH (ref 1.7–7.7)
Neutrophils Relative %: 77 %
Platelets: 439 10*3/uL — ABNORMAL HIGH (ref 150–400)
RBC: 4.08 MIL/uL — ABNORMAL LOW (ref 4.22–5.81)
RDW: 15.5 % (ref 11.5–15.5)
WBC: 14.2 10*3/uL — ABNORMAL HIGH (ref 4.0–10.5)
nRBC: 0 % (ref 0.0–0.2)

## 2024-04-21 LAB — COMPREHENSIVE METABOLIC PANEL WITH GFR
ALT: 32 U/L (ref 0–44)
AST: 34 U/L (ref 15–41)
Albumin: 3.7 g/dL (ref 3.5–5.0)
Alkaline Phosphatase: 207 U/L — ABNORMAL HIGH (ref 38–126)
Anion gap: 13 (ref 5–15)
BUN: 16 mg/dL (ref 6–20)
CO2: 25 mmol/L (ref 22–32)
Calcium: 9.4 mg/dL (ref 8.9–10.3)
Chloride: 96 mmol/L — ABNORMAL LOW (ref 98–111)
Creatinine, Ser: 0.85 mg/dL (ref 0.61–1.24)
GFR, Estimated: >60 mL/min (ref >60)
Glucose, Bld: 110 mg/dL — ABNORMAL HIGH (ref 70–99)
Potassium: 3.9 mmol/L (ref 3.5–5.1)
Sodium: 134 mmol/L — ABNORMAL LOW (ref 135–145)
Total Bilirubin: 0.7 mg/dL (ref 0.0–1.2)
Total Protein: 8.3 g/dL — ABNORMAL HIGH (ref 6.5–8.1)

## 2024-04-21 LAB — LACTIC ACID, PLASMA: Lactic Acid, Venous: 1.1 mmol/L (ref 0.5–1.9)

## 2024-04-21 LAB — RESP PANEL BY RT-PCR (RSV, FLU A&B, COVID)  RVPGX2
Influenza A by PCR: NEGATIVE
Influenza B by PCR: NEGATIVE
Resp Syncytial Virus by PCR: NEGATIVE
SARS Coronavirus 2 by RT PCR: NEGATIVE

## 2024-04-21 LAB — PROTIME-INR
INR: 1.2 (ref 0.8–1.2)
Prothrombin Time: 15.7 s — ABNORMAL HIGH (ref 11.4–15.2)

## 2024-04-21 MED ORDER — VANCOMYCIN HCL IN DEXTROSE 1-5 GM/200ML-% IV SOLN
1000.0000 mg | Freq: Once | INTRAVENOUS | Status: AC
  Administered 2024-04-22: 1000 mg via INTRAVENOUS
  Filled 2024-04-21: qty 200

## 2024-04-21 MED ORDER — LACTATED RINGERS IV BOLUS (SEPSIS)
1000.0000 mL | Freq: Once | INTRAVENOUS | Status: AC
  Administered 2024-04-21: 1000 mL via INTRAVENOUS

## 2024-04-21 MED ORDER — LACTATED RINGERS IV BOLUS (SEPSIS): 1000.0000 mL | Freq: Once | INTRAVENOUS | Status: DC

## 2024-04-21 MED ORDER — LACTATED RINGERS IV BOLUS (SEPSIS): 500.0000 mL | Freq: Once | INTRAVENOUS | Status: DC

## 2024-04-21 MED ORDER — LACTATED RINGERS IV SOLN: INTRAVENOUS | Status: DC

## 2024-04-21 MED ORDER — METRONIDAZOLE 500 MG/100ML IV SOLN
500.0000 mg | Freq: Once | INTRAVENOUS | Status: AC
  Administered 2024-04-21: 500 mg via INTRAVENOUS
  Filled 2024-04-21: qty 100

## 2024-04-21 MED ORDER — VANCOMYCIN HCL 500 MG IV SOLR
500.0000 mg | Freq: Once | INTRAVENOUS | Status: AC
  Administered 2024-04-22: 500 mg via INTRAVENOUS

## 2024-04-21 MED ORDER — SODIUM CHLORIDE 0.9 % IV SOLN
2.0000 g | Freq: Once | INTRAVENOUS | Status: AC
  Administered 2024-04-21: 2 g via INTRAVENOUS
  Filled 2024-04-21: qty 12.5

## 2024-04-21 NOTE — ED Provider Notes (Signed)
 [place] [facility] Provider Note   CSN: [id number] Arrival date & time: 04/21/24  2124     History  Chief Complaint  Patient presents with   Hip Pain    Joshua Jacobs is a 48 y.o. male.  Patient well-known at [facility].  Patient was seen by infectious disease [facility] outpatient yesterday.  They were concerned about bacteremia.  Patient has a history of IV drug abuse.  Patient was admitted extensively in April for necrosis and left hip infection with osteomyelitis.  Patient was instructed yesterday to go directly to the [facility] emergency department for admission and they wanted infectious disease to be consulted.  Patient did not follow through with that completely.  Called EMS to bring him here.  Insisted that he be brought here.  Patient with a complaint of right hip pain not left hip pain and right femur pain.  Temp here 100.3 heart rate 96 respirations 24 blood pressure was 96/66 oxygen saturation is 91% on room air.  Patient not currently on antibiotics.  Past medical history is significant for opioid abuse polysubstance abuse IV drug abuse history of MRSA bacteremia chronic hip pain degenerative joint disease posttraumatic stress disorder hypertension and morbid obesity.  Patient had a transesophageal echocardiogram last done in 2021.  Patient's everyday smoker.  Patient denies any abdominal pain.  Denies any respiratory symptoms       Home Medications Prior to Admission medications   Medication Sig Start Date End Date Taking? Authorizing Provider  acetaminophen  (TYLENOL ) 500 MG tablet Take 1,000 mg by mouth as needed for moderate pain or headache.     [provider]  amLODipine  (NORVASC ) 5 MG tablet Take 1 tablet (5 mg total) by mouth daily. 08/02/20 09/01/20  [place], Eric J, DO  Buprenorphine  HCl-Naloxone  HCl 8-2 MG FILM Place 1 Film under the tongue in the morning and at bedtime.  05/09/20   [provider]  diclofenac   Sodium (VOLTAREN ) 1 % GEL Apply 2 g topically as needed (pain).  03/28/20   [provider]  doxycycline  (VIBRAMYCIN ) 100 MG capsule Take 1 capsule (100 mg total) by mouth 2 (two) times daily. 03/28/21   Geiple, Joshua, PA-C  FLUoxetine  (PROZAC ) 20 MG capsule Take 1 capsule (20 mg total) by mouth daily. 08/02/20 09/01/20  [place], Rema Care, DO  furosemide  (LASIX ) 20 MG tablet Take 2 tablets (40 mg total) by mouth daily. 05/30/23   Orvilla Blander, MD  gabapentin  (NEURONTIN ) 300 MG capsule Take 3 capsules (900 mg total) by mouth 2 (two) times daily. 08/02/20 09/01/20  [place], Eric J, DO  gabapentin  (NEURONTIN ) 600 MG tablet Take 2 tablets (1,200 mg total) by mouth at bedtime. 08/02/20 09/01/20  [place], Rema Care, DO  hydrochlorothiazide  (HYDRODIURIL ) 12.5 MG tablet Take 1 tablet (12.5 mg total) by mouth daily. 08/02/20 09/01/20  [place], Rema Care, DO  LORazepam  (ATIVAN ) 1 MG tablet Take 1 tablet (1 mg total) by mouth at bedtime. Patient taking differently: Take 1 mg by mouth 3 (three) times daily.  08/29/17   Jari Merles, PA-C  nicotine  (NICODERM CQ  - DOSED IN MG/24 HOURS) 21 mg/24hr patch Place 21 mg onto the skin daily. 05/09/20   [provider]  pantoprazole  (PROTONIX ) 40 MG tablet Take 1 tablet (40 mg total) by mouth daily. 08/02/20 09/01/20  [place], Eric J, DO  POLY-IRON 150 150 MG capsule Take 150 mg by mouth daily. 05/09/20   [provider]  SM NICOTINE  POLACRILEX 2 MG gum  Take 2 mg by mouth as needed for smoking cessation.  03/28/20   [provider]      Allergies    Patient has no known allergies.    Review of Systems   Review of Systems  Constitutional:  Positive for chills and fever.  HENT:  Negative for ear pain and sore throat.   Eyes:  Negative for pain and visual disturbance.  Respiratory:  Negative for cough and shortness of breath.   Cardiovascular:  Negative for chest pain and palpitations.  Gastrointestinal:  Negative for abdominal pain  and vomiting.  Genitourinary:  Negative for dysuria and hematuria.  Musculoskeletal:  Positive for arthralgias. Negative for back pain.  Skin:  Negative for color change and rash.  Neurological:  Negative for seizures and syncope.  All other systems reviewed and are negative.   Physical Exam Updated Vital Signs BP 96/66 (BP Location: Right Wrist)   Pulse 96   Temp 100.3 F (37.9 C)   Resp (!) 24   Ht 1.676 m (5\' 6" )   Wt 136.1 kg   SpO2 91%   BMI 48.42 kg/m  Physical Exam Vitals and nursing note reviewed.  Constitutional:      General: He is not in acute distress.    Appearance: Normal appearance. He is well-developed. He is obese. He is ill-appearing.  HENT:     Head: Normocephalic and atraumatic.  Eyes:     Extraocular Movements: Extraocular movements intact.     Conjunctiva/sclera: Conjunctivae normal.     Pupils: Pupils are equal, round, and reactive to light.  Cardiovascular:     Rate and Rhythm: Regular rhythm. Tachycardia present.     Heart sounds: No murmur heard. Pulmonary:     Effort: Pulmonary effort is normal. No respiratory distress.     Breath sounds: Normal breath sounds. No wheezing or rales.  Abdominal:     Palpations: Abdomen is soft.     Tenderness: There is no abdominal tenderness.  Musculoskeletal:        General: No swelling or deformity.     Cervical back: Neck supple.     Comments: No obvious deformity to bilateral hips.  No deformity to the right femur.  Neurovascularly intact distally.  Multiple track marks to both upper extremities.  Skin:    General: Skin is warm and dry.     Capillary Refill: Capillary refill takes less than 2 seconds.  Neurological:     General: No focal deficit present.     Mental Status: He is alert and oriented to person, place, and time.  Psychiatric:        Mood and Affect: Mood normal.     ED Results / Procedures / Treatments   Labs (all labs ordered are listed, but only abnormal results are displayed) Labs  Reviewed  CBC WITH DIFFERENTIAL/PLATELET - Abnormal; Notable for the following components:      Result Value   WBC 14.2 (*)    RBC 4.08 (*)    Hemoglobin 9.3 (*)    HCT 30.1 (*)    MCV 73.8 (*)    MCH 22.8 (*)    Platelets 439 (*)    Neutro Abs 11.0 (*)    Monocytes Absolute 1.4 (*)    Abs Immature Granulocytes 0.12 (*)    All other components within normal limits  RESP PANEL BY RT-PCR (RSV, FLU A&B, COVID)  RVPGX2  CULTURE, BLOOD (ROUTINE X 2)  CULTURE, BLOOD (ROUTINE X 2)  LACTIC ACID, PLASMA  LACTIC ACID, PLASMA  COMPREHENSIVE METABOLIC PANEL WITH GFR  PROTIME-INR  URINALYSIS, W/ REFLEX TO CULTURE (INFECTION SUSPECTED)    EKG EKG Interpretation Date/Time:  Saturday April 21 2024 22:46:07 EDT Ventricular Rate:  95 PR Interval:  126 QRS Duration:  91 QT Interval:  374 QTC Calculation: 471 R Axis:   -47  Text Interpretation: Sinus rhythm LAD, consider left anterior fascicular block RSR' in V1 or V2, right VCD or RVH No significant change since last tracing Confirmed by Elizaveta Mattice [id number]) on 04/21/2024 10:49:33 PM  Radiology DG Chest Port 1 View Result Date: 04/21/2024 CLINICAL DATA:  Right hip pain, bone infection in necrosis in April. Increase in physical activity this week. Pain increased since. Questionable sepsis. EXAM: PORTABLE CHEST 1 VIEW COMPARISON:  03/03/2024 FINDINGS: Stable cardiac enlargement. Pulmonary vascular congestion. No focal consolidation, pleural effusion, or pneumothorax. No displaced rib fractures. IMPRESSION: Cardiomegaly with pulmonary vascular congestion. No change from 03/03/2024. Electronically Signed   By: Rozell Cornet M.D.   On: 04/21/2024 22:27    Procedures Procedures    Medications Ordered in ED Medications  lactated ringers  infusion (has no administration in time range)  lactated ringers  bolus 1,000 mL (1,000 mLs Intravenous New Bag/Given 04/21/24 2240)    And  lactated ringers  bolus 1,000 mL (1,000 mLs Intravenous New Bag/Given  04/21/24 2241)    And  lactated ringers  bolus 1,000 mL (has no administration in time range)    And  lactated ringers  bolus 1,000 mL (has no administration in time range)    And  lactated ringers  bolus 500 mL (has no administration in time range)  ceFEPIme  (MAXIPIME ) 2 g in sodium chloride  0.9 % 100 mL IVPB (2 g Intravenous New Bag/Given 04/21/24 2240)  metroNIDAZOLE  (FLAGYL ) IVPB 500 mg (has no administration in time range)  vancomycin  (VANCOCIN ) IVPB 1000 mg/200 mL premix (has no administration in time range)  vancomycin  (VANCOCIN ) 500 mg in sodium chloride  0.9 % 100 mL IVPB (has no administration in time range)    ED Course/ Medical Decision Making/ A&P                                 Medical Decision Making Amount and/or Complexity of Data Reviewed Labs: ordered. Radiology: ordered.  Risk Prescription drug management.   CRITICAL CARE Performed by: Sharol Croghan Total critical care time: 45 minutes Critical care time was exclusive of separately billable procedures and treating other patients. Critical care was necessary to treat or prevent imminent or life-threatening deterioration. Critical care was time spent personally by me on the following activities: development of treatment plan with patient and/or surrogate as well as nursing, discussions with consultants, evaluation of patient's response to treatment, examination of patient, obtaining history from patient or surrogate, ordering and performing treatments and interventions, ordering and review of laboratory studies, ordering and review of radiographic studies, pulse oximetry and re-evaluation of patient's condition.  Based on patient's visit yesterday and his presentation here started sepsis protocol.  Patient's white blood cell count was over 14,000 but just at 14.2.  Hemoglobin 9.3 platelets 439.  Basic metabolic panel alk phos at 207 but renal function normal.  Sodium down a little bit at 134.  Glucose 110 potassium normal  LFTs otherwise normal.  Respiratory panel negative.  Blood cultures pending.  Lactic acid was 1.1 which is very reassuring.  Patient was started on sepsis protocol including broad-spectrum antibiotics.  X-ray of right femur both  hips were ordered.  Chest x-ray shows some vascular congestion and cardiomegaly.   Patient will probably need readmission back to [facility].  Discussed with Dr. Lula Sale overnight ED physician.  Final Clinical Impression(s) / ED Diagnoses Final diagnoses:  Sepsis, due to unspecified organism, unspecified whether acute organ dysfunction present [facility])  Substance abuse [facility])    Rx / DC Orders ED Discharge Orders     None         Nicklas Barns, MD 04/21/24 2350

## 2024-04-21 NOTE — ED Triage Notes (Signed)
 BIB [facility] for R hip pain. Pt reports having "bone infection and necrosis" in April. Care at [facility] for same. Per pt increase in physical activity for PT this week. Pain increased since.

## 2024-04-21 NOTE — Progress Notes (Signed)
 Pt being followed by [facility] for Sepsis protocol.

## 2024-04-22 DIAGNOSIS — A419 Sepsis, unspecified organism: Secondary | ICD-10-CM | POA: Diagnosis not present

## 2024-04-22 LAB — URINALYSIS, W/ REFLEX TO CULTURE (INFECTION SUSPECTED)
Glucose, UA: NEGATIVE mg/dL
Hgb urine dipstick: NEGATIVE
Ketones, ur: NEGATIVE mg/dL
Leukocytes,Ua: NEGATIVE
Nitrite: NEGATIVE
Protein, ur: 100 mg/dL — AB
Specific Gravity, Urine: 1.02 (ref 1.005–1.030)
pH: 6 (ref 5.0–8.0)

## 2024-04-22 LAB — BLOOD CULTURE ID PANEL (REFLEXED) - BCID2

## 2024-04-22 MED ORDER — KETOROLAC TROMETHAMINE 30 MG/ML IJ SOLN
30.0000 mg | Freq: Once | INTRAMUSCULAR | Status: AC
  Administered 2024-04-22: 30 mg via INTRAVENOUS
  Filled 2024-04-22: qty 1

## 2024-04-22 NOTE — ED Notes (Signed)
 Pt transported to [facility] via [place].

## 2024-04-22 NOTE — ED Provider Notes (Signed)
  Physical Exam  BP (!) 145/102   Pulse 92   Temp 99 F (37.2 C) (Oral)   Resp (!) 23   Ht 5\' 6"  (1.676 m)   Wt 136.1 kg   SpO2 96%   BMI 48.42 kg/m   Physical Exam Vitals and nursing note reviewed.  Constitutional:      Appearance: Normal appearance.  HENT:     Head: Normocephalic.  Pulmonary:     Effort: Pulmonary effort is normal.  Neurological:     Mental Status: He is alert and oriented to person, place, and time.     Procedures  Procedures  ED Course / MDM    Medical Decision Making Amount and/or Complexity of Data Reviewed Labs: ordered. Radiology: ordered.  Risk Prescription drug management.   Care assumed from Dr. Zackowski at shift change.  Patient awaiting results of bilateral hip and right femur x-rays.  These have returned and show chronic changes.  This patient has a history of IV drug abuse with chronic osteomyelitis of both hips.  He was seen at [facility] by his infectious disease doctor yesterday and was told he needed to go to the ER to be admitted.  Instead of going to the ER, he went home, then called 911 and came here today.  He arrives here febrile with temp of 100.3.  He has a white count of 13.4 and hemoglobin of 9.3, but laboratory studies otherwise unremarkable.  His lactate is normal.  Care has been discussed with Dr. Fernand Howard from [facility] who is excepted patient in transfer.       Orvilla Blander, MD 04/22/24 [id number]

## 2024-04-24 LAB — CULTURE, BLOOD (ROUTINE X 2): Special Requests: ADEQUATE

## 2024-04-25 ENCOUNTER — Telehealth (HOSPITAL_BASED_OUTPATIENT_CLINIC_OR_DEPARTMENT_OTHER): Payer: Self-pay | Admitting: *Deleted

## 2024-04-25 LAB — CULTURE, BLOOD (ROUTINE X 2)
Culture  Setup Time: NO GROWTH
Special Requests: ADEQUATE

## 2024-04-25 NOTE — Telephone Encounter (Signed)
 Post ED Visit - Positive Culture Follow-up  Culture report reviewed by antimicrobial stewardship pharmacist: [facility] [x]  [place], [place].D. []  Skeet Duke, Pharm.D., BCPS AQ-ID []  Leslee Rase, Pharm.D., BCPS []  [place], [address].D., BCPS []  [place], [address].D., BCPS, AAHIVP []  Alcide Aly, Pharm.D., BCPS, AAHIVP []  Jerri Morale, PharmD, BCPS []  Graham Laws, PharmD, BCPS []  Cleda Curly, PharmD, BCPS []  Tamar Fairly, PharmD []  Ballard Levels, PharmD, BCPS []  Ollen Beverage, PharmD  [facility] []  Arlyne Bering, PharmD []  Sherryle Don, PharmD []  Van Gelinas, PharmD []  Delila Felty, Rph []  [place]) Joshua Jacobs, PharmD []  Augustina Block, PharmD []  Arie Kurtz, PharmD []  Sharlyn Deaner, PharmD []  Agnes Hose, PharmD []  Kendall Pauls, PharmD []  Gladstone Lamer, PharmD []  Armanda Bern, PharmD []  Tera Fellows, PharmD   Positive blood culture Pt being treated at [facility]; no further patient follow-up is required at this time.  Joshua Jacobs 04/25/2024, 10:39 AM

## 2024-04-26 ENCOUNTER — Telehealth (HOSPITAL_BASED_OUTPATIENT_CLINIC_OR_DEPARTMENT_OTHER): Payer: Self-pay | Admitting: *Deleted

## 2024-04-26 NOTE — Telephone Encounter (Signed)
 Post ED Visit - Positive Culture Follow-up  Culture report reviewed by antimicrobial stewardship pharmacist: [facility] [x]  [place], [place].D. []  Skeet Duke, Pharm.D., BCPS AQ-ID []  Leslee Rase, Pharm.D., BCPS []  Garland Junk, Pharm.D., BCPS []  [place], [address].D., BCPS, AAHIVP []  Alcide Aly, Pharm.D., BCPS, AAHIVP []  Jerri Morale, PharmD, BCPS []  Graham Laws, PharmD, BCPS []  Cleda Curly, PharmD, BCPS []  Tamar Fairly, PharmD []  Ballard Levels, PharmD, BCPS []  Ollen Beverage, PharmD  [facility] []  Arlyne Bering, PharmD []  Sherryle Don, PharmD []  Van Gelinas, PharmD []  Delila Felty, Rph []  [place]) Cleora Daft, PharmD []  Augustina Block, PharmD []  Arie Kurtz, PharmD []  Sharlyn Deaner, PharmD []  Agnes Hose, PharmD []  Kendall Pauls, PharmD []  Gladstone Lamer, PharmD []  Armanda Bern, PharmD []  Tera Fellows, PharmD   Positive blood culture Pt being treated at [facility]; no further patient follow-up is required at this time.  Zeb Heys 04/26/2024, 10:50 AM

## 2024-06-22 NOTE — Telephone Encounter (Signed)
 [facility] informed floor of Positive blood culture

## 2024-09-06 ENCOUNTER — Emergency Department (HOSPITAL_BASED_OUTPATIENT_CLINIC_OR_DEPARTMENT_OTHER)
Admission: EM | Admit: 2024-09-06 | Discharge: 2024-09-06 | Disposition: A | Discharge status: Home / Self Care | Attending: Emergency Medicine | Admitting: Emergency Medicine

## 2024-09-06 ENCOUNTER — Emergency Department (HOSPITAL_BASED_OUTPATIENT_CLINIC_OR_DEPARTMENT_OTHER)

## 2024-09-06 DIAGNOSIS — R55 Syncope and collapse: Secondary | ICD-10-CM | POA: Diagnosis not present

## 2024-09-06 DIAGNOSIS — M8668 Other chronic osteomyelitis, other site: Secondary | ICD-10-CM | POA: Diagnosis not present

## 2024-09-06 DIAGNOSIS — Z79899 Other long term (current) drug therapy: Secondary | ICD-10-CM | POA: Diagnosis not present

## 2024-09-06 DIAGNOSIS — I10 Essential (primary) hypertension: Secondary | ICD-10-CM | POA: Diagnosis not present

## 2024-09-06 DIAGNOSIS — T50901A Poisoning by unspecified drugs, medicaments and biological substances, accidental (unintentional), initial encounter: Secondary | ICD-10-CM | POA: Diagnosis present

## 2024-09-06 LAB — CBC WITH DIFFERENTIAL/PLATELET
Abs Immature Granulocytes: 0.12 K/uL — ABNORMAL HIGH (ref 0.00–0.07)
Basophils Absolute: 0.1 K/uL (ref 0.0–0.1)
Basophils Relative: 1 %
Eosinophils Absolute: 0.1 K/uL (ref 0.0–0.5)
Eosinophils Relative: 1 %
HCT: 27.7 % — ABNORMAL LOW (ref 39.0–52.0)
Hemoglobin: 8.2 g/dL — ABNORMAL LOW (ref 13.0–17.0)
Immature Granulocytes: 1 %
Lymphocytes Relative: 8 %
Lymphs Abs: 1.2 K/uL (ref 0.7–4.0)
MCH: 20.4 pg — ABNORMAL LOW (ref 26.0–34.0)
MCHC: 29.6 g/dL — ABNORMAL LOW (ref 30.0–36.0)
MCV: 68.9 fL — ABNORMAL LOW (ref 80.0–100.0)
Monocytes Absolute: 0.9 K/uL (ref 0.1–1.0)
Monocytes Relative: 6 %
Neutro Abs: 12.2 K/uL — ABNORMAL HIGH (ref 1.7–7.7)
Neutrophils Relative %: 83 %
Platelets: 456 K/uL — ABNORMAL HIGH (ref 150–400)
RBC: 4.02 MIL/uL — ABNORMAL LOW (ref 4.22–5.81)
RDW: 16.8 % — ABNORMAL HIGH (ref 11.5–15.5)
WBC: 14.6 K/uL — ABNORMAL HIGH (ref 4.0–10.5)
nRBC: 0 % (ref 0.0–0.2)

## 2024-09-06 LAB — URINALYSIS, W/ REFLEX TO CULTURE (INFECTION SUSPECTED)
Bilirubin Urine: NEGATIVE
Glucose, UA: NEGATIVE mg/dL
Hgb urine dipstick: NEGATIVE
Ketones, ur: NEGATIVE mg/dL
Leukocytes,Ua: NEGATIVE
Nitrite: NEGATIVE
Protein, ur: NEGATIVE mg/dL
Specific Gravity, Urine: 1.025 (ref 1.005–1.030)
pH: 6 (ref 5.0–8.0)

## 2024-09-06 LAB — BASIC METABOLIC PANEL WITH GFR
Anion gap: 11 (ref 5–15)
BUN: 28 mg/dL — ABNORMAL HIGH (ref 6–20)
CO2: 27 mmol/L (ref 22–32)
Calcium: 9.2 mg/dL (ref 8.9–10.3)
Chloride: 98 mmol/L (ref 98–111)
Creatinine, Ser: 1.09 mg/dL (ref 0.61–1.24)
GFR, Estimated: >60 mL/min (ref >60)
Glucose, Bld: 121 mg/dL — ABNORMAL HIGH (ref 70–99)
Potassium: 4.9 mmol/L (ref 3.5–5.1)
Sodium: 135 mmol/L (ref 135–145)

## 2024-09-06 LAB — MAGNESIUM: Magnesium: 2.2 mg/dL (ref 1.7–2.4)

## 2024-09-06 LAB — HEPATIC FUNCTION PANEL
ALT: 91 U/L — ABNORMAL HIGH (ref 0–44)
AST: 57 U/L — ABNORMAL HIGH (ref 15–41)
Albumin: 3.6 g/dL (ref 3.5–5.0)
Alkaline Phosphatase: 217 U/L — ABNORMAL HIGH (ref 38–126)
Bilirubin, Direct: 0.2 mg/dL (ref 0.0–0.2)
Indirect Bilirubin: 0.3 mg/dL (ref 0.3–0.9)
Total Bilirubin: 0.5 mg/dL (ref 0.0–1.2)
Total Protein: 8.2 g/dL — ABNORMAL HIGH (ref 6.5–8.1)

## 2024-09-06 LAB — URINE DRUG SCREEN
Amphetamines: NEGATIVE
Barbiturates: NEGATIVE
Benzodiazepines: NEGATIVE
Cocaine: POSITIVE — AB
Fentanyl: POSITIVE — AB
Methadone Scn, Ur: NEGATIVE
Opiates: POSITIVE — AB
Tetrahydrocannabinol: POSITIVE — AB

## 2024-09-06 LAB — TROPONIN T, HIGH SENSITIVITY: Troponin T High Sensitivity: <15 ng/L (ref 0–19)

## 2024-09-06 LAB — CBG MONITORING, ED: Glucose-Capillary: 133 mg/dL — ABNORMAL HIGH (ref 70–99)

## 2024-09-06 LAB — AMMONIA: Ammonia: 22 umol/L (ref 9–35)

## 2024-09-06 LAB — ETHANOL: Alcohol, Ethyl (B): <15 mg/dL (ref <15)

## 2024-09-06 LAB — CK: Total CK: 38 U/L — ABNORMAL LOW (ref 49–397)

## 2024-09-06 LAB — ACETAMINOPHEN LEVEL: Acetaminophen (Tylenol), Serum: <10 ug/mL — ABNORMAL LOW (ref 10–30)

## 2024-09-06 LAB — SALICYLATE LEVEL: Salicylate Lvl: <7 mg/dL — ABNORMAL LOW (ref 7.0–30.0)

## 2024-09-06 LAB — LACTIC ACID, PLASMA: Lactic Acid, Venous: 0.9 mmol/L (ref 0.5–1.9)

## 2024-09-06 NOTE — Discharge Instructions (Signed)
 Joshua Jacobs  Thank you for allowing us  to take care of you today.  You came to the Emergency Department today because you were feeling abnormally after taking what you believed to be a Percocet pill as well as utilizing marijuana.  Here in the emergency department your labs were reassuring.  Your urine was positive for opiates (which means natural opioids such as oxycodone ), fentanyl , a synthetic opioid, cocaine and THC (the active ingredient of marijuana).  Your UDS as below for your your review.  You are now feeling back to your normal self, given your labs are otherwise reassuring, you are safe to follow-up outpatient with your primary care doctor.  We recommend against utilizing recreational substances, particularly because they can be contaminated, therefore regardless of what a substances marketed as, it could always potentially have more dangerous substances in it such as fentanyl .  To-Do: 1. Please follow-up with your primary doctor within 1 - 2 weeks / as soon as possible.   Please return to the Emergency Department or call 911 if you experience have worsening of your symptoms, or do not get better, chest pain, shortness of breath, severe or significantly worsening pain, high fever, severe confusion, pass out or have any reason to think that you need emergency medical care.   We hope you feel better soon.   Department of Emergency Medicine [facility]    Urine Drug Screen   Final resultCollected 10/23 07:08 Opiates POSITIVE  COCAINE POSITIVE  Benzodiazepines NEGATIVE  Amphetamines NEGATIVE  Tetrahydrocannabinol POSITIVE  Barbiturates NEGATIVE  Methadone Scn, Ur NEGATIVE  Fentanyl  POSITIVE

## 2024-09-06 NOTE — ED Provider Notes (Signed)
  [place] EMERGENCY DEPARTMENT AT [facility] [place] Provider Assume Care Note I assumed care of Joshua Jacobs on 09/06/2024 at 7 AM from Dr. Haze.   Briefly, Joshua Jacobs is a 48 y.o. male who: PMHx: Active polysubstance use disorder, chronic skin infections, prior history of IVDU in remission, P/w altered sensorium and altered mental status after marijuana use as well as taking a pill from a friend which he believed to be Percocet Then began feeling abnormal, called EMS, with EMS had jerking movements, but remains conscious, maybe had brief episode of ventricular tachycardia with EMS Here patient is clinically sober, however still feels altered sensorium, declined admission, has episodes of jerking, however remains conscious without postictal period, therefore seizure is doubtful and no arrhythmias noted during these episodes Lab workup reassuring   Plan at the time of handoff: Metabolize, reassess, hopeful discharge   Please refer to the original provider's note for additional information regarding the care of [employer].  Reassessment: I personally reassessed the patient: Patient without any acute complaints or additional questions.  Vital Signs:  ED Triage Vitals  Encounter Vitals Group     BP 09/06/24 0435 114/68     Girls Systolic BP Percentile --      Girls Diastolic BP Percentile --      Boys Systolic BP Percentile --      Boys Diastolic BP Percentile --      Pulse Rate 09/06/24 0435 94     Resp 09/06/24 0435 20     Temp 09/06/24 0435 98.4 F (36.9 C)     Temp Source 09/06/24 0435 Oral     SpO2 09/06/24 0435 92 %     Weight 09/06/24 0436 (!) 350 lb (158.8 kg)     Height 09/06/24 0436 5' 6 (1.676 m)     Head Circumference --      Peak Flow --      Pain Score 09/06/24 0436 7     Pain Loc --      Pain Education --      Exclude from Growth Chart --      Hemodynamics:  The patient is hemodynamically stable. Mental Status:  The patient is  alert  Additional MDM: Patient's labs resulted with UA negative for UTI, UDS positive for fentanyl , cocaine, opioids, THC.  On reevaluation patient continues to be clinically sober, states that he now feels that he is back at his baseline.  Given clinical sobriety and patient at baseline with otherwise reassuring labs, feel that patient is appropriate for discharge, patient was informed of his UDS results, denies recently utilizing fentanyl  or cocaine, therefore he believes that these might have been additives in the pill that he took contributing to symptoms, discussed that this is a possibility, and advised against continued recreational substance use as there is always possible to have unknown contaminants.  Disposition: DISCHARGE: I believe that the patient is safe for discharge home with outpatient follow-up. Patient was informed of all pertinent physical exam, laboratory, and imaging findings. Patient's suspected etiology of their symptom presentation was discussed with the patient and all questions were answered. We discussed following up with PCP. I provided thorough ED return precautions. The patient feels safe and comfortable with this plan.   FREDRIK CANDIE Later, MD Emergency Medicine    Later Jerilynn RAMAN, MD 09/06/24 1054

## 2024-09-06 NOTE — ED Notes (Signed)
 Attempted ultrasound IV x 1 without success. Patient tolerated well

## 2024-09-06 NOTE — ED Triage Notes (Signed)
 Took an unidentified pill and felt dizzy and shakiness. Had 2 short runs of V- tach per EMS.

## 2024-09-06 NOTE — ED Provider Notes (Signed)
 [place] EMERGENCY DEPARTMENT AT [facility] Provider Note   CSN: [id number] Arrival date & time: 09/06/24  9567     Patient presents with: Dizziness   Joshua Jacobs is a 48 y.o. male.   Patient brought to the emergency department by EMS from home.  Patient initially called EMS because he was not feeling well after taking a pill that was given to him by another individual that he believed was Percocet.  Patient became acutely dizzy and felt anxious.  During transport, however, patient had a syncopal episode and EMS believes that he had a short run of V. tach.       Prior to Admission medications   Medication Sig Start Date End Date Taking? Authorizing Provider  acetaminophen  (TYLENOL ) 500 MG tablet Take 1,000 mg by mouth as needed for moderate pain or headache.     [provider]  amLODipine  (NORVASC ) 5 MG tablet Take 1 tablet (5 mg total) by mouth daily. 08/02/20 09/01/20  [place], Eric J, DO  Buprenorphine  HCl-Naloxone  HCl 8-2 MG FILM Place 1 Film under the tongue in the morning and at bedtime.  05/09/20   [provider]  diclofenac  Sodium (VOLTAREN ) 1 % GEL Apply 2 g topically as needed (pain).  03/28/20   [provider]  doxycycline  (VIBRAMYCIN ) 100 MG capsule Take 1 capsule (100 mg total) by mouth 2 (two) times daily. 03/28/21   Geiple, Joshua, PA-C  FLUoxetine  (PROZAC ) 20 MG capsule Take 1 capsule (20 mg total) by mouth daily. 08/02/20 09/01/20  [place], Camellia PARAS, DO  furosemide  (LASIX ) 20 MG tablet Take 2 tablets (40 mg total) by mouth daily. 05/30/23   Geroldine Berg, MD  gabapentin  (NEURONTIN ) 300 MG capsule Take 3 capsules (900 mg total) by mouth 2 (two) times daily. 08/02/20 09/01/20  [place], Camellia PARAS, DO  gabapentin  (NEURONTIN ) 600 MG tablet Take 2 tablets (1,200 mg total) by mouth at bedtime. 08/02/20 09/01/20  [place], Camellia PARAS, DO  hydrochlorothiazide  (HYDRODIURIL ) 12.5 MG tablet Take 1 tablet (12.5 mg total) by mouth daily. 08/02/20  09/01/20  [place], Camellia PARAS, DO  LORazepam  (ATIVAN ) 1 MG tablet Take 1 tablet (1 mg total) by mouth at bedtime. Patient taking differently: Take 1 mg by mouth 3 (three) times daily.  08/29/17   Tommas Severa Norris, PA-C  nicotine  (NICODERM CQ  - DOSED IN MG/24 HOURS) 21 mg/24hr patch Place 21 mg onto the skin daily. 05/09/20   [provider]  pantoprazole  (PROTONIX ) 40 MG tablet Take 1 tablet (40 mg total) by mouth daily. 08/02/20 09/01/20  [place], Eric J, DO  POLY-IRON 150 150 MG capsule Take 150 mg by mouth daily. 05/09/20   [provider]  SM NICOTINE  POLACRILEX 2 MG gum Take 2 mg by mouth as needed for smoking cessation.  03/28/20   [provider]    Allergies: Patient has no active allergies.    Review of Systems  Updated Vital Signs BP 128/80   Pulse 77   Temp 98.4 F (36.9 C) (Oral)   Resp 14   Ht 5' 6 (1.676 m)   Wt (!) 158.8 kg   SpO2 97%   BMI 56.49 kg/m   Physical Exam Vitals and nursing note reviewed.  Constitutional:      Appearance: He is well-developed. He is obese. He is ill-appearing.  HENT:     Head: Normocephalic and atraumatic.     Mouth/Throat:     Mouth: Mucous membranes are moist.  Eyes:  General: Vision grossly intact. Gaze aligned appropriately.     Extraocular Movements: Extraocular movements intact.     Conjunctiva/sclera: Conjunctivae normal.  Cardiovascular:     Rate and Rhythm: Normal rate and regular rhythm.     Pulses: Normal pulses.     Heart sounds: Normal heart sounds, S1 normal and S2 normal. No murmur heard.    No friction rub. No gallop.  Pulmonary:     Effort: Pulmonary effort is normal. No respiratory distress.     Breath sounds: Normal breath sounds.  Abdominal:     Palpations: Abdomen is soft.     Tenderness: There is no abdominal tenderness. There is no guarding or rebound.     Hernia: No hernia is present.  Musculoskeletal:        General: No swelling.     Cervical back: Full passive  range of motion without pain, normal range of motion and neck supple. No pain with movement, spinous process tenderness or muscular tenderness. Normal range of motion.     Right lower leg: No edema.     Left lower leg: No edema.  Skin:    General: Skin is warm and dry.     Capillary Refill: Capillary refill takes less than 2 seconds.     Findings: Lesion (Multiple scabs on arms) present. No ecchymosis, erythema or wound.  Neurological:     Mental Status: He is alert and oriented to person, place, and time.     GCS: GCS eye subscore is 4. GCS verbal subscore is 5. GCS motor subscore is 6.     Cranial Nerves: Cranial nerves 2-12 are intact.     Sensory: Sensation is intact.     Motor: Motor function is intact. No weakness or abnormal muscle tone.     Coordination: Coordination is intact.  Psychiatric:        Mood and Affect: Mood normal.        Speech: Speech normal.        Behavior: Behavior normal.     (all labs ordered are listed, but only abnormal results are displayed) Labs Reviewed  CBC WITH DIFFERENTIAL/PLATELET - Abnormal; Notable for the following components:      Result Value   WBC 14.6 (*)    RBC 4.02 (*)    Hemoglobin 8.2 (*)    HCT 27.7 (*)    MCV 68.9 (*)    MCH 20.4 (*)    MCHC 29.6 (*)    RDW 16.8 (*)    Platelets 456 (*)    Neutro Abs 12.2 (*)    Abs Immature Granulocytes 0.12 (*)    All other components within normal limits  BASIC METABOLIC PANEL WITH GFR - Abnormal; Notable for the following components:   Glucose, Bld 121 (*)    BUN 28 (*)    All other components within normal limits  HEPATIC FUNCTION PANEL - Abnormal; Notable for the following components:   Total Protein 8.2 (*)    AST 57 (*)    ALT 91 (*)    Alkaline Phosphatase 217 (*)    All other components within normal limits  CK - Abnormal; Notable for the following components:   Total CK 38 (*)    All other components within normal limits  ACETAMINOPHEN  LEVEL - Abnormal; Notable for the  following components:   Acetaminophen  (Tylenol ), Serum <10 (*)    All other components within normal limits  SALICYLATE LEVEL - Abnormal; Notable for the following components:  Salicylate Lvl <7.0 (*)    All other components within normal limits  CULTURE, BLOOD (ROUTINE X 2)  CULTURE, BLOOD (ROUTINE X 2)  MAGNESIUM  LACTIC ACID, PLASMA  ETHANOL  AMMONIA  LACTIC ACID, PLASMA  URINALYSIS, W/ REFLEX TO CULTURE (INFECTION SUSPECTED)  URINE DRUG SCREEN  TROPONIN T, HIGH SENSITIVITY  TROPONIN T, HIGH SENSITIVITY    EKG: EKG Interpretation Date/Time:  Thursday September 06 2024 05:45:41 EDT Ventricular Rate:  75 PR Interval:  128 QRS Duration:  94 QT Interval:  426 QTC Calculation: 476 R Axis:   -5  Text Interpretation: Sinus rhythm Borderline prolonged QT interval Confirmed by Haze Lonni PARAS [phone]) on 09/06/2024 5:50:26 AM  Radiology: CT HEAD WO CONTRAST ( ) Result Date: 09/06/2024 EXAM: CT HEAD WITHOUT CONTRAST 09/06/2024 06:18:17 AM TECHNIQUE: CT of the head was performed without the administration of intravenous contrast. Automated exposure control, iterative reconstruction, and/or weight based adjustment of the mA/kV was utilized to reduce the radiation dose to as low as reasonably achievable. COMPARISON: None available. CLINICAL HISTORY: Mental status change, unknown cause. Dizziness after taking unidentified pill. Hx HTN, IV drug abuse, and polysubstance abuse. FINDINGS: BRAIN AND VENTRICLES: No acute hemorrhage. No evidence of acute infarct. No hydrocephalus. No extra-axial collection. No mass effect or midline shift. ORBITS: No acute abnormality. SINUSES: No acute abnormality. SOFT TISSUES AND SKULL: No acute soft tissue abnormality. No skull fracture. IMPRESSION: 1. Normal non-contrast CT Head. Electronically signed by: Helayne Hurst MD 09/06/2024 06:22 AM EDT RP Workstation: [id number]   DG Chest Port 1 View Result Date: 09/06/2024 EXAM: 1 VIEW(S) XRAY OF THE CHEST  09/06/2024 06:05:04 AM COMPARISON: 05/10/2024 CLINICAL HISTORY: syncope. Dizziness after taking unidentified pill. Hx HTN, IV drug abuse, and polysubstance abuse. FINDINGS: LUNGS AND PLEURA: Low lung volume. No focal pulmonary opacity. No pulmonary edema. No pleural effusion. No pneumothorax. HEART AND MEDIASTINUM: No acute abnormality of the cardiac and mediastinal silhouettes. BONES AND SOFT TISSUES: No acute osseous abnormality. IMPRESSION: 1. No acute cardiopulmonary process identified. Electronically signed by: Waddell Calk MD 09/06/2024 06:13 AM EDT RP Workstation: [id number]     Procedures   Medications Ordered in the ED - No data to display                                  Medical Decision Making Amount and/or Complexity of Data Reviewed Independent Historian: EMS External Data Reviewed: labs, radiology, ECG and notes. Labs: ordered. Decision-making details documented in ED Course. Radiology: ordered and independent interpretation performed. Decision-making details documented in ED Course. ECG/medicine tests: ordered and independent interpretation performed. Decision-making details documented in ED Course.   Differential Diagnosis considered includes, but not limited to: Arrhythmia; MI; vasovagal episode; seizure; toxidrome; PE; stroke  Patient with extensive medical history secondary to polysubstance drug abuse and history of IV drug abuse.  Patient with chronic osteomyelitis of the left hip and possible septic bursitis of the right hip recently, all of his care at [facility].  EMS brought the patient to this freestanding emergency department.  Patient initially with complaints of not feeling well after smoking marijuana and taking a pill that he believed was Percocet.  He had a syncopal episode, possible seizure activity and possible run of V. Tach  just as they got to the ambulance bay.  Patient looks chronically ill.  While being wheeled on the ambulance stretcher to the room I  did witness him become rigid, stare off and  then have some shaking of his body.  This occurred a couple of times, lasting only a few secondHe was not on the monitor when this occurred.  He did not appear to have any postictal state.  Initially not on the monitor the first couple of times this happen, then he did have some brief spells while on the monitor, no evidence of arrhythmia.  He does, however, have a prolonged QTc on EKG.  Potassium and magnesium levels are normal.  At this point I suspect that patient's syncope and behavior disturbance is secondary to what ever drug he took.  I did talk to him about admission.  He is awake, alert, oriented.  He does not appear intoxicated or incapacitated.  He does not want to be admitted.  I did discuss with him that he could have had a toxic ingestion and he did have evidence of an arrhythmia for EMS, I could not rule out seizure, etc.  We discussed all of the life-threatening conditions that could be ongoing and he reports that he does not want to be admitted, would rather be monitored here and discharged.     Final diagnoses:  Accidental drug ingestion, initial encounter  Syncope, unspecified syncope type    ED Discharge Orders     None          Haze Lonni PARAS, MD 09/06/24 [phone]

## 2024-09-06 NOTE — ED Notes (Signed)
 RN called his father (per patient request) to let him know he was here. Father said to have him call him if he needs a ride home.

## 2024-09-07 ENCOUNTER — Telehealth (HOSPITAL_BASED_OUTPATIENT_CLINIC_OR_DEPARTMENT_OTHER): Payer: Self-pay

## 2024-09-07 LAB — BLOOD CULTURE ID PANEL (REFLEXED) - BCID2

## 2024-09-07 NOTE — Telephone Encounter (Signed)
 Received call from micro lab regarding positive blood culture results in 1 out of 4 bottles. + for staph aureus. Informed Dr. Patsey who is reviewing chart. Awaiting further instructions.  0947am: no further action needed at this time

## 2024-09-09 LAB — CULTURE, BLOOD (ROUTINE X 2): Special Requests: ADEQUATE

## 2024-09-11 LAB — CULTURE, BLOOD (ROUTINE X 2): Culture: NO GROWTH

## 2024-09-18 NOTE — ED Provider Notes (Addendum)
 Patient placed in First Look pathway, seen and evaluated for chief complaint of right leg pain. 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  Denies any new complaints. Pertinent exam findings include afebrile, VSS, NAD, unlabored respirations, speaking in complete sentences. Patient counseled on process, plan, and necessity for staying for completing the evaluation.   This document serves as a record of services personally performed by Leonor Hope, PA-C.  [facility] Emergency Department Emergency Department Provider Note  This document was created using the aid of voice recognition Dragon dictation software  ____________________________________________  Time seen: 1:57 AM  I have reviewed the triage vital signs and the nursing notes.   History   Chief Complaint Leg Pain   HPI  Joshua Jacobs is a 48 y.o. male who presents to the ED with right leg pain, onset about 6 weeks ago. Patient endorses chronic left hip pain from a chronic joint infection, but for the past 6 weeks endorses severe right leg pain that is now worse than his left leg. Patient reports that about 6 weeks ago his pain was in his right hip and upper thigh, but reports that about 10 days ago the pain moved to his right knee and he has since measured fevers as high as 103F. He reports states that his right thigh and knee pain can radiated into his groin and pelvis more broadly. While he endorses a history of IV fentanyl  use, he states that he wants to detox off of drugs, and notes his leg pain is so severe that his IV drugs do not relieve the pain. Patient is wheelchair bound at baseline due to muscle atrophy in his left leg. He is on clindamycin  chronically for his chronic left hip infection. He denies any chest pain, nausea, vomiting, diarrhea, numbness, or weakness.   Physical  Exam   VITAL SIGNS:   ED Triage Vitals [09/18/24 1751]  Temp 98.3 F (36.8 C)  Heart Rate 99  Resp 16  BP 120/72  MAP (mmHg) 88  SpO2 92 %  O2 Device None (Room air)  O2 Flow Rate (L/min)   Weight (!) 151 kg (332 lb)    Constitutional: Alert and oriented. Well appearing and in no distress. Eyes: Conjunctivae are normal. ENT      Head: Normocephalic and atraumatic.      Neck: No stridor. Cardiovascular: Normal rate and rhythm. Right DP and right PT pulses detected with audible doppler. 2+ right femoral pulse.  Right foot is warm and well perfused. Heart rate: 95. Blood pressure 112/71. Respiratory: Normal respiratory effort. Breath sounds are normal. Oxygen saturation 97% on room air. Gastrointestinal: Soft and nontender. Musculoskeletal: Severely decreased passive and active range of motion to right hip. Tenderness to palpation of right thigh. Normal range of motion to to right knee and right ankle.  Neurologic: Normal sensation and normal strength to right foot. Normal speech and language. No gross focal neurologic deficits are appreciated. Psychiatric: Mood and affect are normal.   EKG   According to my interpretation the ECG shows EKG time: 23:07:59 Interpretation time: 1:57 AM Rate: 87 Rhythm: normal sinus rhythm Axis: normal Intervals: Normal, QT at 464 ms ST-T Waves: Normal ST-T Waves Comparison with Old: Yes - no significant change from previous.    Radiology   All X-rays, CTs, and MRIs interpreted by radiologist and interpretation reviewed by me.  Procedures   Procedure(s) performed: None.  Pertinent labs & imaging results that were available during my care of the patient were reviewed by me and considered in my medical decision making (see chart for details).  Total critical care time was 0 minutes. This was spent providing cardiovascular or respiratory resuscitation in a critically ill patient.    ED Clinical Impression   1. Pyogenic arthritis of  right hip, due to unspecified organism Active  2. Opioid use disorder Active  3. Soft tissues foreign body Active  4. Anemia, unspecified type Active    Medical Decision Making: Initial Impression, ED Course, Assessment and Plan   Per my interpretation the bedside cardiac monitoring shows: normal sinus rhythm.   The following chart(s) was reviewed: infectious disease office visit from 08/14/24 for osteomyelitis of left hip which also showed that he is on 300 mg of clindamycin  three times a day for his left hip infection    Medical Decision Making 48 year old male with history of IVDU, bacteremia, septic arthritis presents with right hip pain. Differential diagnosis includes septic arthritis, intramuscular abscess, septic bursitis, bacteremia, endocarditis. I have ordered broad-spectrum IV antibiotics based on this patient's allergies.  I have ordered IV fluids.  I have ordered IV Toradol  for pain.  Labs show leukocytosis and elevated inflammatory markers.  No organ failure to suggest sepsis or septic shock.  2 blood cultures obtained to rule out bacteremia. General medicine consulted for admission.  They have asked me to stop antibiotics until right hip cultures are obtained.  This seems to be a chronic or at least subacute septic arthritis so this would be reasonable.  Plan will be for orthopedic surgery for I&D with culture of synovial fluid versus IR aspiration for culture of synovial fluid. Update: General Medicine 2 team has evaluated the patient and will start broad spectrum antibiotics now with Vancomycin  and Levofloxacin .   Problems Addressed: Anemia, unspecified type: complicated acute illness or injury Opioid use disorder: complicated acute illness or injury Pyogenic arthritis of right hip, due to unspecified organism: complicated acute illness or injury Soft tissues foreign body: complicated acute illness or injury  Amount and/or Complexity of Data Reviewed Labs:  ordered. ECG/medicine tests: ordered.  Risk Prescription drug management. Decision regarding hospitalization.    Clinical Complexity  Patient's presentation is most consistent with acute presentation with potential threat to life or bodily function.  Patient's impaired access to primary care increases the complexity of managing their presentation with leg pain.    Provider time spent in patient care today, inclusive of but not limited to clinical reassessment, review of diagnostic studies, and discharge preparation, was greater than 30 minutes.        This document serves as a record of services personally performed by Fairy Brain, MD. It was created on their behalf by Karena LOISE Hurst, Scribe, a trained medical scribe. The creation of this record is the provider's dictation and/or activities during the visit.   Electronically signed by: Karena LOISE Hurst, Scribe 09/18/2024 1:57 AM

## 2024-09-19 NOTE — ED Notes (Signed)
 RN rounded on pt to find an empty syringe in hand, O2 saturation at 76%, and pt obtunded and guppy breathing. Pt did not wake with sternal rub x3. Charge RN called into the room, NRB obtained, another sternal rub provided and pt opened eyes. Vape located on the floor. Security called to room. MD notified.    Harlene CHRISTELLA Einstein, RN 09/19/24 [phone]

## 2024-09-19 NOTE — ED Notes (Signed)
 Pt was escorted out and off of property by hospital security.  Pt was very belligerent and argumentative upon dismissal.       Sonny Carlin Borer, RN 09/19/24 [phone]

## 2024-09-19 NOTE — Care Plan (Signed)
   Problem: PAIN - ADULT Goal: Verbalizes/displays adequate comfort level or baseline comfort level Description: INTERVENTIONS: 1. Encourage pt to monitor pain and request assistance 2. Assess pain using appropriate pain scale 3. Administer analgesics based on type and severity of pain and evaluate response 4. Implement non-pharmacological measures as appropriate and evaluate response 5. Consider cultural and social influences on pain and pain management 6. Notify LIP if interventions unsuccessful or patient reports new pain Outcome: Progressing   Problem: INFECTION - ADULT Goal: Absence of infection during hospitalization Description: INTERVENTIONS: 1. Assess and monitor for signs and symptoms of infection 2. Monitor lab/diagnostic results 3. Monitor all insertion sites i.e., indwelling lines, tubes and drains 4. Monitor endotracheal (as able) and nasal secretions for changes in amount and color 5. Institute appropriate cooling/warming therapies per order 6. Administer medications as ordered 7. Instruct and encourage patient and family to use good hand hygiene technique 8. Identify and instruct in appropriate isolation precautions for identified infection/condition Outcome: Progressing Goal: Absence of fever/infection during anticipated neutropenic period Description: INTERVENTIONS 1. Monitor WBC 2. Administer growth factors as ordered 3. Implement neutropenic guidelines as ordered Outcome: Progressing   Problem: Safety - Adult Goal: Free from fall injury Description: INTERVENTIONS: 1. Assess pt frequently for physical needs 2. Identify cognitive and physical deficits and behaviors that affect risk of falls. 3. Institute fall precautions as indicated by assessment. 4. Educate pt/family on patient safety including physical limitations 5. Instruct pt to call for assistance with activity based on assessment 6. Modify environment to reduce risk of injury 7. Consider OT/PT consult  to assist with strengthening/mobility Outcome: Progressing Goal: Absence of infection during hospitalization Description: INTERVENTIONS: 1. Assess and monitor for signs and symptoms of infection 2. Monitor lab/diagnostic results 3. Monitor all insertion sites i.e., indwelling lines, tubes and drains 4. Monitor endotracheal (as able) and nasal secretions for changes in amount and color 5. Institute appropriate cooling/warming therapies per order 6. Administer medications as ordered 7. Instruct and encourage patient and family to use good hand hygiene technique 8. Identify and instruct in appropriate isolation precautions for identified infection/condition Outcome: Progressing

## 2024-09-19 NOTE — ED Notes (Signed)
 RN requested off duty [employer] to confirm belongings check with security. [employer] stated that security went through all belongings (multiple bags, blanket, pillow, and a duffle bag)    Harlene CHRISTELLA Einstein, RN 09/19/24 [phone]

## 2024-09-19 NOTE — Progress Notes (Signed)
 Pharmacy to Manage Vancomycin  Initiation Note Adult  Vancomycin  Consult Orders (From admission, onward)     Start     Ordered Stop   09/19/24 0007  Vancomycin  Pharmacy to Manage  miscellaneous,   Pharmacy to Manage       Question Answer Comment  Primary System Affected (Source): Bone/Joint   Type of Infection (Bone/Joint): Septic Arthritis   Patient Status: Non-Critically Ill      09/19/24 0007 --           Objective Weight: (!) 151 kg (332 lb), Ideal body weight: 63.8 kg (140 lb 10.5 oz) Adjusted ideal body weight: 98.5 kg (217 lb 3.1 oz),   Height: 1.676 m (5' 6) (09/18/2024  5:51 PM) Body mass index is 53.59 kg/m.  Serum creatinine: 1.22 mg/dL 88/95/74 8192 Estimated creatinine clearance: 105.6 mL/min Renal replacement therapy: No   Microbiology results: No results found for this visit on 09/18/24 (from the past 48 hours).  Vancomycin  Administrations (last 168 hours)     None       Assessment/Plan Briefly, Joshua Jacobs is a 48 y.o. male started on vancomycin  for Bone and Joint Septic Arthritis         .Patient did not receive a previous loading dose. Loading dose 2,000mg  will be ordered. Maintenance dose is 1,500mg  IVPB every 12 hours  off protocol -Using previous dosing history to guide dosing.  Pharmacy will continue to monitor patient (renal function, microbiology data, risk factors for adverse events, appropriateness of therapy), will order/monitor serum levels as appropriate, and will adjust dose if/when necessary.  Thank you, Donnice JONELLE Spore, [facility] 09/19/2024 12:25 AM

## 2024-09-21 ENCOUNTER — Emergency Department (HOSPITAL_COMMUNITY)

## 2024-09-21 ENCOUNTER — Other Ambulatory Visit: Payer: Self-pay

## 2024-09-21 ENCOUNTER — Encounter (HOSPITAL_COMMUNITY): Payer: Self-pay

## 2024-09-21 ENCOUNTER — Emergency Department (HOSPITAL_COMMUNITY)
Admission: EM | Admit: 2024-09-21 | Discharge: 2024-09-21 | Discharge status: Against Medical Advice | Attending: Emergency Medicine | Admitting: Emergency Medicine

## 2024-09-21 DIAGNOSIS — Z5329 Procedure and treatment not carried out because of patient's decision for other reasons: Secondary | ICD-10-CM | POA: Diagnosis not present

## 2024-09-21 DIAGNOSIS — M25551 Pain in right hip: Secondary | ICD-10-CM | POA: Diagnosis present

## 2024-09-21 DIAGNOSIS — M79604 Pain in right leg: Secondary | ICD-10-CM

## 2024-09-21 LAB — CBC
HCT: 27.6 % — ABNORMAL LOW (ref 39.0–52.0)
Hemoglobin: 7.7 g/dL — ABNORMAL LOW (ref 13.0–17.0)
MCH: 19.6 pg — ABNORMAL LOW (ref 26.0–34.0)
MCHC: 27.9 g/dL — ABNORMAL LOW (ref 30.0–36.0)
MCV: 70.2 fL — ABNORMAL LOW (ref 80.0–100.0)
Platelets: 558 K/uL — ABNORMAL HIGH (ref 150–400)
RBC: 3.93 MIL/uL — ABNORMAL LOW (ref 4.22–5.81)
RDW: 17.2 % — ABNORMAL HIGH (ref 11.5–15.5)
WBC: 11.2 K/uL — ABNORMAL HIGH (ref 4.0–10.5)
nRBC: 0 % (ref 0.0–0.2)

## 2024-09-21 LAB — COMPREHENSIVE METABOLIC PANEL WITH GFR
ALT: 17 U/L (ref 0–44)
AST: 18 U/L (ref 15–41)
Albumin: 3.3 g/dL — ABNORMAL LOW (ref 3.5–5.0)
Alkaline Phosphatase: 140 U/L — ABNORMAL HIGH (ref 38–126)
Anion gap: 9 (ref 5–15)
BUN: 21 mg/dL — ABNORMAL HIGH (ref 6–20)
CO2: 29 mmol/L (ref 22–32)
Calcium: 8.9 mg/dL (ref 8.9–10.3)
Chloride: 100 mmol/L (ref 98–111)
Creatinine, Ser: 0.88 mg/dL (ref 0.61–1.24)
GFR, Estimated: >60 mL/min (ref >60)
Glucose, Bld: 107 mg/dL — ABNORMAL HIGH (ref 70–99)
Potassium: 4.1 mmol/L (ref 3.5–5.1)
Sodium: 138 mmol/L (ref 135–145)
Total Bilirubin: 0.2 mg/dL (ref 0.0–1.2)
Total Protein: 7.6 g/dL (ref 6.5–8.1)

## 2024-09-21 LAB — I-STAT CG4 LACTIC ACID, ED: Lactic Acid, Venous: 1.2 mmol/L (ref 0.5–1.9)

## 2024-09-21 MED ORDER — IOHEXOL 350 MG/ML SOLN: 100.0000 mL | Freq: Once | INTRAVENOUS | Status: DC | PRN

## 2024-09-21 MED ORDER — IOHEXOL 300 MG/ML  SOLN
100.0000 mL | Freq: Once | INTRAMUSCULAR | Status: AC | PRN
  Administered 2024-09-21: 100 mL via INTRAVENOUS

## 2024-09-21 NOTE — ED Triage Notes (Signed)
 C/o right knee to thigh pain which 6 months, worsening over the last few weeks. Seen a few weeks ago at [facility] for same and had septic work up - positive blood cultures. Per the chart patient was admitted at [facility] on the 4th but states there was an issue there and he left AMA on the same day.

## 2024-09-21 NOTE — Discharge Instructions (Signed)
 As discussed, you will need the fluid drained around your hip joint soon.  Failure to do this can lead to complications. you can get this drained at [facility] where your surgery was done in the past. You can also come back to this hospital if you wish.  Seek emergency care with any new or worsening symptoms.
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
## 2024-09-26 LAB — CULTURE, BLOOD (ROUTINE X 2)
Culture: NO GROWTH
Special Requests: ADEQUATE
